# Patient Record
Sex: Female | Born: 2000
Health system: Southern US, Community
[De-identification: ages and names within clinical notes are randomized; demographics above are authoritative.]

## PROBLEM LIST (undated history)

## (undated) ENCOUNTER — Ambulatory Visit (HOSPITAL_COMMUNITY): Admission: EM | Payer: Self-pay

## (undated) DIAGNOSIS — F429 Obsessive-compulsive disorder, unspecified: Secondary | ICD-10-CM

## (undated) DIAGNOSIS — F32A Depression, unspecified: Secondary | ICD-10-CM

## (undated) DIAGNOSIS — F419 Anxiety disorder, unspecified: Secondary | ICD-10-CM

---
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
## 2018-07-21 ENCOUNTER — Ambulatory Visit: Payer: Self-pay | Admitting: Family Medicine

## 2018-08-04 DIAGNOSIS — E781 Pure hyperglyceridemia: Secondary | ICD-10-CM | POA: Insufficient documentation

## 2019-08-18 ENCOUNTER — Other Ambulatory Visit: Payer: Self-pay

## 2019-08-19 ENCOUNTER — Ambulatory Visit: Payer: BC Managed Care – PPO | Admitting: Family Medicine

## 2019-08-19 ENCOUNTER — Ambulatory Visit (INDEPENDENT_AMBULATORY_CARE_PROVIDER_SITE_OTHER): Payer: BC Managed Care – PPO

## 2019-08-19 ENCOUNTER — Encounter: Payer: Self-pay | Admitting: Family Medicine

## 2019-08-19 VITALS — BP 118/70 | HR 78 | Ht 61.0 in | Wt 128.5 lb

## 2019-08-19 DIAGNOSIS — M545 Low back pain: Secondary | ICD-10-CM

## 2019-08-19 DIAGNOSIS — G8929 Other chronic pain: Secondary | ICD-10-CM

## 2019-08-19 NOTE — Patient Instructions (Addendum)
Use heating pad 1-2x/day for 15-20 min on then off Exercises daily after heating pad - see below Take ibuprofen  (3  tab) twice per day x 7-10 days Xray today   Sciatica Rehab Ask your health care provider which exercises are safe for you. Do exercises exactly as told by your health care provider and adjust them as directed. It is normal to feel mild stretching, pulling, tightness, or discomfort as you do these exercises. Stop right away if you feel sudden pain or your pain gets worse. Do not begin these exercises until told by your health care provider. Stretching and range-of-motion exercises These exercises warm up your muscles and joints and improve the movement and flexibility of your hips and back. These exercises also help to relieve pain, numbness, and tingling. Sciatic nerve glide 1. Sit in a chair with your head facing down toward your chest. Place your hands behind your back. Let your shoulders slump forward. 2. Slowly straighten one of your legs while you tilt your head back as if you are looking toward the ceiling. Only straighten your leg as far as you can without making your symptoms worse. 3. Hold this position for __________ seconds. 4. Slowly return to the starting position. 5. Repeat with your other leg. Repeat __________ times. Complete this exercise __________ times a day. Knee to chest with hip adduction and internal rotation  1. Lie on your back on a firm surface with both legs straight. 2. Bend one of your knees and move it up toward your chest until you feel a gentle stretch in your lower back and buttock. Then, move your knee toward the shoulder that is on the opposite side from your leg. This is hip adduction and internal rotation. ? Hold your leg in this position by holding on to the front of your knee. 3. Hold this position for __________ seconds. 4. Slowly return to the starting position. 5. Repeat with your other leg. Repeat __________ times. Complete  this exercise __________ times a day. Prone extension on elbows  1. Lie on your abdomen on a firm surface. A bed may be too soft for this exercise. 2. Prop yourself up on your elbows. 3. Use your arms to help lift your chest up until you feel a gentle stretch in your abdomen and your lower back. ? This will place some of your body weight on your elbows. If this is uncomfortable, try stacking pillows under your chest. ? Your hips should stay down, against the surface that you are lying on. Keep your hip and back muscles relaxed. 4. Hold this position for __________ seconds. 5. Slowly relax your upper body and return to the starting position. Repeat __________ times. Complete this exercise __________ times a day. Strengthening exercises These exercises build strength and endurance in your back. Endurance is the ability to use your muscles for a long time, even after they get tired. Pelvic tilt This exercise strengthens the muscles that lie deep in the abdomen. 1. Lie on your back on a firm surface. Bend your knees and keep your feet flat on the floor. 2. Tense your abdominal muscles. Tip your pelvis up toward the ceiling and flatten your lower back into the floor. ? To help with this exercise, you may place a small towel under your lower back and try to push your back into the towel. 3. Hold this position for __________ seconds. 4. Let your muscles relax completely before you repeat this exercise. Repeat __________ times. Complete this  exercise __________ times a day. Alternating arm and leg raises  1. Get on your hands and knees on a firm surface. If you are on a hard floor, you may want to use padding, such as an exercise mat, to cushion your knees. 2. Line up your arms and legs. Your hands should be directly below your shoulders, and your knees should be directly below your hips. 3. Lift your left leg behind you. At the same time, raise your right arm and straighten it in front of  you. ? Do not lift your leg higher than your hip. ? Do not lift your arm higher than your shoulder. ? Keep your abdominal and back muscles tight. ? Keep your hips facing the ground. ? Do not arch your back. ? Keep your balance carefully, and do not hold your breath. 4. Hold this position for __________ seconds. 5. Slowly return to the starting position. 6. Repeat with your right leg and your left arm. Repeat __________ times. Complete this exercise __________ times a day. Posture and body mechanics Good posture and healthy body mechanics can help to relieve stress in your body's tissues and joints. Body mechanics refers to the movements and positions of your body while you do your daily activities. Posture is part of body mechanics. Good posture means:  Your spine is in its natural S-curve position (neutral).  Your shoulders are pulled back slightly.  Your head is not tipped forward. Follow these guidelines to improve your posture and body mechanics in your everyday activities. Standing   When standing, keep your spine neutral and your feet about hip width apart. Keep a slight bend in your knees. Your ears, shoulders, and hips should line up.  When you do a task in which you stand in one place for a long time, place one foot up on a stable object that is 2-4 inches (5-10 cm) high, such as a footstool. This helps keep your spine neutral. Sitting   When sitting, keep your spine neutral and keep your feet flat on the floor. Use a footrest, if necessary, and keep your thighs parallel to the floor. Avoid rounding your shoulders, and avoid tilting your head forward.  When working at a desk or a computer, keep your desk at a height where your hands are slightly lower than your elbows. Slide your chair under your desk so you are close enough to maintain good posture.  When working at a computer, place your monitor at a height where you are looking straight ahead and you do not have to tilt  your head forward or downward to look at the screen. Resting  When lying down and resting, avoid positions that are most painful for you.  If you have pain with activities such as sitting, bending, stooping, or squatting, lie in a position in which your body does not bend very much. For example, avoid curling up on your side with your arms and knees near your chest (fetal position).  If you have pain with activities such as standing for a long time or reaching with your arms, lie with your spine in a neutral position and bend your knees slightly. Try the following positions: ? Lying on your side with a pillow between your knees. ? Lying on your back with a pillow under your knees. Lifting   When lifting objects, keep your feet at least shoulder width apart and tighten your abdominal muscles.  Bend your knees and hips and keep your spine neutral. It is  important to lift using the strength of your legs, not your back. Do not lock your knees straight out.  Always ask for help to lift heavy or awkward objects. This information is not intended to replace advice given to you by your health care provider. Make sure you discuss any questions you have with your health care provider. Document Released: 10/08/2005 Document Revised: 01/30/2019 Document Reviewed: 10/30/2018 Elsevier Patient Education  2020 [employer].

## 2019-08-19 NOTE — Progress Notes (Signed)
Eileen Rivas is a 18 y.o. female  Chief Complaint  Patient presents with  . Establish Care  . Back Pain    HPI: Eileen Rivas is a 18 y.o. female here as a new patient to establish care with our office. She is accompanied by her mother. Previous PCP at [facility].  Pt complains of 2 year h/o left low back pain. Pain started when patient tried to pick an elderly female woman whom her grandmother was caring for up off of the ground. She describes pain as a dull stabbing pain. Pain is worse with heavy lifting or the more active patient is. Pain does not keep pt from sleeping or interfere with ADL's.  No numbness or tingling in Lt leg. No LLE weakness. No falls. She has taken ibuprofen as needed, mostly when she was working last year for 2 mo at [employer].  She has not seen provider for this.   History reviewed. No pertinent past medical history.  History reviewed. No pertinent surgical history.  Social History   Socioeconomic History  . Marital status: Single    Spouse name: Not on file  . Number of children: Not on file  . Years of education: Not on file  . Highest education level: Not on file  Occupational History  . Not on file  Social Needs  . Financial resource strain: Not on file  . Food insecurity    Worry: Not on file    Inability: Not on file  . Transportation needs    Medical: Not on file    Non-medical: Not on file  Tobacco Use  . Smoking status: Never Smoker  . Smokeless tobacco: Never Used  Substance and Sexual Activity  . Alcohol use: No  . Drug use: No  . Sexual activity: Not on file  Lifestyle  . Physical activity    Days per week: Not on file    Minutes per session: Not on file  . Stress: Not on file  Relationships  . Social Musician on phone: Not on file    Gets together: Not on file    Attends religious service: Not on file    Active member of club or organization: Not on file    Attends meetings of clubs or  organizations: Not on file    Relationship status: Not on file  . Intimate partner violence    Fear of current or ex partner: Not on file    Emotionally abused: Not on file    Physically abused: Not on file    Forced sexual activity: Not on file  Other Topics Concern  . Not on file  Social History Narrative  . Not on file    History reviewed. No pertinent family history.    There is no immunization history on file for this patient.  No outpatient encounter medications on file as of 08/19/2019.   No facility-administered encounter medications on file as of 08/19/2019.      ROS: Pertinent positives and negatives noted in HPI. Remainder of ROS non-contributory   No Known Allergies  BP 118/70   Pulse 78   Ht 5\' 1"  (1.549 m)   Wt 128 lb 8 oz (58.3 kg)   SpO2 97%   BMI 24.28 kg/m   Physical Exam  Constitutional: She is oriented to person, place, and time. She appears well-developed and well-nourished. No distress.  Abdominal: Soft. Bowel sounds are normal. She exhibits no distension. There is no  abdominal tenderness. There is no CVA tenderness.  Musculoskeletal: Normal range of motion.        General: No edema.     Comments: Back: normal and full ROM, neg SLR B/L, + TTP over Lt sciatic notch, no palpable muscle hypertonicity, no gross abnormalities on inspection  Neurological: She is alert and oriented to person, place, and time. She has normal reflexes. She exhibits normal muscle tone. Coordination normal.  Psychiatric: She has a normal mood and affect. Her behavior is normal.     A/P:  1. Chronic left-sided low back pain without sciatica - heating pad BID - exercises daily - see AVS - ibuprofen 600mg  BID with food x 7-10 days - DG Lumbar Spine Complete; Future - DG Sacrum/Coccyx; Future - f/u in 3-4 wks if no/minimal improvement in symptoms Discussed plan and reviewed medications with patient, including risks, benefits, and potential side effects. Pt expressed  understand. All questions answered.

## 2019-08-20 ENCOUNTER — Encounter (HOSPITAL_COMMUNITY): Payer: Self-pay

## 2019-08-20 ENCOUNTER — Emergency Department (HOSPITAL_COMMUNITY)
Admission: EM | Admit: 2019-08-20 | Discharge: 2019-08-20 | Disposition: A | Discharge status: Home / Self Care | Payer: BC Managed Care – PPO | Attending: Emergency Medicine | Admitting: Emergency Medicine

## 2019-08-20 ENCOUNTER — Other Ambulatory Visit: Payer: Self-pay

## 2019-08-20 DIAGNOSIS — R519 Headache, unspecified: Secondary | ICD-10-CM | POA: Insufficient documentation

## 2019-08-20 DIAGNOSIS — Z20828 Contact with and (suspected) exposure to other viral communicable diseases: Secondary | ICD-10-CM | POA: Diagnosis not present

## 2019-08-20 MED ORDER — IBUPROFEN 400 MG PO TABS: 400.0000 mg | ORAL_TABLET | Freq: Four times a day (QID) | ORAL | 0 refills | Status: DC | PRN

## 2019-08-20 MED ORDER — ACETAMINOPHEN 325 MG PO TABS: 650.0000 mg | ORAL_TABLET | Freq: Once | ORAL | Status: DC

## 2019-08-20 NOTE — ED Triage Notes (Signed)
Pt reports sore throat and right sided headache that started today.

## 2019-08-20 NOTE — Discharge Instructions (Addendum)
We saw you in the ER for headaches. We are not sure what is causing your headaches, however, there appears to be no evidence of infection, bleeds or tumors based on our exam and results.  Please take motrin round the clock for the next 48 hours, and take other meds prescribed only for break through pain. See your doctor if the pain persists, as you might need better medications or a specialist.  Please return to the ER if the headache gets severe and in not improving, you have associated new one sided numbness, tingling, weakness or confusion, seizures, poor balance or poor vision.

## 2019-08-20 NOTE — ED Notes (Signed)
Pt dc'd home w/all belongings, a/o x4, ambulatory upon dc 

## 2019-08-21 ENCOUNTER — Ambulatory Visit: Payer: Self-pay

## 2019-08-21 NOTE — ED Provider Notes (Signed)
[facility] EMERGENCY DEPARTMENT Provider Note   CSN: [id number] Arrival date & time: 08/20/19  2053     History   Chief Complaint Chief Complaint  Patient presents with  . Sore Throat  . Headache    HPI Eileen Rivas is a 18 y.o. female.     HPI 18 year old comes in a chief complaint of sore throat and headache.  Patient has no significant medical history.  She reports that she has been having headache off and on for the last 3 days.  She is also having sore throat with it.  Patient denies any cough, chest pain, shortness of breath, body aches, nausea, vomiting, fevers, chills.  There is no associated focal numbness, weakness with a headache.  Family history is negative for any aneurysm, brain bleed or tumor.  Her headache is not positional, exertional and described as 8 out of 10 pain at its peak which last for about 1 to 2 hours.  Patient denies any substance abuse.  She denies any sick contacts.  History reviewed. No pertinent past medical history.  There are no active problems to display for this patient.   History reviewed. No pertinent surgical history.   OB History   No obstetric history on file.      Home Medications    Prior to Admission medications   Medication Sig Start Date End Date Taking? Authorizing Provider  ibuprofen (ADVIL) 400 MG tablet Take 1 tablet (400 mg total) by mouth every 6 (six) hours as needed. 08/20/19   Derwood Kaplan, MD    Family History No family history on file.  Social History Social History   Tobacco Use  . Smoking status: Never Smoker  . Smokeless tobacco: Never Used  Substance Use Topics  . Alcohol use: No  . Drug use: No     Allergies   Patient has no known allergies.   Review of Systems Review of Systems  Constitutional: Negative for activity change.  HENT: Positive for sore throat.   Respiratory: Negative for cough and shortness of breath.   Cardiovascular: Negative for chest pain.   Gastrointestinal: Negative for nausea and vomiting.  Neurological: Positive for headaches.     Physical Exam Updated Vital Signs BP 92/60   Pulse 67   Temp 98.5 F (36.9 C) (Oral)   Resp 16   LMP 08/06/2019   SpO2 100%   Physical Exam Vitals signs and nursing note reviewed.  Constitutional:      Appearance: She is well-developed.  HENT:     Head: Normocephalic and atraumatic.     Mouth/Throat:     Tonsils: No tonsillar exudate or tonsillar abscesses.  Neck:     Musculoskeletal: Normal range of motion and neck supple.  Cardiovascular:     Rate and Rhythm: Normal rate.     Heart sounds: No murmur.  Pulmonary:     Effort: Pulmonary effort is normal.  Abdominal:     General: Bowel sounds are normal.  Skin:    General: Skin is warm and dry.  Neurological:     General: No focal deficit present.     Mental Status: She is alert and oriented to person, place, and time.     Comments: Cerebellar exam is normal (finger to nose) Sensory exam normal for bilateral upper and lower extremities - and patient is able to discriminate between sharp and dull. Motor exam is 4+/5       ED Treatments / Results  Labs (all labs ordered  are listed, but only abnormal results are displayed) Labs Reviewed - No data to display  EKG None  Radiology No results found.  Procedures Procedures (including critical care time)  Medications Ordered in ED Medications - No data to display   Initial Impression / Assessment and Plan / ED Course  I have reviewed the triage vital signs and the nursing notes.  Pertinent labs & imaging results that were available during my care of the patient were reviewed by me and considered in my medical decision making (see chart for details).        18 year old comes in a chief complaint of intermittent headaches.  She has not taken any medications for the headache, and history is not overly concerning as the symptoms are intermittent, with no specific  evoking, aggravating or relieving factors.  She has no red flags in the history suggesting elevated ICP.  Family history and social history are also reassuring.  Neuro exam is nonfocal.  We checked patient's throat, and there is no exudate, erythema.  I do not think patient has strep throat.  We discussed COVID-19 testing but patient reports that she has been staying home and her family members have gone in and out to work but not been exposed to anyone with COVID-19.  She does not want to be tested.  Eileen Rivas was evaluated in Emergency Department on 08/21/2019 for the symptoms described in the history of present illness. She was evaluated in the context of the global COVID-19 pandemic, which necessitated consideration that the patient might be at risk for infection with the SARS-CoV-2 virus that causes COVID-19. Institutional protocols and algorithms that pertain to the evaluation of patients at risk for COVID-19 are in a state of rapid change based on information released by regulatory bodies including the CDC and federal and state organizations. These policies and algorithms were followed during the patient's care in the ED.   Final Clinical Impressions(s) / ED Diagnoses   Final diagnoses:  Intermittent headache    ED Discharge Orders         Ordered    ibuprofen (ADVIL) 400 MG tablet  Every 6 hours PRN     08/20/19 2200           Varney Biles, MD 08/21/19 2214

## 2019-08-21 NOTE — Telephone Encounter (Signed)
Patient called stating that yesterday she woke up and had pain,pin and needles and sharp to her rt face jaw head and throat.  She states that she went to the ER but they sent her home.  She denies swelling or rash to the right side of her face. She does not have swollen lymph nodes. She denies chest pain.She denies tooth pain. Mother is also on call and she states that she has just recently been seen in office for back pain. Patient states she had ibuprofen 400mg  she is taking for pain. Care advice read to patient. Appointment scheduled for Saturday clinic. Patient understands that she will be contacted in the AM with instructions for the visit. Patient verbalized understanding of all information.  Reason for Disposition . Face pain present > 24 hours  Answer Assessment - Initial Assessment Questions 1. ONSET: "When did the pain start?" (e.g., minutes, hours, days)     yesterday 2. ONSET: "Does the pain come and go, or has it been constant since it started?" (e.g., constant, intermittent, fleeting)    Pain comes and goes jaw and throat are constant 3. SEVERITY: "How bad is the pain?"   (Scale 1-10; mild, moderate or severe)   - MILD (1-3): doesn't interfere with normal activities    - MODERATE (4-7): interferes with normal activities or awakens from sleep    - SEVERE (8-10): excruciating pain, unable to do any normal activities      8 to head but quick face pain 7 and last 1 minute jaw and throat when swallow 4. LOCATION: "Where does it hurt?"      Rt face jaw throat head 5. RASH: "Is there any redness, rash, or swelling of the face?"    denies 6. FEVER: "Do you have a fever?" If so, ask: "What is it, how was it measured, and when did it start?"     denies 7. OTHER SYMPTOMS: "Do you have any other symptoms?" (e.g., fever, toothache, nasal discharge, nasal congestion, clicking sensation in jaw joint)    none 8. PREGNANCY: "Is there any chance you are pregnant?" "When was your last menstrual  period?"     2weeks ago  Protocols used: FACE PAIN-A-AH

## 2019-08-22 ENCOUNTER — Encounter: Payer: Self-pay | Admitting: Family Medicine

## 2019-08-22 ENCOUNTER — Ambulatory Visit (INDEPENDENT_AMBULATORY_CARE_PROVIDER_SITE_OTHER): Payer: BC Managed Care – PPO | Admitting: Family Medicine

## 2019-08-22 VITALS — Ht 61.0 in

## 2019-08-22 DIAGNOSIS — G500 Trigeminal neuralgia: Secondary | ICD-10-CM | POA: Diagnosis not present

## 2019-08-22 MED ORDER — PREDNISONE 20 MG PO TABS: 20.0000 mg | ORAL_TABLET | Freq: Every day | ORAL | 0 refills | Status: DC

## 2019-08-22 NOTE — Progress Notes (Signed)
   VIRTUAL VISIT VIA VIDEO  I connected with Eileen Rivas on 08/22/19 at  9:00 AM EDT by a video enabled telemedicine application and verified that I am speaking with the correct person using two identifiers. Location patient: Home Location provider: [place] participating in the virtual visit: Patient, Dr. Raoul Pitch and R.Baker, LPN  I discussed the limitations of evaluation and management by telemedicine and the availability of in person appointments. The patient expressed understanding and agreed to proceed.   SUBJECTIVE Chief Complaint  Patient presents with  . Facial Pain    Right sided facial pain x1 day. Denies injury.     HPI: Eileen Rivas is a 18 y.o. female present at after hours clinic for right sided facial pain. Patient denies injury. She states she has never had a pain like this before. The pain started 3 days ago. She feels it is a little better today, but still present. She reports the pain is like a flash of pain that is intermittent. It is not painful to touch the area or open or her jaw. She denies rash, but reports it hurt to brush her hair over the temporal aspect on the right. She denies teeth pain, gum swelling, fever, chills, ear pain, sore throat, headache, nausea or vomit. She does have braces and reports tightening 2 weeks ago. She denies any cuts or sores in her mouth.   ROS: See pertinent positives and negatives per HPI.  There are no active problems to display for this patient.   Social History   Tobacco Use  . Smoking status: Never Smoker  . Smokeless tobacco: Never Used  Substance Use Topics  . Alcohol use: No    Current Outpatient Medications:  .  ibuprofen (ADVIL) 400 MG tablet, Take 1 tablet (400 mg total) by mouth every 6 (six) hours as needed., Disp: 20 tablet, Rfl: 0  No Known Allergies  OBJECTIVE: LMP 08/06/2019  Gen: No acute distress. Nontoxic in appearance.  HENT: AT. [place].  MMM.  Eyes:Pupils Equal Round Reactive to  light, Extraocular movements intact,  Conjunctiva without redness, discharge or icterus. Chest: no  Cough or shortness of breath Skin: no rashes, purpura or petechiae. No erythema. Possibly mild swelling right cheek vs facial natural asymmetry. Full extension of jaw without popping, deviation or pain. Not TTP over cheek.   Neuro:  Alert. Oriented x3   ASSESSMENT AND PLAN: Eileen Rivas is a 18 y.o. female present for  Trigeminal neuralgia Pt symptoms are consistent with trigeminal neuralgia. She has no infectious signs today and no rash. Questioned possibly mild fullness below zygomatic arch, however pt states she feels her face appears larger on that side> poss natural facial asymmetry. Discussed trigiminal neuralgia and use of prednisone for 5 days.  Tylenol/advil OTC for discomfort.  Discussed monitoring for any rash development> in the event this could be shingles, and report immediately to be seen if so. Pt reported understanding.  F/U with pcp next week.   > 15 minutes spent with patient, > 50% of that time face to face    Howard Pouch, DO 08/22/2019

## 2019-08-22 NOTE — Patient Instructions (Signed)
I have called in the prednisone 5 day course.  Use advil for pain.  Call your doctor on Monday to follow up. Follow up sooner if you notice a rash appear.   Trigeminal Neuralgia  Trigeminal neuralgia is a nerve disorder that causes severe pain on one side of the face. The pain may last from a few seconds to several minutes. The pain is usually only on one side of the face. Symptoms may occur for days, weeks, or months and then go away for months or years. The pain may return and be worse than before. What are the causes? This condition is caused by damage or pressure to a nerve in the head that is called the trigeminal nerve. An attack can be triggered by:  Talking.  Chewing.  Putting on makeup.  Washing your face.  Shaving your face.  Brushing your teeth.  Touching your face. What increases the risk? You are more likely to develop this condition if you:  Are 29 years of age or older.  Are female. What are the signs or symptoms? The main symptom of this condition is severe pain in the:  Jaw.  Lips.  Eyes.  Nose.  Scalp.  Forehead.  Face. The pain may be:  Intense.  Stabbing.  Electric.  Shock-like. How is this diagnosed? This condition is diagnosed with a physical exam. A CT scan or an MRI may be done to rule out other conditions that can cause facial pain. How is this treated? This condition may be treated with:  Avoiding the things that trigger your symptoms.  Taking prescription medicines (anticonvulsants).  Having surgery. This may be done in severe cases if other medical treatment does not provide relief.  Having procedures such as ablation, thermal, or radiation therapy. It may take up to one month for treatment to start relieving the pain. Follow these instructions at home: Managing pain  Learn as much as you can about how to manage your pain. Ask your health care provider if a pain specialist would be helpful.  Consider talking with a  mental health care provider (psychologist) about how to cope with the pain.  Consider joining a pain support group. General instructions  Take over-the-counter and prescription medicines only as told by your health care provider.  Avoid the things that trigger your symptoms. It may help to: ? Chew on the unaffected side of your mouth. ? Avoid touching your face. ? Avoid blasts of hot or cold air.  Follow your treatment plan as told by your health care provider. This may include: ? Cognitive or behavioral therapy. ? Gentle, regular exercise. ? Meditation or yoga. ? Aromatherapy.  Keep all follow-up visits as told by your health care provider. You may need to be monitored closely to make sure treatment is working well for you. Where to find more information  Facial Pain Association: fpa-support.org Contact a health care provider if:  Your medicine is not helping your symptoms.  You have side effects from the medicine used for treatment.  You develop new, unexplained symptoms, such as: ? Double vision. ? Facial weakness. ? Facial numbness. ? Changes in hearing or balance.  You feel depressed. Get help right away if:  Your pain is severe and is not getting better.  You develop suicidal thoughts. If you ever feel like you may hurt yourself or others, or have thoughts about taking your own life, get help right away. You can go to your nearest emergency department or call:  Your  local emergency services (911 in the [place]  A suicide crisis helpline, such as the [place] at 650-734-4243. This is open 24 hours a day. Summary  Trigeminal neuralgia is a nerve disorder that causes severe pain on one side of the face. The pain may last from a few seconds to several minutes.  This condition is caused by damage or pressure to a nerve in the head that is called the trigeminal nerve.  Treatment may include avoiding the things that trigger your symptoms,  taking medicines, or having surgery or procedures. It may take up to one month for treatment to start relieving the pain.  Avoid the things that trigger your symptoms.  Keep all follow-up visits as told by your health care provider. You may need to be monitored closely to make sure treatment is working well for you. This information is not intended to replace advice given to you by your health care provider. Make sure you discuss any questions you have with your health care provider. Document Released: 10/05/2000 Document Revised: 08/25/2018 Document Reviewed: 08/25/2018 Elsevier Patient Education  2020 [employer].

## 2019-09-15 ENCOUNTER — Other Ambulatory Visit: Payer: Self-pay

## 2019-09-15 ENCOUNTER — Encounter: Payer: Self-pay | Admitting: Family Medicine

## 2019-09-15 ENCOUNTER — Ambulatory Visit (INDEPENDENT_AMBULATORY_CARE_PROVIDER_SITE_OTHER): Payer: BC Managed Care – PPO | Admitting: Family Medicine

## 2019-09-15 VITALS — Ht 61.0 in

## 2019-09-15 DIAGNOSIS — N3090 Cystitis, unspecified without hematuria: Secondary | ICD-10-CM

## 2019-09-15 DIAGNOSIS — R300 Dysuria: Secondary | ICD-10-CM

## 2019-09-15 LAB — POCT URINALYSIS DIPSTICK
Bilirubin, UA: NEGATIVE
Blood, UA: NEGATIVE
Glucose, UA: NEGATIVE
Ketones, UA: NEGATIVE
Leukocytes, UA: NEGATIVE
Nitrite, UA: POSITIVE
Protein, UA: NEGATIVE
Spec Grav, UA: 1.025 (ref 1.010–1.025)
Urobilinogen, UA: 0.2 E.U./dL
pH, UA: 6 (ref 5.0–8.0)

## 2019-09-15 MED ORDER — CEPHALEXIN 500 MG PO CAPS: 500.0000 mg | ORAL_CAPSULE | Freq: Two times a day (BID) | ORAL | 0 refills | Status: AC

## 2019-09-15 NOTE — Progress Notes (Signed)
Eileen Rivas - 18 y.o. female MRN [id number]  Date of birth: 07-30-2001   This visit type was conducted due to national recommendations for restrictions regarding the COVID-19 Pandemic (e.g. social distancing).  This format is felt to be most appropriate for this patient at this time.  All issues noted in this document were discussed and addressed.  No physical exam was performed (except for noted visual exam findings with Video Visits).  I discussed the limitations of evaluation and management by telemedicine and the availability of in person appointments. The patient expressed understanding and agreed to proceed.  I connected with@ on 09/15/19 at  3:00 PM EST by a video enabled telemedicine application and verified that I am speaking with the correct person using two identifiers.  Present at visit: Luetta Nutting, DO Julieta Gutting   Patient [place] [address]   Provider location:   [place]  Chief Complaint  Patient presents with  . Urinary Tract Infection    pt is having lower abdominal pain & pain after urinating for about 3 days.    HPI  Eileen Rivas is a 18 y.o. female who presents via audio/video conferencing for a telehealth visit today.  She has complaint of dysuria and lower abdominal pain.  She also reports urinary frequency. Her symptoms started ~3 days ago.  She denies flank pain, fever, nausea, hematuria, vaginal discharge or abnormal bleeding.  She has had a UTI in the past and symptoms are similar.    ROS:  A comprehensive ROS was completed and negative except as noted per HPI  History reviewed. No pertinent past medical history.  History reviewed. No pertinent surgical history.  History reviewed. No pertinent family history.  Social History   Socioeconomic History  . Marital status: Single    Spouse name: Not on file  . Number of children: Not on file  . Years of education: Not on file  . Highest  education level: Not on file  Occupational History  . Not on file  Social Needs  . Financial resource strain: Not on file  . Food insecurity    Worry: Not on file    Inability: Not on file  . Transportation needs    Medical: Not on file    Non-medical: Not on file  Tobacco Use  . Smoking status: Never Smoker  . Smokeless tobacco: Never Used  Substance and Sexual Activity  . Alcohol use: No  . Drug use: No  . Sexual activity: Not on file  Lifestyle  . Physical activity    Days per week: Not on file    Minutes per session: Not on file  . Stress: Not on file  Relationships  . Social Herbalist on phone: Not on file    Gets together: Not on file    Attends religious service: Not on file    Active member of club or organization: Not on file    Attends meetings of clubs or organizations: Not on file    Relationship status: Not on file  . Intimate partner violence    Fear of current or ex partner: Not on file    Emotionally abused: Not on file    Physically abused: Not on file    Forced sexual activity: Not on file  Other Topics Concern  . Not on file  Social History Narrative  . Not on file     Current Outpatient Medications:  .  ibuprofen (  ADVIL) 400 MG tablet, Take 1 tablet (400 mg total) by mouth every 6 (six) hours as needed., Disp: 20 tablet, Rfl: 0 .  predniSONE (DELTASONE) 20 MG tablet, Take 1 tablet (20 mg total) by mouth daily with breakfast., Disp: 10 tablet, Rfl: 0 .  cephALEXin (KEFLEX) 500 MG capsule, Take 1 capsule (500 mg total) by mouth 2 (two) times daily for 7 days., Disp: 14 capsule, Rfl: 0  EXAM:  VITALS per patient if applicable: Ht 5\' 1"  (1.549 m)   BMI 24.28 kg/m   GENERAL: alert, oriented, appears well and in no acute distress  HEENT: atraumatic, conjunttiva clear, no obvious abnormalities on inspection of external nose and ears  NECK: normal movements of the head and neck  LUNGS: on inspection no signs of respiratory distress,  breathing rate appears normal, no obvious gross SOB, gasping or wheezing  CV: no obvious cyanosis  MS: moves all visible extremities without noticeable abnormality  PSYCH/NEURO: pleasant and cooperative, no obvious depression or anxiety, speech and thought processing grossly intact  ASSESSMENT AND PLAN:  Discussed the following assessment and plan:  Cystitis -She will stop by the clinic to provide urine sample to send for culture.  Denies other symptoms suggestive of STI.  -Will cover empirically with cephalexin.  Recommend increased fluid intake.   -She is instructed to call if she develops new or worsening symptoms.      I discussed the assessment and treatment plan with the patient. The patient was provided an opportunity to ask questions and all were answered. The patient agreed with the plan and demonstrated an understanding of the instructions.   The patient was advised to call back or seek an in-person evaluation if the symptoms worsen or if the condition fails to improve as anticipated.    , DO

## 2019-09-15 NOTE — Assessment & Plan Note (Signed)
-  She will stop by the clinic to provide urine sample to send for culture.  Denies other symptoms suggestive of STI.  -Will cover empirically with cephalexin.  Recommend increased fluid intake.   -She is instructed to call if she develops new or worsening symptoms.

## 2019-09-15 NOTE — Addendum Note (Signed)
Addended by: Lynnea Ferrier on: 09/15/2019 04:43 PM   Modules accepted: Orders

## 2019-09-16 ENCOUNTER — Telehealth: Payer: Self-pay | Admitting: Family Medicine

## 2019-09-16 NOTE — Telephone Encounter (Signed)
Copied from [place] [id number]. Topic: General - Other >> Sep 16, 2019 12:42 PM Keene Breath wrote: Reason for CRM: Mother called to get the results of the patient's urine test.  Please call at [phone]

## 2019-09-18 LAB — URINE CULTURE
MICRO NUMBER:: 1134709
SPECIMEN QUALITY:: ADEQUATE

## 2019-09-21 NOTE — Progress Notes (Signed)
Please let patient know that she should complete her current antibiotic.  If symptoms persist let us know.

## 2019-09-24 ENCOUNTER — Ambulatory Visit: Payer: Medicaid Other | Admitting: Internal Medicine

## 2020-05-02 ENCOUNTER — Encounter (HOSPITAL_COMMUNITY): Payer: Self-pay

## 2020-05-02 ENCOUNTER — Ambulatory Visit (HOSPITAL_COMMUNITY)
Admission: EM | Admit: 2020-05-02 | Discharge: 2020-05-03 | Disposition: A | Discharge status: Other Acute Inpatient Hospital | Payer: No Payment, Other | Attending: Psychiatry | Admitting: Psychiatry

## 2020-05-02 ENCOUNTER — Other Ambulatory Visit: Payer: Self-pay

## 2020-05-02 DIAGNOSIS — F22 Delusional disorders: Secondary | ICD-10-CM | POA: Insufficient documentation

## 2020-05-02 DIAGNOSIS — F422 Mixed obsessional thoughts and acts: Secondary | ICD-10-CM | POA: Insufficient documentation

## 2020-05-02 DIAGNOSIS — Z20822 Contact with and (suspected) exposure to covid-19: Secondary | ICD-10-CM | POA: Insufficient documentation

## 2020-05-02 DIAGNOSIS — F419 Anxiety disorder, unspecified: Secondary | ICD-10-CM | POA: Insufficient documentation

## 2020-05-02 DIAGNOSIS — F429 Obsessive-compulsive disorder, unspecified: Secondary | ICD-10-CM

## 2020-05-02 HISTORY — DX: Anxiety disorder, unspecified: F41.9

## 2020-05-02 HISTORY — DX: Depression, unspecified: F32.A

## 2020-05-02 LAB — COMPREHENSIVE METABOLIC PANEL
ALT: 10 U/L (ref 0–44)
AST: 21 U/L (ref 15–41)
Albumin: 3.9 g/dL (ref 3.5–5.0)
Alkaline Phosphatase: 74 U/L (ref 38–126)
Anion gap: 9 (ref 5–15)
BUN: 6 mg/dL (ref 6–20)
CO2: 20 mmol/L — ABNORMAL LOW (ref 22–32)
Calcium: 8.9 mg/dL (ref 8.9–10.3)
Chloride: 108 mmol/L (ref 98–111)
Creatinine, Ser: 0.48 mg/dL (ref 0.44–1.00)
GFR calc Af Amer: >60 mL/min (ref >60)
GFR calc non Af Amer: >60 mL/min (ref >60)
Glucose, Bld: 94 mg/dL (ref 70–99)
Potassium: 3.6 mmol/L (ref 3.5–5.1)
Sodium: 137 mmol/L (ref 135–145)
Total Bilirubin: 0.5 mg/dL (ref 0.3–1.2)
Total Protein: 7.2 g/dL (ref 6.5–8.1)

## 2020-05-02 LAB — CBC WITH DIFFERENTIAL/PLATELET
Abs Immature Granulocytes: 0.02 10*3/uL (ref 0.00–0.07)
Basophils Absolute: 0 10*3/uL (ref 0.0–0.1)
Basophils Relative: 0 %
Eosinophils Absolute: 0.1 10*3/uL (ref 0.0–0.5)
Eosinophils Relative: 1 %
HCT: 38 % (ref 36.0–46.0)
Hemoglobin: 11.9 g/dL — ABNORMAL LOW (ref 12.0–15.0)
Immature Granulocytes: 0 %
Lymphocytes Relative: 28 %
Lymphs Abs: 2.1 10*3/uL (ref 0.7–4.0)
MCH: 27.4 pg (ref 26.0–34.0)
MCHC: 31.3 g/dL (ref 30.0–36.0)
MCV: 87.4 fL (ref 80.0–100.0)
Monocytes Absolute: 0.7 10*3/uL (ref 0.1–1.0)
Monocytes Relative: 10 %
Neutro Abs: 4.5 10*3/uL (ref 1.7–7.7)
Neutrophils Relative %: 61 %
Platelets: 278 10*3/uL (ref 150–400)
RBC: 4.35 MIL/uL (ref 3.87–5.11)
RDW: 14 % (ref 11.5–15.5)
WBC: 7.5 10*3/uL (ref 4.0–10.5)
nRBC: 0 % (ref 0.0–0.2)

## 2020-05-02 LAB — POCT URINE DRUG SCREEN - MANUAL ENTRY (I-SCREEN)
POC Amphetamine UR: NOT DETECTED
POC Buprenorphine (BUP): NOT DETECTED
POC Cocaine UR: NOT DETECTED
POC Marijuana UR: NOT DETECTED
POC Methadone UR: NOT DETECTED
POC Methamphetamine UR: NOT DETECTED
POC Morphine: NOT DETECTED
POC Oxazepam (BZO): NOT DETECTED
POC Oxycodone UR: NOT DETECTED
POC Secobarbital (BAR): NOT DETECTED

## 2020-05-02 LAB — SARS CORONAVIRUS 2 BY RT PCR (HOSPITAL ORDER, PERFORMED IN ~~LOC~~ HOSPITAL LAB): SARS Coronavirus 2: NEGATIVE

## 2020-05-02 LAB — ETHANOL: Alcohol, Ethyl (B): <10 mg/dL (ref <10)

## 2020-05-02 LAB — POC SARS CORONAVIRUS 2 AG: SARS Coronavirus 2 Ag: NEGATIVE

## 2020-05-02 LAB — POCT PREGNANCY, URINE: Preg Test, Ur: NEGATIVE

## 2020-05-02 LAB — ACETAMINOPHEN LEVEL: Acetaminophen (Tylenol), Serum: <10 ug/mL — ABNORMAL LOW (ref 10–30)

## 2020-05-02 LAB — TSH: TSH: 10.615 u[IU]/mL — ABNORMAL HIGH (ref 0.350–4.500)

## 2020-05-02 LAB — POC SARS CORONAVIRUS 2 AG -  ED: SARS Coronavirus 2 Ag: NEGATIVE

## 2020-05-02 LAB — SALICYLATE LEVEL: Salicylate Lvl: <7 mg/dL — ABNORMAL LOW (ref 7.0–30.0)

## 2020-05-02 NOTE — ED Notes (Signed)
None

## 2020-05-02 NOTE — ED Notes (Signed)
Report given to Zada Finders at [facility]

## 2020-05-02 NOTE — ED Provider Notes (Deleted)
Behavioral Health Admission H&P [facility])  Date: 05/02/20 Patient Name: Eileen Rivas MRN: [id number] Chief Complaint:  Chief Complaint  Patient presents with  . Anxiety    OCD symptoms   Chief Complaint/Presenting Problem: Patient reports worsening anxiety,  intrusive thoughts and obsessive preoccupation with preventing harm to others and herself.  Diagnoses:  Final diagnoses:  None    HPI: Patient is a 19 year old female with no past psychiatric history.  Patient presents with severe anxiety and religious preoccupation with obsessive-compulsive thoughts.  Patient presented voluntarily to the [facility] and is voluntarily agreeing to be admitted for treatment.  Patient reports that she has never had anything like this happening until approximately 3 to 4 months ago.  She reports that she will have these intrusive thoughts of bad things that will happen to herself or to someone in her family and she will have to say a prayer to contradict that thoughts and so that does not happen to anyone else.  Patient reports that she has not been sleeping and that she has had a very poor appetite and has lost weight.  She also reports that she has not been bathing due to the intrusive thoughts preventing her from getting in the shower.  During the evaluation patient continually has constant thought of fear with relation to various problems.  Review TTS note for specific example that happened during their evaluation.  Patient is distressed and at times will become tearful.  She stated she was a fear of speaking to her mother about being admitted to the hospital because it would be too overwhelming to her.  Patient also spoke about a "blood covenant" when discussing getting lab work done and stating that she cannot look at the blood when it is drawn.  However patient did agree to have her blood work done and would look the other way so that she did not have to see the blood.  PHQ 2-9:     Total Time spent with  patient: 30 minutes  Musculoskeletal  Strength & Muscle Tone: within normal limits Gait & Station: normal Patient leans: N/A  Psychiatric Specialty Exam  Presentation General Appearance: Casual  Eye Contact:Fair  Speech:Clear and Coherent  Speech Volume:Decreased  Handedness:Right   Mood and Affect  Mood:Anxious  Affect:Constricted   Thought Process  Thought Processes:Disorganized  Descriptions of Associations:Circumstantial  Orientation:Full (Time, Place and Person)  Thought Content:Obsessions;Paranoid Ideation  Hallucinations:Hallucinations: None  Ideas of Reference:Paranoia  Suicidal Thoughts:Suicidal Thoughts: No  Homicidal Thoughts:Homicidal Thoughts: No   Sensorium  Memory:Immediate Fair;Recent Fair;Remote Fair  Judgment:Impaired  Insight:Lacking   Executive Functions  Concentration:Fair  Attention Span:Fair  Recall:Fair  Fund of Knowledge:Fair  Language:Fair   Psychomotor Activity  Psychomotor Activity:Psychomotor Activity: Normal   Assets  Assets:Desire for Improvement;Financial Resources/Insurance;Housing;Social Support;Physical Health;Transportation   Sleep  Sleep:Sleep: Poor   Physical Exam Vitals and nursing note reviewed.  Constitutional:      Appearance: She is well-developed.  Cardiovascular:     Rate and Rhythm: Normal rate.  Pulmonary:     Effort: Pulmonary effort is normal.  Musculoskeletal:        General: Normal range of motion.  Skin:    General: Skin is warm.  Neurological:     Mental Status: She is alert and oriented to person, place, and time.  Psychiatric:        Mood and Affect: Mood is anxious.        Thought Content: Thought content is paranoid.    Review of  Systems  Constitutional: Negative.   HENT: Negative.   Eyes: Negative.   Respiratory: Negative.   Cardiovascular: Negative.   Gastrointestinal: Negative.   Genitourinary: Negative.   Musculoskeletal: Negative.   Skin: Negative.    Neurological: Negative.   Endo/Heme/Allergies: Negative.   Psychiatric/Behavioral:       Obsessive, compulsive, and intrusive thoughts    Blood pressure 103/73, pulse 78, temperature 98.2 F (36.8 C), temperature source Tympanic, resp. rate 16, height 5\' 1"  (1.549 m), weight 122 lb (55.3 kg), SpO2 100 %. Body mass index is 23.05 kg/m.  Past Psychiatric History: None reported   Is the patient at risk to self? Yes  Has the patient been a risk to self in the past 6 months? No .    Has the patient been a risk to self within the distant past? No   Is the patient a risk to others? No   Has the patient been a risk to others in the past 6 months? No   Has the patient been a risk to others within the distant past? No   Past Medical History: No past medical history on file. No past surgical history on file.  Family History: No family history on file.  Social History:  Social History   Socioeconomic History  . Marital status: Single    Spouse name: Not on file  . Number of children: Not on file  . Years of education: Not on file  . Highest education level: Not on file  Occupational History  . Not on file  Tobacco Use  . Smoking status: Never Smoker  . Smokeless tobacco: Never Used  Substance and Sexual Activity  . Alcohol use: No  . Drug use: No  . Sexual activity: Not on file  Other Topics Concern  . Not on file  Social History Narrative  . Not on file   Social Determinants of Health   Financial Resource Strain:   . Difficulty of Paying Living Expenses:   Food Insecurity:   . Worried About in the Last Year:   . Programme researcher, broadcasting/film/video in the Last Year:   Transportation Needs:   . Barista (Medical):   Freight forwarder Lack of Transportation (Non-Medical):   Physical Activity:   . Days of Exercise per Week:   . Minutes of Exercise per Session:   Stress:   . Feeling of Stress :   Social Connections:   . Frequency of Communication with Friends and Family:    . Frequency of Social Gatherings with Friends and Family:   . Attends Religious Services:   . Active Member of Clubs or Organizations:   . Attends Marland Kitchen Meetings:   Banker Marital Status:   Intimate Partner Violence:   . Fear of Current or Ex-Partner:   . Emotionally Abused:   Marland Kitchen Physically Abused:   . Sexually Abused:     SDOH:  SDOH Screenings   Alcohol Screen:   . Last Alcohol Screening Score (AUDIT):   Depression (PHQ2-9):   . PHQ-2 Score:   Financial Resource Strain:   . Difficulty of Paying Living Expenses:   Food Insecurity:   . Worried About Marland Kitchen in the Last Year:   . Programme researcher, broadcasting/film/video of Food in the Last Year:   Housing:   . Last Housing Risk Score:   Physical Activity:   . Days of Exercise per Week:   . Minutes of Exercise per Session:  Social Connections:   . Frequency of Communication with Friends and Family:   . Frequency of Social Gatherings with Friends and Family:   . Attends Religious Services:   . Active Member of Clubs or Organizations:   . Attends Banker Meetings:   Marland Kitchen Marital Status:   Stress:   . Feeling of Stress :   Tobacco Use: Low Risk   . Smoking Tobacco Use: Never Smoker  . Smokeless Tobacco Use: Never Used  Transportation Needs:   . Freight forwarder (Medical):   Marland Kitchen Lack of Transportation (Non-Medical):     Last Labs:  Admission on 05/02/2020  Component Date Value Ref Range Status  . SARS Coronavirus 2 Ag 05/02/2020 NEGATIVE  NEGATIVE Final   Comment: (NOTE) SARS-CoV-2 antigen NOT DETECTED.   Negative results are presumptive.  Negative results do not preclude SARS-CoV-2 infection and should not be used as the sole basis for treatment or other patient management decisions, including infection  control decisions, particularly in the presence of clinical signs and  symptoms consistent with COVID-19, or in those who have been in contact with the virus.  Negative results must be combined  with clinical observations, patient history, and epidemiological information. The expected result is Negative.  Fact Sheet for Patients: https://sanders-williams.net/  Fact Sheet for Healthcare Providers: https://martinez.com/   This test is not yet approved or cleared by the [place] FDA and  has been authorized for detection and/or diagnosis of SARS-CoV-2 by FDA under an Emergency Use Authorization (EUA).  This EUA will remain in effect (meaning this test can be used) for the duration of  the C                          OVID-19 declaration under Section 564(b)(1) of the Act, 21 U.S.C. section 360bbb-3(b)(1), unless the authorization is terminated or revoked sooner.      Allergies: Patient has no known allergies.  PTA Medications: (Not in a hospital admission)   Medical Decision Making  Labs and covid test have been ordered Vital signs are within normal limits    Recommendations  Based on my evaluation the patient does not appear to have an emergency medical condition.  Maryfrances Bunnell, FNP 05/02/20  4:17 PM

## 2020-05-02 NOTE — ED Provider Notes (Signed)
Behavioral Health Medical Screening Exam  Eileen Rivas is a 19 y.o. female.Patient is a 19 year old female with no past psychiatric history.  Patient presents with severe anxiety and religious preoccupation with obsessive-compulsive thoughts.  Patient presented voluntarily to the [facility] and is voluntarily agreeing to be admitted for treatment.  Patient reports that she has never had anything like this happening until approximately 3 to 4 months ago.  She reports that she will have these intrusive thoughts of bad things that will happen to herself or to someone in her family and she will have to say a prayer to contradict that thoughts and so that does not happen to anyone else.  Patient reports that she has not been sleeping and that she has had a very poor appetite and has lost weight.  She also reports that she has not been bathing due to the intrusive thoughts preventing her from getting in the shower.  During the evaluation patient continually has constant thought of fear with relation to various problems.  Review TTS note for specific example that happened during their evaluation.  Patient is distressed and at times will become tearful.  She stated she was a fear of speaking to her mother about being admitted to the hospital because it would be too overwhelming to her.  Patient also spoke about a "blood covenant" when discussing getting lab work done and stating that she cannot look at the blood when it is drawn.  However patient did agree to have her blood work done and would look the other way so that she did not have to see the blood.  Total Time spent with patient: 30 minutes  Psychiatric Specialty Exam  Presentation  General Appearance:Casual  Eye Contact:Fair  Speech:Clear and Coherent  Speech Volume:Decreased  Handedness:Right   Mood and Affect  Mood:Anxious  Affect:Constricted   Thought Process  Thought Processes:Disorganized  Descriptions of  Associations:Circumstantial  Orientation:Full (Time, Place and Person)  Thought Content:Obsessions;Paranoid Ideation  Hallucinations:None  Ideas of Reference:Paranoia  Suicidal Thoughts:No  Homicidal Thoughts:No   Sensorium  Memory:Immediate Fair;Recent Fair;Remote Fair  Judgment:Impaired  Insight:Lacking   Executive Functions  Concentration:Fair  Attention Span:Fair  Recall:Fair  Fund of Knowledge:Fair  Language:Fair   Psychomotor Activity  Psychomotor Activity:Normal   Assets  Assets:Desire for Improvement;Financial Resources/Insurance;Housing;Social Support;Physical Health;Transportation   Sleep  Sleep:Poor  Number of hours: No data recorded  Physical Exam: Physical Exam Vitals and nursing note reviewed.  Constitutional:      Appearance: She is well-developed.  Cardiovascular:     Rate and Rhythm: Normal rate.  Pulmonary:     Effort: Pulmonary effort is normal.  Musculoskeletal:        General: Normal range of motion.  Skin:    General: Skin is warm.  Neurological:     Mental Status: She is alert and oriented to person, place, and time.    Review of Systems  Constitutional: Negative.   HENT: Negative.   Eyes: Negative.   Respiratory: Negative.   Cardiovascular: Negative.   Gastrointestinal: Negative.   Genitourinary: Negative.   Musculoskeletal: Negative.   Skin: Negative.   Neurological: Negative.   Endo/Heme/Allergies: Negative.   Psychiatric/Behavioral:       Intrusive thoughts, obsessive and compulsive thoughts   Blood pressure 103/73, pulse 78, temperature 98.2 F (36.8 C), temperature source Tympanic, resp. rate 16, height 5\' 1"  (1.549 m), weight 122 lb (55.3 kg), SpO2 100 %. Body mass index is 23.05 kg/m.  Musculoskeletal: Strength & Muscle Tone: within normal limits  Gait & Station: normal Patient leans: N/A   Recommendations:  Based on my evaluation the patient does not appear to have an emergency medical  condition.  Gerlene Burdock Hutch Rhett, FNP 05/02/2020, 4:40 PM

## 2020-05-02 NOTE — ED Notes (Signed)
Patient laying in bed chair in no acute distress or discomfort. Respiration even and unlabored.

## 2020-05-02 NOTE — ED Notes (Signed)
Patient resting in bed this evening.  Presents guarded with sullen affect, soft and logical speech, ambulatory with a steady gait. Denies SI, HI, AVH and pain at this time. But while talking to her looks over her shoulder and starts to talk to self. When asked was she hearing any voices states no I was just praying.   Denies any issues or concerns this evening. Pt remains on continuous observation  without self harm gestures. Writer encouraged pt to voice concerns. Pt remains safe on unit. covid PCR obtained. Awaiting results for transfer to [facility].

## 2020-05-02 NOTE — ED Notes (Addendum)
Received Eileen Rivas into [facility] after her covid test, bloodwork and obtaining urine. She remains anxious thoughout the above procedures. She denied feeling suicidal at the present time.
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
## 2020-05-03 ENCOUNTER — Inpatient Hospital Stay (HOSPITAL_COMMUNITY)
Admission: AD | Admit: 2020-05-03 | Discharge: 2020-05-05 | DRG: 882 | Disposition: A | Discharge status: Home / Self Care | Payer: Federal, State, Local not specified - Other | Source: Intra-hospital | Attending: Psychiatry | Admitting: Psychiatry

## 2020-05-03 ENCOUNTER — Encounter (HOSPITAL_COMMUNITY): Payer: Self-pay | Admitting: Psychiatry

## 2020-05-03 DIAGNOSIS — Z79899 Other long term (current) drug therapy: Secondary | ICD-10-CM

## 2020-05-03 DIAGNOSIS — R45851 Suicidal ideations: Secondary | ICD-10-CM | POA: Diagnosis present

## 2020-05-03 DIAGNOSIS — Z20822 Contact with and (suspected) exposure to covid-19: Secondary | ICD-10-CM | POA: Diagnosis present

## 2020-05-03 DIAGNOSIS — G4700 Insomnia, unspecified: Secondary | ICD-10-CM | POA: Diagnosis present

## 2020-05-03 DIAGNOSIS — F429 Obsessive-compulsive disorder, unspecified: Principal | ICD-10-CM | POA: Diagnosis present

## 2020-05-03 DIAGNOSIS — F329 Major depressive disorder, single episode, unspecified: Secondary | ICD-10-CM | POA: Diagnosis present

## 2020-05-03 LAB — POCT URINALYSIS DIP (DEVICE)
Glucose, UA: NEGATIVE mg/dL
Hgb urine dipstick: NEGATIVE
Leukocytes,Ua: NEGATIVE
Nitrite: NEGATIVE
Protein, ur: NEGATIVE mg/dL
Specific Gravity, Urine: >1.03 — ABNORMAL HIGH (ref 1.005–1.030)
Urobilinogen, UA: 1 mg/dL (ref 0.0–1.0)
pH: 8.5 — ABNORMAL HIGH (ref 5.0–8.0)

## 2020-05-03 LAB — T4, FREE: Free T4: 0.84 ng/dL (ref 0.61–1.12)

## 2020-05-03 MED ORDER — ALUM & MAG HYDROXIDE-SIMETH 200-200-20 MG/5ML PO SUSP: 30.0000 mL | ORAL | Status: DC | PRN

## 2020-05-03 MED ORDER — ACETAMINOPHEN 325 MG PO TABS: 650.0000 mg | ORAL_TABLET | Freq: Four times a day (QID) | ORAL | Status: DC | PRN

## 2020-05-03 MED ORDER — SERTRALINE HCL 25 MG PO TABS
25.0000 mg | ORAL_TABLET | Freq: Every day | ORAL | Status: DC
  Administered 2020-05-03 – 2020-05-04 (×2): 25 mg via ORAL
  Filled 2020-05-03 (×3): qty 1

## 2020-05-03 MED ORDER — OLANZAPINE 5 MG PO TABS
5.0000 mg | ORAL_TABLET | Freq: Every day | ORAL | Status: DC
  Administered 2020-05-04: 5 mg via ORAL
  Filled 2020-05-03 (×3): qty 1
  Filled 2020-05-03: qty 2

## 2020-05-03 MED ORDER — MAGNESIUM HYDROXIDE 400 MG/5ML PO SUSP: 30.0000 mL | Freq: Every day | ORAL | Status: DC | PRN

## 2020-05-03 MED ORDER — HYDROXYZINE HCL 25 MG PO TABS: 25.0000 mg | ORAL_TABLET | Freq: Three times a day (TID) | ORAL | Status: DC | PRN

## 2020-05-03 MED ORDER — TRAZODONE HCL 50 MG PO TABS: 50.0000 mg | ORAL_TABLET | Freq: Every evening | ORAL | Status: DC | PRN

## 2020-05-03 NOTE — Progress Notes (Signed)
Eileen Rivas is a 19 y.o. female Voluntary admitted for intrusive thoughts . Pt stated she has been praying a lot because she has been having evil thoughts that some thing bad is going to happen to her family and has " contradict " her thoughts. Pt has been observed mumbling like saying something, but when asked if she is hearing voices, pt denied. Pt also denied SI/HI and contracted. For safety. Pt has been calm and cooperative with admission process. Consents signed, skin/belongings search completed and pt oriented to unit. Pt stable at this time. Pt given the opportunity to express concerns and ask questions. Pt given toiletries. Will continue to monitor.

## 2020-05-03 NOTE — Progress Notes (Signed)
Pt has been isolative, stayed in her room the entire evening. Pt's minimizing, observed responding to internal stimuli but denies when asked. Pt denies physical pain, safety ensured with 15 minute and environmental checks. Pt currently denies SI/HI and A/V hallucinations. Pt verbally agrees to seek staff if SI/HI or A/VH occurs and to consult with staff before acting on these thoughts. Will continue POC.

## 2020-05-03 NOTE — Plan of Care (Signed)
Progress note  D: pt found in the dayroom interacting. Pt had no scheduled medications at time of assessment. Pt denies any physical complaints. Pt does seem anxious, depressed, and pensive on approach. Pt denies any physical complaints. Pt is flat in affect. Pt expresses wants for discharge. Pt denies si/hi/ah/vh and verbally agrees to approach staff if these become apparent or before harming themself/others while at [facility]. Pt does seem preoccupied. A: Pt provided support and encouragement. Pt given medication per protocol and standing orders. Q75m safety checks implemented and continued.  R: Pt safe on the unit. Will continue to monitor.  Pt progressing in the following metrics  Problem: Education: Goal: Knowledge of  General Education information/materials will improve Outcome: Progressing Goal: Emotional status will improve Outcome: Progressing Goal: Mental status will improve Outcome: Progressing Goal: Verbalization of understanding the information provided will improve Outcome: Progressing

## 2020-05-03 NOTE — H&P (Signed)
Psychiatric Admission Assessment Adult  Patient Identification: Eileen Rivas MRN:  [id number] Date of Evaluation:  05/03/2020 Chief Complaint:  OCD (obsessive compulsive disorder) [F42.9] " Intrusive Thoughts" Principal Diagnosis: <principal problem not specified> Diagnosis:  Active Problems:   OCD (obsessive compulsive disorder)  History of Present Illness: Patient is a 19 year old female with no past psychiatric history and recent onset of significant anxiety and religious preoccupation/obsessive compulsive thoughts. She presented to [facility] for assessment and treatment recommendations. Patient does not recognize any stressors/ precipiting event but states intrusive thoughts started 2-3 months ago. She states it began as more of a focus on prayer that has evolved to demonic influence and concern if she does not "contradict" the thoughts, something "bad will happen to myself and my family."She states that she can't share the contents of these thought because they are extremely bad. The time occupied by the obsessive thoughts is nearly constant all day along. They incapacitate her from doing her college work and causes substantial interference with her social life. The distress by thoughts is disabling, constant and she is having constant panic attacks. If there is interference with her compulsive behavior the anxiety from it is prominent and very disturbing.She has to put substantial effort to resist these thoughts all the time as she is not able to ignore them. She states this strong drive to perform behavior experienced as completely involuntary and over powering and rarely can delay it. She is tearful at points asking for help and is observed responding to the intrusive thoughts during assessment. She shares that anything related to electricity, cutting anything, sometimes colors association can trigger these thoughts to maximum intensity. She states she feels better when she is  around people and talking to them but she is not able to sleep at night because of her thoughts and usually go to sleep in the morning when people starts waking up. She is not able to eat anything because of same preoccupations. Patient agrees to suicidal ideation but no homicidal ideation or any suicide attempt in the past. No history of self harm.  She did state that she had seen an unspecified psychologist approximately a month prior to admission, and she believes they prescribed fluoxetine and some sleeping medicine.  She did not take the medicine for fear of something that the medications would do to her. She mentions about washing her hands constantly around a year ago but they went away and all these intrusive thoughts are new, never happened in the past and are increasing day by day. She is asked for her consent to talk to her mother about her. She starting saying some prayers, took some time then consented and shared her phone number but constantly saying prayers and mentioning that something bad might happen to her mother.  Collateral from mother: Talked to the mother for 40 minutes ( phone conversation): According to mother the patient is always a shy, introverted, "A" student. She was an 8 pound baby through C-section. During Elementary school she was bullied and called names like Ugly etc. At age 53-7 the patient was seeing some demons in her room and she was taken for counseling and she did good after those sessions. At age 63, she was anorexic, refuse to eat any food except lettuce and shared the thoughts of either being extremely skinny or dead. When forced fed by mother, would go to bathroom and throw up. Again taken for counseling and that helped her a lot. Mother denies any  other abuse then bullying like verbal/sexual.  When asked about the recent event about her daughter the mother explained- In October, 2020 the daughter came to home because of pandemic and was doing totally fine. But 3  months ago she started having these strong bad thoughts which prevented her daughter to do anything like bathing, eating food, doing her online classes. The mother noticed her daughter's behavior about repeating a thing like closing opening car's door multiple times, praying a lot, mumbling to herself. When confronted by the mother, the patients was tearful and asking for help.  The mothers talked about her boyfriend by online dating who broke with her 3 months ago. Also, they got together again but broke off 3 days ago.Mother asked to stop talking to this guy- the patients replies she does not have anybody else to talk to- no friends, no family members. Mother wanted to send her to [place] to her sister to spend some time away from here and with lots of family members as mother is divorced and hold 2 jobs. She mentions about similar symptoms in her female cousin but explains the cousin had verbal/mental/psyhical trauma history and she is not sure what is his diagnosis. Father of the patient lives in [place] and does not believe in psychiatry. He insists on taking daughter to the church as he thinks she is possessed with the demons. Mother cried multiple times during the phone conversation and is really concerned about her daughter's well being. She is a proud mother of her smart,good girl. Associated Signs/Symptoms: Depression Symptoms:  depressed mood, insomnia, feelings of worthlessness/guilt, difficulty concentrating, hopelessness, suicidal thoughts without plan, anxiety, panic attacks, loss of energy/fatigue, (Hypo) Manic Symptoms:  NA Anxiety Symptoms:  Excessive Worry, Panic Symptoms, Obsessive Compulsive Symptoms:   Checking, Counting, Handwashing, Praying, Psychotic Symptoms:  NA PTSD Symptoms: NA Total Time spent with patient: 30 minutes  Past Psychiatric History: She does not report any past psychiatric history except seeing a psychologist for the same reason. Is the patient at risk to  self? Yes.    Has the patient been a risk to self in the past 6 months? No.  Has the patient been a risk to self within the distant past? No.  Is the patient a risk to others? No.  Has the patient been a risk to others in the past 6 months? No.  Has the patient been a risk to others within the distant past? No.   Prior Inpatient Therapy:   Prior Outpatient Therapy:    Alcohol Screening: 1. How often do you have a drink containing alcohol?: Never 2. How many drinks containing alcohol do you have on a typical day when you are drinking?: 1 or 2 3. How often do you have six or more drinks on one occasion?: Never AUDIT-C Score: 0 4. How often during the last year have you found that you were not able to stop drinking once you had started?: Never 5. How often during the last year have you failed to do what was normally expected from you because of drinking?: Never 6. How often during the last year have you needed a first drink in the morning to get yourself going after a heavy drinking session?: Never 7. How often during the last year have you had a feeling of guilt of remorse after drinking?: Never 8. How often during the last year have you been unable to remember what happened the night before because you had been drinking?: Never 9. Have you  or someone else been injured as a result of your drinking?: No 10. Has a relative or friend or a doctor or another health worker been concerned about your drinking or suggested you cut down?: No Alcohol Use Disorder Identification Test Final Score (AUDIT): 0 Alcohol Brief Interventions/Follow-up: AUDIT Score <7 follow-up not indicated Substance Abuse History in the last 12 months:  No. Consequences of Substance Abuse: NA Previous Psychotropic Medications: No  Psychological Evaluations: Yes  Past Medical History:  Past Medical History:  Diagnosis Date   Anxiety    Depression    History reviewed. No pertinent surgical history. Family History:  History reviewed. No pertinent family history. Family Psychiatric  History: Unspecified- Mother's cousin -some similar symptoms Tobacco Screening: Have you used any form of tobacco in the last 30 days? (Cigarettes, Smokeless Tobacco, Cigars, and/or Pipes): No Social History:  Social History   Substance and Sexual Activity  Alcohol Use No     Social History   Substance and Sexual Activity  Drug Use No    Additional Social History:      Pain Medications: See MAR Prescriptions: See MAR Over the Counter: See MAR History of alcohol / drug use?: No history of alcohol / drug abuse                    Allergies:  No Known Allergies Lab Results:  Results for orders placed or performed during the hospital encounter of 05/02/20 (from the past 48 hour(s))  POC SARS Coronavirus 2 Ag     Status: None   Collection Time: 05/02/20  4:00 PM  Result Value Ref Range   SARS Coronavirus 2 Ag NEGATIVE NEGATIVE    Comment: (NOTE) SARS-CoV-2 antigen NOT DETECTED.   Negative results are presumptive.  Negative results do not preclude SARS-CoV-2 infection and should not be used as the sole basis for treatment or other patient management decisions, including infection  control decisions, particularly in the presence of clinical signs and  symptoms consistent with COVID-19, or in those who have been in contact with the virus.  Negative results must be combined with clinical observations, patient history, and epidemiological information. The expected result is Negative.  Fact Sheet for Patients: PodPark.tn  Fact Sheet for Healthcare Providers: GiftContent.is   This test is not yet approved or cleared by the [place] FDA and  has been authorized for detection and/or diagnosis of SARS-CoV-2 by FDA under an Emergency Use Authorization (EUA).  This EUA will remain in effect (meaning this test can be used) for the duration of  the  C OVID-19 declaration under Section 564(b)(1) of the Act, 21 U.S.C. section 360bbb-3(b)(1), unless the authorization is terminated or revoked sooner.    POC SARS Coronavirus 2 Ag-ED - Nasal Swab (BD Veritor Kit)     Status: None   Collection Time: 05/02/20  4:04 PM  Result Value Ref Range   SARS Coronavirus 2 Ag Negative Negative  POCT Urine Drug Screen - (ICup)     Status: Normal   Collection Time: 05/02/20  4:11 PM  Result Value Ref Range   POC Amphetamine UR None Detected None Detected   POC Secobarbital (BAR) None Detected None Detected   POC Buprenorphine (BUP) None Detected None Detected   POC Oxazepam (BZO) None Detected None Detected   POC Cocaine UR None Detected None Detected   POC Methamphetamine UR None Detected None Detected   POC Morphine None Detected None Detected   POC Oxycodone UR None Detected None  Detected   POC Methadone UR None Detected None Detected   POC Marijuana UR None Detected None Detected  POCT urinalysis dip (device)     Status: Abnormal   Collection Time: 05/02/20  4:17 PM  Result Value Ref Range   Glucose, UA NEGATIVE NEGATIVE mg/dL   Bilirubin Urine SMALL (A) NEGATIVE   Ketones, ur TRACE (A) NEGATIVE mg/dL   Specific Gravity, Urine >1.030 (H) 1.005 - 1.030   Hgb urine dipstick NEGATIVE NEGATIVE   pH 8.5 (H) 5.0 - 8.0   Protein, ur NEGATIVE NEGATIVE mg/dL   Urobilinogen, UA 1.0 0.0 - 1.0 mg/dL   Nitrite NEGATIVE NEGATIVE   Leukocytes,Ua NEGATIVE NEGATIVE    Comment: Biochemical Testing Only. Please order routine urinalysis from main lab if confirmatory testing is needed. Performed at [facility], [place] [address] [place], [place] [address]   Pregnancy, urine POC     Status: None   Collection Time: 05/02/20  4:30 PM  Result Value Ref Range   Preg Test, Ur NEGATIVE NEGATIVE    Comment:        THE SENSITIVITY OF THIS METHODOLOGY IS >24 mIU/mL   Ethanol     Status: None   Collection Time: 05/02/20  5:22 PM  Result Value Ref Range    Alcohol, Ethyl (B) <10 <10 mg/dL    Comment: (NOTE) Lowest detectable limit for serum alcohol is 10 mg/dL.  For medical purposes only. Performed at [facility], [place] [address], [place], [place] [address]   Salicylate level     Status: Abnormal   Collection Time: 05/02/20  5:22 PM  Result Value Ref Range   Salicylate Lvl <9.3 (L) 7.0 - 30.0 mg/dL    Comment: Performed at [facility] [address], [place], [place] [address]  Acetaminophen level     Status: Abnormal   Collection Time: 05/02/20  5:22 PM  Result Value Ref Range   Acetaminophen (Tylenol), Serum <10 (L) 10 - 30 ug/mL    Comment: (NOTE) Therapeutic concentrations vary significantly. A range of 10-30 ug/mL  may be an effective concentration for many patients. However, some  are best treated at concentrations outside of this range. Acetaminophen concentrations >150 ug/mL at 4 hours after ingestion  and >50 ug/mL at 12 hours after ingestion are often associated with  toxic reactions.  Performed at [facility], [place] [address], [place], [place] [address]   CBC with Differential     Status: Abnormal   Collection Time: 05/02/20  5:25 PM  Result Value Ref Range   WBC 7.5 4.0 - 10.5 K/uL   RBC 4.35 3.87 - 5.11 MIL/uL   Hemoglobin 11.9 (L) 12.0 - 15.0 g/dL   HCT 38.0 36 - 46 %   MCV 87.4 80.0 - 100.0 fL   MCH 27.4 26.0 - 34.0 pg   MCHC 31.3 30.0 - 36.0 g/dL   RDW 14.0 11.5 - 15.5 %   Platelets 278 150 - 400 K/uL   nRBC 0.0 0.0 - 0.2 %   Neutrophils Relative % 61 %   Neutro Abs 4.5 1.7 - 7.7 K/uL   Lymphocytes Relative 28 %   Lymphs Abs 2.1 0.7 - 4.0 K/uL   Monocytes Relative 10 %   Monocytes Absolute 0.7 0 - 1 K/uL   Eosinophils Relative 1 %   Eosinophils Absolute 0.1 0 - 0 K/uL   Basophils Relative 0 %   Basophils Absolute 0.0 0 - 0 K/uL  Immature Granulocytes 0 %   Abs Immature Granulocytes 0.02 0.00 - 0.07 K/uL    Comment: Performed at [facility], [place] [address]  [place], [place] [address]  Comprehensive metabolic panel     Status: Abnormal   Collection Time: 05/02/20  5:25 PM  Result Value Ref Range   Sodium 137 135 - 145 mmol/L   Potassium 3.6 3.5 - 5.1 mmol/L   Chloride 108 98 - 111 mmol/L   CO2 20 (L) 22 - 32 mmol/L   Glucose, Bld 94 70 - 99 mg/dL    Comment: Glucose reference range applies only to samples taken after fasting for at least 8 hours.   BUN 6 6 - 20 mg/dL   Creatinine, Ser 0.48 0.44 - 1.00 mg/dL   Calcium 8.9 8.9 - 10.3 mg/dL   Total Protein 7.2 6.5 - 8.1 g/dL   Albumin 3.9 3.5 - 5.0 g/dL   AST 21 15 - 41 U/L   ALT 10 0 - 44 U/L   Alkaline Phosphatase 74 38 - 126 U/L   Total Bilirubin 0.5 0.3 - 1.2 mg/dL   GFR calc non Af Amer >60 >60 mL/min   GFR calc Af Amer >60 >60 mL/min   Anion gap 9 5 - 15    Comment: Performed at [facility], [place] [address] [place], [place] [address]  TSH     Status: Abnormal   Collection Time: 05/02/20  5:25 PM  Result Value Ref Range   TSH 10.615 (H) 0.350 - 4.500 uIU/mL    Comment: Performed by a 3rd Generation assay with a functional sensitivity of <=0.01 uIU/mL. Performed at [facility], [place] [address] [place], [place] [address]   SARS Coronavirus 2 by RT PCR (hospital order, performed in [facility] hospital lab) Nasopharyngeal Nasopharyngeal Swab     Status: None   Collection Time: 05/02/20  7:50 PM   Specimen: Nasopharyngeal Swab  Result Value Ref Range   SARS Coronavirus 2 NEGATIVE NEGATIVE    Comment: (NOTE) SARS-CoV-2 target nucleic acids are NOT DETECTED.  The SARS-CoV-2 RNA is generally detectable in upper and lower respiratory specimens during the acute phase of infection. The lowest concentration of SARS-CoV-2 viral copies this assay can detect is 250 copies / mL. A negative result does not preclude SARS-CoV-2 infection and should not be used as the sole basis for treatment or other patient management decisions.  A negative result may occur with improper specimen  collection / handling, submission of specimen other than nasopharyngeal swab, presence of viral mutation(s) within the areas targeted by this assay, and inadequate number of viral copies (<250 copies / mL). A negative result must be combined with clinical observations, patient history, and epidemiological information.  Fact Sheet for Patients:   StrictlyIdeas.no  Fact Sheet for Healthcare Providers: BankingDealers.co.za  This test is not yet approved or  cleared by the [place] FDA and has been authorized for detection and/or diagnosis of SARS-CoV-2 by FDA under an Emergency Use Authorization (EUA).  This EUA will remain in effect (meaning this test can be used) for the duration of the COVID-19 declaration under Section 564(b)(1) of the Act, 21 U.S.C. section 360bbb-3(b)(1), unless the authorization is terminated or revoked sooner.  Performed at [facility], [place] [address] [place], [place] [address]     Blood Alcohol level:  Lab Results  Component Value Date   [facility] <10 75/07/2584    Metabolic Disorder Labs:  No results  found for: HGBA1C, MPG No results found for: PROLACTIN No results found for: CHOL, TRIG, HDL, CHOLHDL, VLDL, LDLCALC  Current Medications: Current Facility-Administered Medications  Medication Dose Route Frequency Provider Last Rate Last Admin   acetaminophen (TYLENOL) tablet 650 mg  650 mg Oral Q6H PRN Money, Lowry Ram, FNP       alum & mag hydroxide-simeth (MAALOX/MYLANTA) 200-200-20 MG/5ML suspension 30 mL  30 mL Oral Q4H PRN Money, Lowry Ram, FNP       hydrOXYzine (ATARAX/VISTARIL) tablet 25 mg  25 mg Oral TID PRN Money, Lowry Ram, FNP       magnesium hydroxide (MILK OF MAGNESIA) suspension 30 mL  30 mL Oral Daily PRN Money, Lowry Ram, FNP       OLANZapine (ZYPREXA) tablet 5 mg  5 mg Oral QHS Money, Darnelle Maffucci B, FNP       sertraline (ZOLOFT) tablet 25 mg  25 mg Oral Daily Sharma Covert, MD    25 mg at 05/03/20 1226   traZODone (DESYREL) tablet 50 mg  50 mg Oral QHS PRN Money, Lowry Ram, FNP       PTA Medications: Medications Prior to Admission  Medication Sig Dispense Refill Last Dose   ibuprofen (ADVIL) 400 MG tablet Take 1 tablet (400 mg total) by mouth every 6 (six) hours as needed. (Patient not taking: Reported on 05/03/2020) 20 tablet 0 Not Taking at Unknown time   predniSONE (DELTASONE) 20 MG tablet Take 1 tablet (20 mg total) by mouth daily with breakfast. (Patient not taking: Reported on 05/02/2020) 10 tablet 0 Not Taking at Unknown time    Musculoskeletal: Strength & Muscle Tone: within normal limits Gait & Station: normal Patient leans: N/A  Psychiatric Specialty Exam: Physical Exam Constitutional:      Appearance: Normal appearance. She is normal weight.  HENT:     Head: Normocephalic and atraumatic.  Musculoskeletal:        General: Normal range of motion.     Cervical back: Normal range of motion.  Neurological:     General: No focal deficit present.     Mental Status: She is alert and oriented to person, place, and time.     Review of Systems  Blood pressure 93/66, pulse 90, temperature 98.5 F (36.9 C), temperature source Oral, resp. rate 16, height _0  (1.549 m), weight 55.3 kg, last menstrual period 04/22/2020, SpO2 100 %.Body mass index is 23.05 kg/m.  General Appearance: Casual  Eye Contact:  Good  Speech:  Slow  Volume:  Decreased  Mood:  Anxious and Hopeless  Affect:  Depressed  Thought Process:  Goal Directed  Orientation:  Full (Time, Place, and Person)  Thought Content:  WDL  Suicidal Thoughts:  Yes.  without intent/plan  Homicidal Thoughts:  No  Memory:  Immediate;   Good Recent;   Good  Judgement:  Good  Insight:  Good  Psychomotor Activity:  Normal  Concentration:  Concentration: Good and Attention Span: Good  Recall:  Good  Fund of Knowledge:  Good  Language:  Good  Akathisia:  No  Handed:  Right  AIMS (if indicated):      Assets:  Communication Skills Desire for Improvement Physical Health Resilience Social Support Talents/Skills Vocational/Educational  ADL's:  Intact  Cognition:  WNL  Sleep:  Number of Hours: 3    Treatment Plan Summary: Daily contact with patient to assess and evaluate symptoms and progress in treatment  Observation Level/Precautions:  Continuous Observation  Laboratory:  Free T3, Free T4  Psychotherapy:    Medications:    Consultations:    Discharge Concerns:    Estimated LOS:  Other:     Physician Treatment Plan for Primary Diagnosis: <principal problem not specified> Long Term Goal(s): Improvement in symptoms so as ready for discharge  Short Term Goals: Ability to disclose and discuss suicidal ideas and Compliance with prescribed medications will improve  Physician Treatment Plan for Secondary Diagnosis: Active Problems:   OCD (obsessive compulsive disorder)  Long Term Goal(s): Improvement in symptoms so as ready for discharge  Short Term Goals: Ability to identify changes in lifestyle to reduce recurrence of condition will improve, Ability to verbalize feelings will improve, Ability to disclose and discuss suicidal ideas, Ability to identify and develop effective coping behaviors will improve, Compliance with prescribed medications will improve and Ability to identify triggers associated with substance abuse/mental health issues will improve  I certify that inpatient services furnished can reasonably be expected to improve the patient's condition.    Honor Junes, MD 7/13/20213:22 PM

## 2020-05-03 NOTE — [facility] Suicide Risk Assessment (Signed)
[facility] Admission Suicide Risk Assessment   Nursing information obtained from:  Patient Demographic factors:  Unemployed, Adolescent or young adult Current Mental Status:  NA Loss Factors:  NA Historical Factors:  NA Risk Reduction Factors:  Living with another person, especially a relative, Religious beliefs about death, Positive social support  Total Time spent with patient: 30 minutes Principal Problem: <principal problem not specified> Diagnosis:  Active Problems:   OCD (obsessive compulsive disorder)  Subjective Data: Patient is seen and examined.  Patient is a 19 year old female with reportedly negative past psychiatric history who presented to the [facility] on 05/02/2020 with severe anxiety, religious preoccupation and intrusive thoughts.  The patient stated that she had been having these intrusive thoughts for approximately the last 2 months.  She stated that prior to that she had had an occasional thought, but things have gotten worse over the last 2 months.  She stated that one of the major stressors that led to this was she saw some unspecified dead person on social media.  She stated that that had affected her a great deal.  She began to have intrusive thoughts about bad things happening to her.  She was unable to control these thoughts.  She stated that she would have repetitive behaviors including priors to try and contradict the thoughts.  She denied any family history of any psychiatric illness.  She did state that she had seen an unspecified psychiatrist approximately a month prior to admission, and she believes they prescribed fluoxetine and some sleeping medicine.  She did not take the medicine for fear of something that the medications would do to her.  She admitted to weight loss, poor sleep, anxiety and suicidal thoughts.  Review of her admission laboratories also revealed hypothyroidism with a TSH of approximately 10.  She was unaware of any problems  with thyroid in the past.  She was admitted to the hospital for evaluation and stabilization.  Continued Clinical Symptoms:  Alcohol Use Disorder Identification Test Final Score (AUDIT): 0 The "Alcohol Use Disorders Identification Test", Guidelines for Use in Primary Care, Second Edition.  World Science writer [facility]). Score between 0-7:  no or low risk or alcohol related problems. Score between 8-15:  moderate risk of alcohol related problems. Score between 16-19:  high risk of alcohol related problems. Score 20 or above:  warrants further diagnostic evaluation for alcohol dependence and treatment.   CLINICAL FACTORS:   Obsessive-Compulsive Disorder   Musculoskeletal: Strength & Muscle Tone: within normal limits Gait & Station: normal Patient leans: N/A  Psychiatric Specialty Exam: Physical Exam Vitals and nursing note reviewed.  Constitutional:      Appearance: Normal appearance.  HENT:     Head: Normocephalic and atraumatic.  Pulmonary:     Effort: Pulmonary effort is normal.  Neurological:     General: No focal deficit present.     Mental Status: She is alert and oriented to person, place, and time.     Review of Systems  Blood pressure 93/66, pulse 90, temperature 98.5 F (36.9 C), temperature source Oral, resp. rate 16, height 5\' 1"  (1.549 m), weight 55.3 kg, last menstrual period 04/22/2020, SpO2 100 %.Body mass index is 23.05 kg/m.  General Appearance: Casual  Eye Contact:  Minimal  Speech:  Normal Rate  Volume:  Decreased  Mood:  Anxious and Depressed  Affect:  Congruent  Thought Process:  Coherent and Descriptions of Associations: Intact  Orientation:  Full (Time, Place, and Person)  Thought Content:  Obsessions and Rumination  Suicidal Thoughts:  Yes.  without intent/plan  Homicidal Thoughts:  No  Memory:  Immediate;   Fair Recent;   Fair Remote;   Fair  Judgement:  Intact  Insight:  Fair  Psychomotor Activity:  Decreased  Concentration:   Concentration: Fair and Attention Span: Fair  Recall:  [employer] of Knowledge:  Fair  Language:  Fair  Akathisia:  Negative  Handed:  Right  AIMS (if indicated):     Assets:  Desire for Improvement Resilience Social Support  ADL's:  Intact  Cognition:  WNL  Sleep:  Number of Hours: 3      COGNITIVE FEATURES THAT CONTRIBUTE TO RISK:  None    SUICIDE RISK:   Moderate:  Frequent suicidal ideation with limited intensity, and duration, some specificity in terms of plans, no associated intent, good self-control, limited dysphoria/symptomatology, some risk factors present, and identifiable protective factors, including available and accessible social support.  PLAN OF CARE: Patient is seen and examined.  Patient is a 19 year old female with the above-stated past psychiatric history who is admitted secondary to intrusive thoughts, suicidal ideation, anxiety and severe depression.  She will be admitted to the hospital.  She will be integrated in the milieu.  She will be encouraged to attend groups.  She was started already on Zyprexa 5 mg p.o. nightly.  I am also going to start sertraline 25 mg p.o. daily.  This to be titrated during the course of hospitalization.  I will also write for lorazepam 0.5 mg p.o. 3 times daily as needed severe anxiety.  She already has in place trazodone as well as the hydroxyzine for anxiety.  Review of her admission laboratories revealed essentially normal electrolytes, normal CBC, normal differential.  Pregnancy test was negative.  Her TSH was 10.615.  We have no previous test for that.  Blood alcohol was less than 10, salicylate was less than 7, acetaminophen was less than 10.  Drug screen was completely negative.  We will order a T3 and T4 to confirm her diagnosis, and this may be related to her current illness situation.  I certify that inpatient services furnished can reasonably be expected to improve the patient's condition.   Antonieta Pert, MD 05/03/2020,  10:31 AM

## 2020-05-03 NOTE — Tx Team (Signed)
Initial Treatment Plan 05/03/2020 3:22 AM [name] [id number]    PATIENT STRESSORS: Financial difficulties Marital or family conflict Medication change or noncompliance   PATIENT STRENGTHS: Communication skills Religious Affiliation Supportive family/friends   PATIENT IDENTIFIED PROBLEMS: Anxiety  Depression  "learn coping skills"  "Not to be easily stressed"               DISCHARGE CRITERIA:  Adequate post-discharge living arrangements Improved stabilization in mood, thinking, and/or behavior Motivation to continue treatment in a less acute level of care  PRELIMINARY DISCHARGE PLAN: Attend aftercare/continuing care group Attend PHP/IOP Outpatient therapy Return to previous living arrangement  PATIENT/FAMILY INVOLVEMENT: This treatment plan has been presented to and reviewed with the patient, Reba Hulett, and/or family member Anxiety .  The patient and family have been given the opportunity to ask questions and make suggestions.  Bethann Punches, RN 05/03/2020, 3:22 AM

## 2020-05-03 NOTE — Progress Notes (Signed)
Psychoeducational Group Note  Date:  05/03/2020 Time:  2051  Group Topic/Focus:  Wrap-Up Group:   The focus of this group is to help patients review their daily goal of treatment and discuss progress on daily workbooks.  Participation Level: Did Not Attend  Participation Quality:  Not Applicable  Affect:  Not Applicable  Cognitive:  Not Applicable  Insight:  Not Applicable  Engagement in Group: Not Applicable  Additional Comments:  The patient did not attend group this evening.   Hazle Coca S 05/03/2020, 8:51 PM
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
## 2020-05-04 ENCOUNTER — Other Ambulatory Visit: Payer: Self-pay

## 2020-05-04 LAB — SARS CORONAVIRUS 2 BY RT PCR (HOSPITAL ORDER, PERFORMED IN ~~LOC~~ HOSPITAL LAB): SARS Coronavirus 2: NEGATIVE

## 2020-05-04 LAB — T3 UPTAKE: T3 Uptake Ratio: 25 % (ref 24–39)

## 2020-05-04 MED ORDER — SERTRALINE HCL 50 MG PO TABS
50.0000 mg | ORAL_TABLET | Freq: Every day | ORAL | Status: DC
  Administered 2020-05-05: 50 mg via ORAL
  Filled 2020-05-04 (×2): qty 1

## 2020-05-04 MED ORDER — SERTRALINE HCL 25 MG PO TABS
25.0000 mg | ORAL_TABLET | Freq: Once | ORAL | Status: AC
  Administered 2020-05-04: 25 mg via ORAL
  Filled 2020-05-04: qty 1

## 2020-05-04 MED ORDER — BACITRACIN-NEOMYCIN-POLYMYXIN 400-5-5000 EX OINT
TOPICAL_OINTMENT | Freq: Three times a day (TID) | CUTANEOUS | Status: DC
  Administered 2020-05-04: 1 via TOPICAL

## 2020-05-04 NOTE — B[facility] Counselor (Signed)
CSW met with pt mom in lobby as requested by mother. Mother requested that pt be discharged before 7/16 as she has a trip to [place]; flying out of [place] at [employer] on 7/16. Mother says that [place] has easy access to mental health services and that pt's aunt who lives in [place] who is connected with an outpatient agency there. Mother is also requesting a covid test for the flight. CSW will pass information forward to MD.

## 2020-05-04 NOTE — Plan of Care (Signed)
Progress note  D: pt found in bed; compliant with medication administration. Pt denies any physical complaints or pain. Pt is still focused on discharge. Pt states they did not take their night time medications. Pt endorses sleeping for a "long time" but doesn't seem as though it was quality sleep. Pt has been seen interacting with others in the hallway. Pt continues to be minimal in assessment but is pleasant. Pt's affect continues to be flat/blank but pensive. Pt denies si/hi/ah/vh and verbally agrees to approach staff if these become apparent or before harming themself/others while at bhh.  A: Pt provided support and encouragement. Pt given medication per protocol and standing orders. Q21m safety checks implemented and continued.  R: Pt safe on the unit. Will continue to monitor.  Pt progressing in the following metrics  Problem: Coping: Goal: Ability to identify and develop effective coping behavior will improve Outcome: Progressing   Problem: Activity: Goal: Will verbalize the importance of balancing activity with adequate rest periods Outcome: Progressing   Problem: Education: Goal: Knowledge of the prescribed therapeutic regimen will improve Outcome: Progressing

## 2020-05-04 NOTE — Tx Team (Signed)
Interdisciplinary Treatment and Diagnostic Plan Update  05/04/2020 Time of Session: 9:15am [place] Warriner MRN: [id number]  Principal Diagnosis: <principal problem not specified>  Secondary Diagnoses: Active Problems:   OCD (obsessive compulsive disorder)   Current Medications:  Current Facility-Administered Medications  Medication Dose Route Frequency Provider Last Rate Last Admin  . acetaminophen (TYLENOL) tablet 650 mg  650 mg Oral Q6H PRN Money, Lowry Ram, FNP      . alum & mag hydroxide-simeth (MAALOX/MYLANTA) 200-200-20 MG/5ML suspension 30 mL  30 mL Oral Q4H PRN Money, Lowry Ram, FNP      . hydrOXYzine (ATARAX/VISTARIL) tablet 25 mg  25 mg Oral TID PRN Money, Lowry Ram, FNP      . magnesium hydroxide (MILK OF MAGNESIA) suspension 30 mL  30 mL Oral Daily PRN Money, Lowry Ram, FNP      . OLANZapine (ZYPREXA) tablet 5 mg  5 mg Oral QHS Money, Lowry Ram, FNP      . [START ON 05/05/2020] sertraline (ZOLOFT) tablet 50 mg  50 mg Oral Daily Dagar, Anjali, MD      . traZODone (DESYREL) tablet 50 mg  50 mg Oral QHS PRN Money, Lowry Ram, FNP       PTA Medications: Medications Prior to Admission  Medication Sig Dispense Refill Last Dose  . ibuprofen (ADVIL) 400 MG tablet Take 1 tablet (400 mg total) by mouth every 6 (six) hours as needed. (Patient not taking: Reported on 05/03/2020) 20 tablet 0 Not Taking at Unknown time  . predniSONE (DELTASONE) 20 MG tablet Take 1 tablet (20 mg total) by mouth daily with breakfast. (Patient not taking: Reported on 05/02/2020) 10 tablet 0 Not Taking at Unknown time    Patient Stressors: Financial difficulties Marital or family conflict Medication change or noncompliance  Patient Strengths: Armed forces logistics/support/administrative officer Religious Affiliation Supportive family/friends  Treatment Modalities: Medication Management, Group therapy, Case management,  1 to 1 session with clinician, Psychoeducation, Recreational therapy.   Physician Treatment Plan for Primary Diagnosis:  <principal problem not specified> Long Term Goal(s): Improvement in symptoms so as ready for discharge Improvement in symptoms so as ready for discharge   Short Term Goals: Ability to disclose and discuss suicidal ideas Compliance with prescribed medications will improve Ability to identify changes in lifestyle to reduce recurrence of condition will improve Ability to verbalize feelings will improve Ability to disclose and discuss suicidal ideas Ability to identify and develop effective coping behaviors will improve Compliance with prescribed medications will improve Ability to identify triggers associated with substance abuse/mental health issues will improve  Medication Management: Evaluate patient's response, side effects, and tolerance of medication regimen.  Therapeutic Interventions: 1 to 1 sessions, Unit Group sessions and Medication administration.  Evaluation of Outcomes: Not Met  Physician Treatment Plan for Secondary Diagnosis: Active Problems:   OCD (obsessive compulsive disorder)  Long Term Goal(s): Improvement in symptoms so as ready for discharge Improvement in symptoms so as ready for discharge   Short Term Goals: Ability to disclose and discuss suicidal ideas Compliance with prescribed medications will improve Ability to identify changes in lifestyle to reduce recurrence of condition will improve Ability to verbalize feelings will improve Ability to disclose and discuss suicidal ideas Ability to identify and develop effective coping behaviors will improve Compliance with prescribed medications will improve Ability to identify triggers associated with substance abuse/mental health issues will improve     Medication Management: Evaluate patient's response, side effects, and tolerance of medication regimen.  Therapeutic Interventions: 1 to 1 sessions, Unit Group  sessions and Medication administration.  Evaluation of Outcomes: Not Met   RN Treatment Plan for  Primary Diagnosis: <principal problem not specified> Long Term Goal(s): Knowledge of disease and therapeutic regimen to maintain health will improve  Short Term Goals: Ability to participate in decision making will improve, Ability to verbalize feelings will improve, Ability to identify and develop effective coping behaviors will improve and Compliance with prescribed medications will improve  Medication Management: RN will administer medications as ordered by provider, will assess and evaluate patient's response and provide education to patient for prescribed medication. RN will report any adverse and/or side effects to prescribing provider.  Therapeutic Interventions: 1 on 1 counseling sessions, Psychoeducation, Medication administration, Evaluate responses to treatment, Monitor vital signs and CBGs as ordered, Perform/monitor CIWA, COWS, AIMS and Fall Risk screenings as ordered, Perform wound care treatments as ordered.  Evaluation of Outcomes: Not Met   LCSW Treatment Plan for Primary Diagnosis: <principal problem not specified> Long Term Goal(s): Safe transition to appropriate next level of care at discharge, Engage patient in therapeutic group addressing interpersonal concerns.  Short Term Goals: Engage patient in aftercare planning with referrals and resources, Increase ability to appropriately verbalize feelings, Increase emotional regulation, Identify triggers associated with mental health/substance abuse issues and Increase skills for wellness and recovery  Therapeutic Interventions: Assess for all discharge needs, 1 to 1 time with Social worker, Explore available resources and support systems, Assess for adequacy in community support network, Educate family and significant other(s) on suicide prevention, Complete Psychosocial Assessment, Interpersonal group therapy.  Evaluation of Outcomes: Not Met   Progress in Treatment: Attending groups: No. Participating in groups: No. Taking  medication as prescribed: Yes. Toleration medication: Yes. Family/Significant other contact made: No, will contact:  CSW will contact mother. Patient understands diagnosis: Yes. Discussing patient identified problems/goals with staff: Yes. Medical problems stabilized or resolved: Yes. Denies suicidal/homicidal ideation: Yes. Issues/concerns per patient self-inventory: No.   New problem(s) identified: No, Describe:  none.  New Short Term/Long Term Goal(s): medication stabilization, elimination of SI thoughts, development of comprehensive mental wellness plan.   Patient Goals:  "To be able ignore catastrophic thoughts"  Discharge Plan or Barriers: Patient recently admitted. CSW will continue to follow and assess for appropriate referrals and possible discharge planning.   Reason for Continuation of Hospitalization: Anxiety Medication stabilization Other; describe intrusive thoughts  Estimated Length of Stay: 3-5 days   Attendees: Patient: Eileen Rivas 05/04/2020   Physician: Myles Lipps, MD 05/04/2020   Nursing:  05/04/2020   RN Care Manager: 05/04/2020   Social Worker: Darletta Moll, [place] 05/04/2020   Recreational Therapist:  05/04/2020   Other:  05/04/2020   Other:  05/04/2020   Other: 05/04/2020      Scribe for Treatment Team: Vassie Moselle, [place] 05/04/2020 2:59 PM

## 2020-05-04 NOTE — Progress Notes (Signed)
[facility] MD Progress Note  05/04/2020 7:38 AM Eileen Rivas  MRN:  [id number] Subjective:  Patient states her mood is better because of change of environment and lots of people to talk to about her situation. She adds she felt better yesterday when she came to the hospital as she got new room but today she is running out of new spaces where she can pray while having intrusive thoughts. She states she slept better last night and in-fact feels like she slept for 11 hours. She had appetite today and ate her breakfast. She states she is still having extreme anxiety  While trying to go for shower. She explains about her intrusive thoughts and they are still same, only that change of environment and people around is helping a little bit. She talked about her boyfriend from online dating site from another country. She considers him as one of the stressors and added 2 months ago she had to do psychotherapy for her obsessions towards her pimples on face and was very fixated about contents of the ointments. She denies any suicidal ideation, homicidal ideation or any AVH. Objective:  Patient is seen and examined. She is a 19 year old female with no past psychiatric history and recent onset of significant anxiety and religious preoccupation/obsessive compulsive thoughts She looks anxious, restricted but cooperative and alert. She was noticed praying and repeating multiple times. She sounds optimistic but was needing constant reassurance. She wants to go to [place] on 05/06/20 with her aunt and get treatment there but she is still waiting on her passport. No self injurious behavior noticed on the unit. Vials are remarkably stable. Her TSH was high (10.615) but her free T4 came normal (0.84) today, still waiting on T3.  Principal Problem: <principal problem not specified> Diagnosis: Active Problems:   OCD (obsessive compulsive disorder)  Total Time spent with patient: 30 minutes  Past Psychiatric History: See H & P  Past  Medical History:  Past Medical History:  Diagnosis Date  . Anxiety   . Depression    History reviewed. No pertinent surgical history. Family History: History reviewed. No pertinent family history. Family Psychiatric  History: See H & P Social History:  Social History   Substance and Sexual Activity  Alcohol Use No     Social History   Substance and Sexual Activity  Drug Use No    Social History   Socioeconomic History  . Marital status: Single    Spouse name: Not on file  . Number of children: Not on file  . Years of education: Not on file  . Highest education level: Not on file  Occupational History  . Not on file  Tobacco Use  . Smoking status: Never Smoker  . Smokeless tobacco: Never Used  Substance and Sexual Activity  . Alcohol use: No  . Drug use: No  . Sexual activity: Not on file  Other Topics Concern  . Not on file  Social History Narrative  . Not on file   Social Determinants of Health   Financial Resource Strain:   . Difficulty of Paying Living Expenses:   Food Insecurity:   . Worried About Programme researcher, broadcasting/film/video in the Last Year:   . Barista in the Last Year:   Transportation Needs:   . Freight forwarder (Medical):   Marland Kitchen Lack of Transportation (Non-Medical):   Physical Activity:   . Days of Exercise per Week:   . Minutes of Exercise per Session:  Stress:   . Feeling of Stress :   Social Connections:   . Frequency of Communication with Friends and Family:   . Frequency of Social Gatherings with Friends and Family:   . Attends Religious Services:   . Active Member of Clubs or Organizations:   . Attends Banker Meetings:   Marland Kitchen Marital Status:    Additional Social History:    Pain Medications: See MAR Prescriptions: See MAR Over the Counter: See MAR History of alcohol / drug use?: No history of alcohol / drug abuse                    Sleep: Good  Appetite:  Good  Current Medications: Current  Facility-Administered Medications  Medication Dose Route Frequency Provider Last Rate Last Admin  . acetaminophen (TYLENOL) tablet 650 mg  650 mg Oral Q6H PRN Money, Gerlene Burdock, FNP      . alum & mag hydroxide-simeth (MAALOX/MYLANTA) 200-200-20 MG/5ML suspension 30 mL  30 mL Oral Q4H PRN Money, Gerlene Burdock, FNP      . hydrOXYzine (ATARAX/VISTARIL) tablet 25 mg  25 mg Oral TID PRN Money, Gerlene Burdock, FNP      . magnesium hydroxide (MILK OF MAGNESIA) suspension 30 mL  30 mL Oral Daily PRN Money, Gerlene Burdock, FNP      . OLANZapine (ZYPREXA) tablet 5 mg  5 mg Oral QHS Money, Gerlene Burdock, FNP      . sertraline (ZOLOFT) tablet 25 mg  25 mg Oral Daily Antonieta Pert, MD   25 mg at 05/04/20 0727  . traZODone (DESYREL) tablet 50 mg  50 mg Oral QHS PRN Money, Gerlene Burdock, FNP        Lab Results:  Results for orders placed or performed during the hospital encounter of 05/03/20 (from the past 48 hour(s))  T4, free     Status: None   Collection Time: 05/03/20  6:06 PM  Result Value Ref Range   Free T4 0.84 0.61 - 1.12 ng/dL    Comment: (NOTE) Biotin ingestion may interfere with free T4 tests. If the results are inconsistent with the TSH level, previous test results, or the clinical presentation, then consider biotin interference. If needed, order repeat testing after stopping biotin. Performed at [facility], [address], [place], [place] [address]     Blood Alcohol level:  Lab Results  Component Value Date   ETH <10 05/02/2020    Metabolic Disorder Labs: No results found for: HGBA1C, MPG No results found for: PROLACTIN No results found for: CHOL, TRIG, HDL, CHOLHDL, VLDL, LDLCALC  Physical Findings: AIMS: Facial and Oral Movements Muscles of Facial Expression: None, normal Lips and Perioral Area: None, normal Jaw: None, normal Tongue: None, normal,Extremity Movements Upper (arms, wrists, hands, fingers): None, normal Lower (legs, knees, ankles, toes): None, normal, Trunk  Movements Neck, shoulders, hips: None, normal, Overall Severity Severity of abnormal movements (highest score from questions above): None, normal Incapacitation due to abnormal movements: None, normal Patient's awareness of abnormal movements (rate only patient's report): No Awareness, Dental Status Current problems with teeth and/or dentures?: No Does patient usually wear dentures?: No  CIWA:  CIWA-Ar Total: 4 COWS:  COWS Total Score: 1  Musculoskeletal: Strength & Muscle Tone: within normal limits Gait & Station: normal Patient leans: N/A  Psychiatric Specialty Exam: Physical Exam HENT:     Head: Normocephalic and atraumatic.  Musculoskeletal:     Cervical back: Normal range of motion.  Neurological:  General: No focal deficit present.     Mental Status: She is alert and oriented to person, place, and time.     Review of Systems  Blood pressure 92/68, pulse (!) 108, temperature 98.3 F (36.8 C), temperature source Oral, resp. rate 16, height 5\' 1"  (1.549 m), weight 55.3 kg, last menstrual period 04/22/2020, SpO2 100 %.Body mass index is 23.05 kg/m.  General Appearance: Casual  Eye Contact:  Good  Speech:  Pressured  Volume:  Normal  Mood:  Anxious  Affect:  Restricted  Thought Process:  Goal Directed and Descriptions of Associations: Circumstantial  Orientation:  Full (Time, Place, and Person)  Thought Content:  WDL  Suicidal Thoughts:  No  Homicidal Thoughts:  No  Memory:  Immediate;   Good Recent;   Good  Judgement:  Intact  Insight:  Present  Psychomotor Activity:  Normal  Concentration:  Concentration: Good and Attention Span: Good  Recall:  Good  Fund of Knowledge:  Good  Language:  Good  Akathisia:  No  Handed:  Right  AIMS (if indicated):     Assets:  Communication Skills Desire for Improvement Resilience Social Support Vocational/Educational  ADL's:  Intact  Cognition:  WNL  Sleep:  Number of Hours: 6.5   Assessement:  Patient is a  19 year old female with the above-stated past psychiatric history who is admitted secondary to intrusive thoughts, suicidal ideation, anxiety and severe depression. She might have Obsessive compulsive disorder because she has these typical compulsive rituals, fear of harming herself and others if loose control on intrusive thoughts, excessive religious concerns- repetitive praying. Reported history also suggests Generalized anxiety disorder because of intense worries, repeatedly expressing them but we would not see intrusive thoughts in GAD. In tics disorders we might see repetitive , compulsive behavior and similar to compulsions in OCD but it is highly unlikely in this patient.    Treatment Plan Summary: Daily contact with patient to assess and evaluate symptoms and progress in treatment.  Plan:  1. Increase Sertraline to 50 mg for controlling anxiety and helping with intrusive thoughts. 2. Continue Olanzapine ( Zyprexa) 5 mg PO QHS for augemetation of SSRI. 3. Continue Hydroxyzine 25 mg PRN TID for anxiety. 4. Continue Trazodone 50 mg PO QHS PRN daily . 5. Encouragement to attend group therapy.  6. Disposition in progress. 36, MD 05/04/2020, 7:38 AM

## 2020-05-04 NOTE — Progress Notes (Signed)
   05/04/20 2040  Psych Admission Type (Psych Patients Only)  Admission Status Voluntary  Psychosocial Assessment  Patient Complaints None  Eye Contact Fair  Facial Expression Blank;Flat;Pensive  Affect Anxious  Speech Logical/coherent;Soft  Interaction Cautious;Forwards little;Guarded;Minimal  Motor Activity Slow  Appearance/Hygiene Unremarkable  Behavior Characteristics Cooperative;Anxious  Mood Pleasant;Sad  Thought Process  Coherency WDL  Content WDL  Delusions None reported or observed  Perception WDL  Hallucination None reported or observed  Judgment Poor  Confusion None  Danger to Self  Current suicidal ideation? Denies  Danger to Others  Danger to Others None reported or observed   Pt rates anxiety 4/10. Pt soft spoken and pleasant. No complaints.

## 2020-05-05 MED ORDER — HYDROXYZINE HCL 25 MG PO TABS: 25.0000 mg | ORAL_TABLET | Freq: Three times a day (TID) | ORAL | 0 refills | Status: DC | PRN

## 2020-05-05 MED ORDER — TRAZODONE HCL 50 MG PO TABS: 50.0000 mg | ORAL_TABLET | Freq: Every evening | ORAL | 0 refills | Status: DC | PRN

## 2020-05-05 MED ORDER — OLANZAPINE 5 MG PO TABS: 5.0000 mg | ORAL_TABLET | Freq: Every day | ORAL | 0 refills | Status: DC

## 2020-05-05 MED ORDER — SERTRALINE HCL 50 MG PO TABS: 50.0000 mg | ORAL_TABLET | Freq: Every day | ORAL | 0 refills | Status: DC

## 2020-05-05 NOTE — Progress Notes (Signed)
Psychoeducational Group Note  Date:  05/04/2020 Time: 2100  Group Topic/Focus:  wrap up group  Participation Level: Did Not Attend  Participation Quality:  Not Applicable  Affect:  Not Applicable  Cognitive:  Not Applicable  Insight:  Not Applicable  Engagement in Group: Not Applicable  Additional Comments:  Pt was notified that group was beginning but fell back to sleep.   Marcille Buffy 05/05/2020, 4:44 AM

## 2020-05-05 NOTE — Discharge Summary (Signed)
Physician Discharge Summary Note  Patient:  Eileen Rivas is an 19 y.o., female MRN:  [id number] DOB:  [birth year] Patient phone:  [phone] (home)  Patient address:   [address] [place] [address],   Total Time spent with patient: Greater than 30 minutes  Date of Admission:  05/03/2020  Date of Discharge: 05-05-20  Reason for Admission:  Patient is a 19 year old female with no past psychiatric history and recent onset of significant anxiety and religious preoccupation/obsessive compulsive thoughts-" Intrusive thoughts".  Principal Problem: OCD (obsessive compulsive disorder)  Discharge Diagnoses: Principal Problem:   OCD (obsessive compulsive disorder)  Past Psychiatric History: OCD  Past Medical History:  Past Medical History:  Diagnosis Date  . Anxiety   . Depression    History reviewed. No pertinent surgical history.  Family History: History reviewed. No pertinent family history.  Family Psychiatric  History: See H&P  Social History:  Social History   Substance and Sexual Activity  Alcohol Use No     Social History   Substance and Sexual Activity  Drug Use No    Social History   Socioeconomic History  . Marital status: Single    Spouse name: Not on file  . Number of children: Not on file  . Years of education: Not on file  . Highest education level: Not on file  Occupational History  . Not on file  Tobacco Use  . Smoking status: Never Smoker  . Smokeless tobacco: Never Used  Substance and Sexual Activity  . Alcohol use: No  . Drug use: No  . Sexual activity: Not on file  Other Topics Concern  . Not on file  Social History Narrative  . Not on file   Social Determinants of Health   Financial Resource Strain:   . Difficulty of Paying Living Expenses:   Food Insecurity:   . Worried About Programme researcher, broadcasting/film/video in the Last Year:   . Barista in the Last Year:   Transportation Needs:   . Freight forwarder (Medical):   Marland Kitchen  Lack of Transportation (Non-Medical):   Physical Activity:   . Days of Exercise per Week:   . Minutes of Exercise per Session:   Stress:   . Feeling of Stress :   Social Connections:   . Frequency of Communication with Friends and Family:   . Frequency of Social Gatherings with Friends and Family:   . Attends Religious Services:   . Active Member of Clubs or Organizations:   . Attends Banker Meetings:   Marland Kitchen Marital Status:    Hospital Course: Patient is seen and examined.  Patient is a 19 year old female with reportedly negative past psychiatric history who presented to the [facility] on 05/02/2020 with severe anxiety, religious preoccupation and intrusive thoughts.  The patient stated that she had been having these intrusive thoughts for approximately the last 2 months.  She stated that prior to that she had had an occasional thought, but things have gotten worse over the last 2 months.  She stated that one of the major stressors that led to this was she saw some unspecified dead person on social media.  She stated that that had affected her a great deal.  She began to have intrusive thoughts about bad things happening to her.  She was unable to control these thoughts.  She stated that she would have repetitive behaviors including priors to try and contradict the thoughts.  She denied any family history of any psychiatric illness.  She did state that she had seen an unspecified psychiatrist approximately a month prior to admission, and she believes they prescribed fluoxetine and some sleeping medicine.  She did not take the medicine for fear of something that the medications would do to her.  She admitted to weight loss, poor sleep, anxiety and suicidal thoughts.  Review of her admission laboratories also revealed hypothyroidism with a TSH of approximately 10.  She was unaware of any problems with thyroid in the past.  She was admitted to the hospital for  evaluation and stabilization. She was started on Sertraline 25 mg PO daily and Zyprexa 5 mg PO QHS. Next day she started running out of places to pray in her room so, her sertraline was increased to 50 mg PO daily.  On discharge day patient denies any suicidal/ homicidal ideation or any auditory/ visual hallucinations.   Physical Findings: AIMS: Facial and Oral Movements Muscles of Facial Expression: None, normal Lips and Perioral Area: None, normal Jaw: None, normal Tongue: None, normal,Extremity Movements Upper (arms, wrists, hands, fingers): None, normal Lower (legs, knees, ankles, toes): None, normal, Trunk Movements Neck, shoulders, hips: None, normal, Overall Severity Severity of abnormal movements (highest score from questions above): None, normal Incapacitation due to abnormal movements: None, normal Patient's awareness of abnormal movements (rate only patient's report): No Awareness, Dental Status Current problems with teeth and/or dentures?: No Does patient usually wear dentures?: No  CIWA:  CIWA-Ar Total: 4 COWS:  COWS Total Score: 1  Musculoskeletal: Strength & Muscle Tone: within normal limits Gait & Station: normal Patient leans: N/A  Psychiatric Specialty Exam: Physical Exam Vitals and nursing note reviewed.  HENT:     Head: Normocephalic.     Nose: Nose normal.     Mouth/Throat:     Pharynx: Oropharynx is clear.  Eyes:     Pupils: Pupils are equal, round, and reactive to light.  Cardiovascular:     Pulses: Normal pulses.     Comments: Elevated pulse rate: 124. Patient is in no apparent distress. Pulmonary:     Effort: Pulmonary effort is normal.  Genitourinary:    Comments: Deferred Musculoskeletal:        General: Normal range of motion.     Cervical back: Normal range of motion.  Skin:    General: Skin is warm and dry.  Neurological:     Mental Status: She is alert and oriented to person, place, and time.     Review of Systems  Constitutional:  Negative for chills, diaphoresis and fever.  HENT: Negative for congestion, rhinorrhea, sneezing and sore throat.   Eyes: Negative for discharge.  Respiratory: Negative for apnea, cough, shortness of breath and wheezing.   Cardiovascular: Negative for chest pain and palpitations.  Gastrointestinal: Negative for diarrhea, nausea and vomiting.  Endocrine: Negative for cold intolerance.  Genitourinary: Negative for difficulty urinating.  Musculoskeletal: Negative for arthralgias and myalgias.  Allergic/Immunologic:       Allergies: NKDA  Neurological: Negative for dizziness, tremors, seizures, syncope, numbness and headaches.  Psychiatric/Behavioral: Positive for dysphoric mood (Stabilized with medication prior to discharge) and sleep disturbance (Stabilized with medication prior to discharge). Negative for agitation, behavioral problems, confusion, decreased concentration, hallucinations, self-injury and suicidal ideas. The patient is not nervous/anxious (Stable upon discharge) and is not hyperactive.     Blood pressure (!) 80/58, pulse (!) 124, temperature 99.1 F (37.3 C), temperature source Oral, resp. rate 16, height 5\' 1"  (1.549  m), weight 55.3 kg, last menstrual period 04/22/2020, SpO2 (!) 82 %.Body mass index is 23.05 kg/m.  See Md's discharge SRA  Sleep:  Number of Hours: 6.5   Have you used any form of tobacco in the last 30 days? (Cigarettes, Smokeless Tobacco, Cigars, and/or Pipes): No  Has this patient used any form of tobacco in the last 30 days? (Cigarettes, Smokeless Tobacco, Cigars, and/or Pipes) N/A  Blood Alcohol level:  Lab Results  Component Value Date   ETH <10 05/02/2020   Metabolic Disorder Labs:  No results found for: HGBA1C, MPG No results found for: PROLACTIN No results found for: CHOL, TRIG, HDL, CHOLHDL, VLDL, LDLCALC  See Psychiatric Specialty Exam and Suicide Risk Assessment completed by Attending Physician prior to discharge.  Discharge destination:   Home  Is patient on multiple antipsychotic therapies at discharge:  No   Has Patient had three or more failed trials of antipsychotic monotherapy by history:  No  Recommended Plan for Multiple Antipsychotic Therapies: NA  Allergies as of 05/05/2020   No Known Allergies     Medication List    STOP taking these medications   ibuprofen 400 MG tablet Commonly known as: ADVIL   predniSONE 20 MG tablet Commonly known as: DELTASONE     TAKE these medications     Indication  hydrOXYzine 25 MG tablet Commonly known as: ATARAX/VISTARIL Take 1 tablet (25 mg total) by mouth 3 (three) times daily as needed for anxiety.  Indication: Feeling Anxious   OLANZapine 5 MG tablet Commonly known as: ZYPREXA Take 1 tablet (5 mg total) by mouth at bedtime. For mood control  Indication: Mood control   sertraline 50 MG tablet Commonly known as: ZOLOFT Take 1 tablet (50 mg total) by mouth daily. For depression Start taking on: May 06, 2020  Indication: Major Depressive Disorder   traZODone 50 MG tablet Commonly known as: DESYREL Take 1 tablet (50 mg total) by mouth at bedtime as needed for sleep.  Indication: Trouble Sleeping       Follow-up Information    [facility] Call on 05/23/2020.   Why: You have an appointment for therapy 05/23/20 at 11:00 am, and on 05/31/20 at 10:30 am for medication management.  These appointments will be held Virtually.  **(PLEASE CHECK WITH YOUR INSURANCE PROVIDER TO CONFIRM THAT THEY COVER VIRTUAL APPOINTMENTS)** Contact information: [address] [place] [address] [phone]              Follow-up recommendations: Activity:  As tolerated Diet: As recommended by your primary care doctor. Keep all scheduled follow-up appointments as recommended.   Comments: Prescriptions given at discharge.  Patient agreeable to plan.  Given opportunity to ask questions.  Appears to feel comfortable with discharge denies any current suicidal or  homicidal thought. Patient is also instructed prior to discharge to: Take all medications as prescribed by his/her mental healthcare provider. Report any adverse effects and or reactions from the medicines to his/her outpatient provider promptly. Patient has been instructed & cautioned: To not engage in alcohol and or illegal drug use while on prescription medicines. In the event of worsening symptoms, patient is instructed to call the crisis hotline, 911 and or go to the nearest ED for appropriate evaluation and treatment of symptoms. To follow-up with his/her primary care provider for your other medical issues, concerns and or health care needs.  Signed: Arnoldo Lenis, MD, PMHNP, FNP-BC 05/05/2020, 4:16 PM

## 2020-05-05 NOTE — Plan of Care (Signed)
Discharge note  Patient verbalizes readiness for discharge. Follow up plan explained, AVS, Transition record and SRA given. Prescriptions and teaching provided. Belongings returned and signed for. Suicide safety plan completed and signed. Patient verbalizes understanding. Patient denies SI/HI and assures this Clinical research associate they will seek assistance should that change. Patient discharged to lobby where mother was waiting.  Problem: Coping: Goal: Ability to identify and develop effective coping behavior will improve Outcome: Adequate for Discharge   Problem: Coping: Goal: Coping ability will improve Outcome: Adequate for Discharge   Problem: Education: Goal: Knowledge of [place] [facility] Education information/materials will improve Outcome: Adequate for Discharge Goal: Emotional status will improve Outcome: Adequate for Discharge Goal: Mental status will improve Outcome: Adequate for Discharge Goal: Verbalization of understanding the information provided will improve Outcome: Adequate for Discharge   Problem: Activity: Goal: Will verbalize the importance of balancing activity with adequate rest periods Outcome: Adequate for Discharge   Problem: Education: Goal: Will be free of psychotic symptoms Outcome: Adequate for Discharge Goal: Knowledge of the prescribed therapeutic regimen will improve Outcome: Adequate for Discharge

## 2020-05-05 NOTE — BHH Suicide Risk Assessment (Signed)
[facility] Discharge Suicide Risk Assessment   Principal Problem: <principal problem not specified> Discharge Diagnoses: Active Problems:   OCD (obsessive compulsive disorder)   Total Time spent with patient: 15 minutes  Musculoskeletal: Strength & Muscle Tone: within normal limits Gait & Station: normal Patient leans: N/A  Psychiatric Specialty Exam: Review of Systems  Psychiatric/Behavioral: The patient is nervous/anxious.   All other systems reviewed and are negative.   Blood pressure 92/68, pulse (!) 108, temperature 98.3 F (36.8 C), temperature source Oral, resp. rate 16, height 5\' 1"  (1.549 m), weight 55.3 kg, last menstrual period 04/22/2020, SpO2 100 %.Body mass index is 23.05 kg/m.  General Appearance: Casual  Eye Contact::  Good  Speech:  Normal Rate409  Volume:  Normal  Mood:  Anxious  Affect:  Congruent  Thought Process:  Coherent and Descriptions of Associations: Intact  Orientation:  Full (Time, Place, and Person)  Thought Content:  Logical  Suicidal Thoughts:  No  Homicidal Thoughts:  No  Memory:  Immediate;   Good Recent;   Good Remote;   Good  Judgement:  Good  Insight:  Fair  Psychomotor Activity:  Normal  Concentration:  Good  Recall:  Good  Fund of Knowledge:Good  Language: Good  Akathisia:  Negative  Handed:  Right  AIMS (if indicated):     Assets:  Desire for Improvement Housing Resilience Social Support Talents/Skills  Sleep:  Number of Hours: 6.5  Cognition: WNL  ADL's:  Intact   Mental Status Per Nursing Assessment::   On Admission:  NA  Demographic Factors:  Adolescent or young adult and Low socioeconomic status  Loss Factors: NA  Historical Factors: Impulsivity  Risk Reduction Factors:   Living with another person, especially a relative and Positive coping skills or problem solving skills  Continued Clinical Symptoms:  Obsessive-Compulsive Disorder  Cognitive Features That Contribute To Risk:  None    Suicide Risk:   Minimal: No identifiable suicidal ideation.  Patients presenting with no risk factors but with morbid ruminations; may be classified as minimal risk based on the severity of the depressive symptoms    Plan Of Care/Follow-up recommendations:  Activity:  ad lib  [phone], MD 05/05/2020, 7:39 AM

## 2020-05-05 NOTE — Progress Notes (Signed)
  [facility] Adult Case Management Discharge Plan :  Will you be returning to the same living situation after discharge:  Yes with mom but will be going to live with sister and Aunt in [place] for next 4 weeks and then return home to mom.  At discharge, do you have transportation home?: Yes,  mom will pick up Do you have the ability to pay for your medications: Yes,  BCBS  Release of information consent forms completed and in the chart;  Patient's signature needed at discharge.  Patient to Follow up at:  Follow-up Information    [facility] Call on 05/23/2020.   Why: You have an appointment for therapy 05/23/20 at 11:00 am, and on 05/31/20 at 10:30 am for medication management.  These appointments will be held Virtually.  **(PLEASE CHECK WITH YOUR INSURANCE PROVIDER TO CONFIRM THAT THEY COVER VIRTUAL APPOINTMENTS)** Contact information: [address] [place] [address] [phone]               Next level of care provider has access to [facility] Link:no  Safety Planning and Suicide Prevention discussed: Yes,  yes  Have you used any form of tobacco in the last 30 days? (Cigarettes, Smokeless Tobacco, Cigars, and/or Pipes): No  Has patient been referred to the Quitline?: N/A patient is not a smoker  Patient has been referred for addiction treatment: N/A  Erin Sons, LCSW 05/05/2020, 9:50 AM

## 2020-08-01 ENCOUNTER — Ambulatory Visit (HOSPITAL_COMMUNITY): Payer: Federal, State, Local not specified - Other | Admitting: Licensed Clinical Social Worker

## 2020-08-08 ENCOUNTER — Ambulatory Visit (HOSPITAL_COMMUNITY): Payer: Federal, State, Local not specified - Other | Admitting: Psychiatry

## 2020-08-26 ENCOUNTER — Ambulatory Visit (HOSPITAL_COMMUNITY)
Admission: EM | Admit: 2020-08-26 | Discharge: 2020-08-26 | Disposition: A | Discharge status: Home / Self Care | Payer: No Payment, Other | Attending: Family | Admitting: Family

## 2020-08-26 ENCOUNTER — Other Ambulatory Visit: Payer: Self-pay

## 2020-08-26 DIAGNOSIS — F429 Obsessive-compulsive disorder, unspecified: Secondary | ICD-10-CM

## 2020-08-26 DIAGNOSIS — R4681 Obsessive-compulsive behavior: Secondary | ICD-10-CM | POA: Insufficient documentation

## 2020-08-26 MED ORDER — SERTRALINE HCL 50 MG PO TABS: 50.0000 mg | ORAL_TABLET | Freq: Every day | ORAL | 0 refills | Status: DC

## 2020-08-26 MED ORDER — TRAZODONE HCL 50 MG PO TABS: 50.0000 mg | ORAL_TABLET | Freq: Every evening | ORAL | 0 refills | Status: DC | PRN

## 2020-08-26 MED ORDER — HYDROXYZINE HCL 25 MG PO TABS: 25.0000 mg | ORAL_TABLET | Freq: Three times a day (TID) | ORAL | 0 refills | Status: DC | PRN

## 2020-08-26 MED ORDER — OLANZAPINE 5 MG PO TABS: 5.0000 mg | ORAL_TABLET | Freq: Every day | ORAL | 0 refills | Status: DC

## 2020-08-26 NOTE — Discharge Instructions (Addendum)

## 2020-08-26 NOTE — ED Provider Notes (Signed)
Behavioral Health Urgent Care Medical Screening Exam  Patient Name: Eileen Rivas MRN: [id number] Date of Evaluation: 08/26/20 Chief Complaint: Chief Complaint/Presenting Problem: Ongoing symptoms associated with Diagnosis:  Final diagnoses:  Obsessive-compulsive disorder, unspecified type    History of Present illness: Eileen Rivas is a 19 y.o. female.  Patient presents voluntarily to [facility] behavioral [facility] center for walk-in assessment.  Patient reports she would like to be restarted on her medications.  Patient reports chronic intrusive thoughts that require her to pray prior to both eating and drinking.  Patient reports feeling that if she continues to pray diligently "nothing bad will happen."  Patient reports experiencing increased desire to pray x1 year.  Patient reports she recently began attending church and feels states "helps a little."  Patient resides in [place] with her mother, mother's husband, sister, aunt and uncle and 2 cousins.  Patient denies access to weapons.  Patient reports she is currently not employed.  Patient denies alcohol and substance use.  Patient endorses average appetite and sleeps approximately 8 to 9 hours/day.  Patient reports she has been diagnosed with obsessive-compulsive disorder.  Patient discharged from [facility] behavioral health in July, 2021.  Patient reports she did not continue to follow-up with outpatient psychiatry, patient reports inability to recall why she did not follow-up with outpatient psychiatry.  Patient recently [facility] [place], has seen counselor 1 time prior to today.  Discussed restarting medications including Vistaril, Zyprexa, Zoloft, and trazodone.  Discussed risk versus benefits as well as side effects of medications.  Verbalizes agreement with plan to restart his medications.  Patient reports plan to follow-up with outpatient psychiatry moving forward.  Patient  offered support and encouragement.  TTS counselor spoke with patient's mother who denies concerns for patient safety.  Patient's mother requests that patient be restarted on medications. Psychiatric Specialty Exam  Presentation  General Appearance:Appropriate for Environment;Casual  Eye Contact:Good  Speech:Clear and Coherent;Normal Rate  Speech Volume:Normal  Handedness:Right   Mood and Affect  Mood:Anxious  Affect:Appropriate;Congruent   Thought Process  Thought Processes:Coherent;Goal Directed  Descriptions of Associations:Intact  Orientation:Full (Time, Place and Person)  Thought Content:Logical;WDL  Hallucinations:None  Ideas of Reference:None  Suicidal Thoughts:No  Homicidal Thoughts:No   Sensorium  Memory:Immediate Good;Recent Good;Remote Good  Judgment:Fair  Insight:Fair   Executive Functions  Concentration:Good  Attention Span:Good  Recall:Good  Fund of Knowledge:Good  Language:Good   Psychomotor Activity  Psychomotor Activity:Normal   Assets  Assets:Communication Skills;Desire for Improvement;Financial Resources/Insurance;Housing;Intimacy;Leisure Time;Physical Health;Resilience;Social Support   Sleep  Sleep:Good  Number of hours: 8.5  Physical Exam: Physical Exam Vitals and nursing note reviewed.  Constitutional:      Appearance: She is well-developed.  HENT:     Head: Normocephalic.  Cardiovascular:     Rate and Rhythm: Normal rate.  Pulmonary:     Effort: Pulmonary effort is normal.  Neurological:     Mental Status: She is alert and oriented to person, place, and time.  Psychiatric:        Attention and Perception: Attention and perception normal.        Mood and Affect: Mood and affect normal.        Speech: Speech normal.        Behavior: Behavior normal. Behavior is cooperative.        Thought Content: Thought content normal.        Cognition and Memory: Cognition and memory normal.        Judgment: Judgment  normal.    Review of Systems  Constitutional: Negative.   HENT: Negative.   Eyes: Negative.   Respiratory: Negative.   Cardiovascular: Negative.   Gastrointestinal: Negative.   Genitourinary: Negative.   Musculoskeletal: Negative.   Skin: Negative.   Neurological: Negative.   Endo/Heme/Allergies: Negative.   Psychiatric/Behavioral: Negative.    Blood pressure 98/62, pulse 86, temperature 98.4 F (36.9 C), temperature source Oral, resp. rate 16, height 5\' 4"  (1.626 m), weight 135 lb (61.2 kg), SpO2 100 %. Body mass index is 23.17 kg/m.  Musculoskeletal: Strength & Muscle Tone: within normal limits Gait & Station: normal Patient leans: N/A   [facility] MSE Discharge Disposition for Follow up and Recommendations: Based on my evaluation the patient does not appear to have an emergency medical condition and can be discharged with resources and follow up care in outpatient services for Medication Management and Individual Therapy  Patient reviewed with Dr. .  Patient discharged with medication prescriptions: -Vistaril 25 mg 3 times daily as needed/anxiety -Zyprexa 5 mg nightly/mood stability -Sertraline 50 mg daily -Trazodone 50 mg nightly as needed/insomnia   [name], FNP 08/26/2020, 1:52 PM

## 2020-08-26 NOTE — ED Notes (Signed)
Pt discharged with f/u care instructions reviewed by writer. Verbalized understanding and agreement to plan of care. Safety maintained.

## 2020-08-26 NOTE — BH Assessment (Signed)
Comprehensive Clinical Assessment (CCA) Screening, Triage and Referral Note  08/26/2020 [name] Patient presents this date voluntary brought in by her mother to [facility] for ongoing OCD and religious preoccupation. Patient was recently discharged from [facility] on 05/05/20 and failed to follow up with OP services at [facility] where she was referred for ongoing medication management. Patient is currently denying any S/I, H/I or AVH. Patient renders limited history to this writer and is observed to be presenting with a flat affect. Patient states after her discharge from [facility] she went to [place] to stay with her Aunt for a vacation that was planned prior to her hospital admission. Patient stated while there she "felt a little better" but soon after returning two weeks ago her symptoms returned with patient reporting she has to "pray before she does anything" being afraid if she doesn't "something bad will happen to her." Patient will not elaborate on "what will happen to her" and just states "something bad." Patient denies any SA use. Patient had no prior psychiatric history before her noted admission on 05/02/20. Patient at that time presented to [facility] with recent onset of significant anxiety and religious preoccupation/obsessive compulsive thoughts. Patient stated at that time the symptoms had started about 3 months prior. Patient gives consent this date for writer to speak with her mother Gayla Medicus [phone] who reports since patient has returned from [place] has not been taking her medications. Patient was also supposed to be seeing a therapist at [facility] [place] although only "went one time." Mother reports patient lacks motivation and "just stays around the house all day." Mother states she does not feel patient is a harm to herself or others. Patient is agreeing to start back on her medications this date and has agreed to be complaint with OP recommendations. Patient is pleasant and  cooperative, however visibly distressed and preoccupied. Patient is anxious although does not appear to be responding to internal stimuli at this time. Arlana Pouch NP will evlaute for possible medication interventions and recommended patient follow up with current OP provider.  [phone]  Chief Complaint:  Chief Complaint  Patient presents with  . Paranoid   Visit Diagnosis: OCD, GAD  Patient Reported Information How did you hear about [place]? Self   Referral name: No data recorded  Referral phone number: No data recorded Whom do you see for routine medical problems? I don't have a doctor   Practice/Facility Name: No data recorded  Practice/Facility Phone Number: No data recorded  Name of Contact: No data recorded  Contact Number: No data recorded  Contact Fax Number: No data recorded  Prescriber Name: No data recorded  Prescriber Address (if known): No data recorded What Is the Reason for Your Visit/Call Today? Ongoing symptoms associated with OCD  How Long Has This Been Causing You Problems? 1-6 months  Have You Recently Been in Any Inpatient Treatment (Hospital/Detox/Crisis Center/28-Day Program)? No   Name/Location of Program/Hospital:No data recorded  How Long Were You There? No data recorded  When Were You Discharged? No data recorded Have You Ever Received Services From [facility] Before? Yes   Who Do You See at [facility]? D/Ced from [facility] on 05/05/20  Have You Recently Had Any Thoughts About Hurting Yourself? No   Are You Planning to Commit Suicide/Harm Yourself At This time?  No  Have you Recently Had Thoughts About Hurting Someone Karolee Ohs? No   Explanation: No data recorded Have You Used Any Alcohol or Drugs in the Past 24  Hours? No   How Long Ago Did You Use Drugs or Alcohol?  No data recorded  What Did You Use and How Much? No data recorded What Do You Feel Would Help You the Most Today? Therapy;Medication  Do You Currently Have a Therapist/Psychiatrist? No   Name  of Therapist/Psychiatrist: No data recorded  Have You Been Recently Discharged From Any Office Practice or Programs? Yes   Explanation of Discharge From Practice/Program:  D/Ced from [facility] on 05/05/20     CCA Screening Triage Referral Assessment Type of Contact: Face-to-Face   Is this Initial or Reassessment? No data recorded  Date Telepsych consult ordered in [facility]:  No data recorded  Time Telepsych consult ordered in [facility]:  No data recorded Patient Reported Information Reviewed? Yes   Patient Left Without Being Seen? No data recorded  Reason for Not Completing Assessment: No data recorded Collateral Involvement: Mother Gayla Medicus [phone]  Does Patient Have a Court Appointed Legal Guardian? No data recorded  Name and Contact of Legal Guardian:  No data recorded If Minor and Not Living with Parent(s), Who has Custody? No data recorded Is CPS involved or ever been involved? Never  Is APS involved or ever been involved? Never  Patient Determined To Be At Risk for Harm To Self or Others Based on Review of Patient Reported Information or Presenting Complaint? No   Method: No data recorded  Availability of Means: No data recorded  Intent: No data recorded  Notification Required: No data recorded  Additional Information for Danger to Others Potential:  No data recorded  Additional Comments for Danger to Others Potential:  No data recorded  Are There Guns or Other Weapons in Your Home?  No data recorded   Types of Guns/Weapons: No data recorded   Are These Weapons Safely Secured?                              No data recorded   Who Could Verify You Are Able To Have These Secured:    No data recorded Do You Have any Outstanding Charges, Pending Court Dates, Parole/Probation? No data recorded Contacted To Inform of Risk of Harm To Self or Others: No data recorded Location of Assessment: [facility]  Does Patient Present under Involuntary Commitment? No   IVC  Papers Initial File Date: No data recorded  [place] of Residence: [place]  Patient Currently Receiving the Following Services: Not Receiving Services   Determination of Need: Emergent (2 hours)   Options For Referral: Outpatient Therapy   Alfredia Ferguson, LCAS

## 2020-08-26 NOTE — ED Triage Notes (Signed)
19 yo female, accompanied by mother, walk-in presents with paranoid thinking that something bad is going to happen to her. Pt states, "I have to pray before I do anything because God is gonna punish me if I don't. I have OCD and I pray over and over again out of panic and fear". Pt's mother states, "she don't sleep, eat, do anything. All she do is pray all the time. After she pray, she try to eat and then stop to pray all over again out of fear that something is gonna happen to her". During interview, pt repeatedly looks up to sky before answering questions asked and mumbles a prayer. Denies SI/HI. Denies AVH. Denies using drugs.

## 2020-11-08 ENCOUNTER — Ambulatory Visit (HOSPITAL_COMMUNITY)
Admission: EM | Admit: 2020-11-08 | Discharge: 2020-11-08 | Disposition: A | Discharge status: Home / Self Care | Payer: No Payment, Other | Attending: Registered Nurse | Admitting: Registered Nurse

## 2020-11-08 ENCOUNTER — Encounter (HOSPITAL_COMMUNITY): Payer: Self-pay | Admitting: Emergency Medicine

## 2020-11-08 ENCOUNTER — Other Ambulatory Visit: Payer: Self-pay

## 2020-11-08 DIAGNOSIS — F419 Anxiety disorder, unspecified: Secondary | ICD-10-CM | POA: Insufficient documentation

## 2020-11-08 DIAGNOSIS — R450 Nervousness: Secondary | ICD-10-CM | POA: Insufficient documentation

## 2020-11-08 DIAGNOSIS — F429 Obsessive-compulsive disorder, unspecified: Secondary | ICD-10-CM | POA: Diagnosis not present

## 2020-11-08 DIAGNOSIS — R45851 Suicidal ideations: Secondary | ICD-10-CM | POA: Insufficient documentation

## 2020-11-08 DIAGNOSIS — Z760 Encounter for issue of repeat prescription: Secondary | ICD-10-CM | POA: Insufficient documentation

## 2020-11-08 HISTORY — DX: Obsessive-compulsive disorder, unspecified: F42.9

## 2020-11-08 MED ORDER — OLANZAPINE 5 MG PO TABS: 5.0000 mg | ORAL_TABLET | Freq: Every day | ORAL | 0 refills | Status: DC

## 2020-11-08 MED ORDER — HYDROXYZINE HCL 10 MG PO TABS: 10.0000 mg | ORAL_TABLET | Freq: Three times a day (TID) | ORAL | 0 refills | Status: DC | PRN

## 2020-11-08 MED ORDER — SERTRALINE HCL 50 MG PO TABS: 50.0000 mg | ORAL_TABLET | Freq: Every day | ORAL | 0 refills | Status: DC

## 2020-11-08 NOTE — BH Assessment (Signed)
Comprehensive Clinical Assessment (CCA) Note  11/08/2020 Eileen Rivas [id number]   Patient is a 20 year old female presenting voluntarily to [facility] for assessment with a chief complaint of increased OCD symptoms and panic. Patient reports she was diagnosed with OCD roughly 1 year ago and took medications for a period of time. She states ran out roughly 1 month ago and since then her intrusive thoughts (obsessions) have become more persistent. Her compulsion is to pray so God does not hurt her. She denies SI/HI/AVH. Patient does seem to be hyper-religious and somewhat delusional. Patient denies any substance use issues.   Per Assunta Found, NP patient does not meet in patient care criteria and is psych cleared. Patient provided with outpatient resources.  Chief Complaint:  Chief Complaint  Patient presents with  . Obsessive Compulsive disorder    Pt presented to [facility] as a walk-in with her mother with complaints of being OCD. Pt stated that she needs more medicine so she does not panic. Pt stated that she run out of her medication and she is worse now. Pt denies SI, HI and AVH at this time.    Visit Diagnosis: F42.0 Obsessive Compulsive Disorder   CCA Screening, Triage and Referral (STR)  Patient Reported Information How did you hear about [place]? Family/Friend (Phreesia 11/08/2020)  Referral name: Barbette Reichmann [facility] 11/08/2020)  Referral phone number: No data recorded  Whom do you see for routine medical problems? I don't have a doctor (Phreesia 11/08/2020)  Practice/Facility Name: No data recorded Practice/Facility Phone Number: No data recorded Name of Contact: No data recorded Contact Number: No data recorded Contact Fax Number: No data recorded Prescriber Name: No data recorded Prescriber Address (if known): No data recorded  What Is the Reason for Your Visit/Call Today? Because I Have Mental Problems (Phreesia 11/08/2020)  How Long Has This Been Causing You Problems? >  than 6 months (Phreesia 11/08/2020)  What Do You Feel Would Help You the Most Today? Medication (Phreesia 11/08/2020)   Have You Recently Been in Any Inpatient Treatment (Hospital/Detox/Crisis Center/28-Day Program)? No (Phreesia 11/08/2020)  Name/Location of Program/Hospital:No data recorded How Long Were You There? No data recorded When Were You Discharged? No data recorded  Have You Ever Received Services From [facility] Before? Yes (Phreesia 11/08/2020)  Who Do You See at [facility]? I Arelia Longest THe Name Narda Bonds 11/08/2020)   Have You Recently Had Any Thoughts About Hurting Yourself? No (Phreesia 11/08/2020)  Are You Planning to Commit Suicide/Harm Yourself At This time? No (Phreesia 11/08/2020)   Have you Recently Had Thoughts About Hurting Someone Karolee Ohs? No (Phreesia 11/08/2020)  Explanation: No data recorded  Have You Used Any Alcohol or Drugs in the Past 24 Hours? No (Phreesia 11/08/2020)  How Long Ago Did You Use Drugs or Alcohol? No data recorded What Did You Use and How Much? No data recorded  Do You Currently Have a Therapist/Psychiatrist? Yes (Phreesia 11/08/2020)  Name of Therapist/Psychiatrist: Tania (Phreesia 11/08/2020)   Have You Been Recently Discharged From Any Office Practice or Programs? No (Phreesia 11/08/2020)  Explanation of Discharge From Practice/Program: D/Ced from [facility] on 05/05/20     CCA Screening Triage Referral Assessment Type of Contact: Face-to-Face  Is this Initial or Reassessment? No data recorded Date Telepsych consult ordered in [facility]:  No data recorded Time Telepsych consult ordered in [facility]:  No data recorded  Patient Reported Information Reviewed? Yes  Patient Left Without Being Seen? No  Reason for Not Completing Assessment: No data recorded  Collateral Involvement: Mother Gayla MedicusFrancina Alvarez [phone]   Does Patient Have a Court Appointed Legal Guardian? No data recorded Name and Contact of Legal Guardian: No data  recorded If Minor and Not Living with Parent(s), Who has Custody? No data recorded Is CPS involved or ever been involved? Never  Is APS involved or ever been involved? Never   Patient Determined To Be At Risk for Harm To Self or Others Based on Review of Patient Reported Information or Presenting Complaint? No  Method: No data recorded Availability of Means: No data recorded Intent: No data recorded Notification Required: No data recorded Additional Information for Danger to Others Potential: No data recorded Additional Comments for Danger to Others Potential: No data recorded Are There Guns or Other Weapons in Your Home? No data recorded Types of Guns/Weapons: No data recorded Are These Weapons Safely Secured?                            No data recorded Who Could Verify You Are Able To Have These Secured: No data recorded Do You Have any Outstanding Charges, Pending Court Dates, Parole/Probation? No data recorded Contacted To Inform of Risk of Harm To Self or Others: -- (NA)   [facility]   Does Patient Present under Involuntary Commitment? No  IVC Papers Initial File Date: No data recorded  [place]   Patient Currently Receiving the Following Services: Not Receiving Services   Determination of Need: Routine (7 days)   Options For Referral: Medication Management; Outpatient Therapy     CCA Biopsychosocial Intake/Chief Complaint:  NA  Current Symptoms/Problems: NA   Patient Reported Schizophrenia/Schizoaffective Diagnosis in Past: No   Strengths: NA  Preferences: NA  Abilities: NA   Type of Services Patient Feels are Needed: NA   Initial Clinical Notes/Concerns: NA   Mental Health Symptoms Depression:  Change in energy/activity   Duration of Depressive symptoms: No data recorded  Mania:  Racing thoughts   Anxiety:   Difficulty concentrating; Worrying   Psychosis:  Delusions; Affective  flattening/alogia/avolition   Duration of Psychotic symptoms: Greater than six months   Trauma:  None   Obsessions:  Cause anxiety; Intrusive/time consuming; Disrupts routine/functioning; Good insight; Recurrent & persistent thoughts/impulses/images   Compulsions:  Disrupts with routine/functioning; "Driven" to perform behaviors/acts; Good insight; Intended to reduce stress or prevent another outcome; Intrusive/time consuming   Inattention:  None   Hyperactivity/Impulsivity:  N/A   Oppositional/Defiant Behaviors:  None   Emotional Irregularity:  Chronic feelings of emptiness   Other Mood/Personality Symptoms:  No data recorded   Mental Status Exam Appearance and self-care  Stature:  Average   Weight:  Average weight   Clothing:  Casual   Grooming:  Normal   Cosmetic use:  Age appropriate   Posture/gait:  Normal   Motor activity:  Not Remarkable   Sensorium  Attention:  Normal   Concentration:  Preoccupied   Orientation:  X5   Recall/memory:  Normal   Affect and Mood  Affect:  Anxious   Mood:  Anxious   Relating  Eye contact:  Normal   Facial expression:  Depressed   Attitude toward examiner:  Cooperative; Guarded   Thought and Language  Speech flow: Normal   Thought content:  Appropriate to Mood and Circumstances   Preoccupation:  None   Hallucinations:  None   Organization:  No data recorded  Affiliated Computer ServicesExecutive Functions  Fund of  Knowledge:  Fair   Intelligence:  Average   Abstraction:  Normal   Judgement:  Normal   Reality Testing:  Realistic   Insight:  Fair   Decision Making:  Normal   Social Functioning  Social Maturity:  Responsible   Social Judgement:  Normal   Stress  Stressors:  School   Coping Ability:  Overwhelmed   Skill Deficits:  Decision making   Supports:  Family; Friends/Service system     Religion: Religion/Spirituality Are You A Religious Person?: Yes How Might This Affect Treatment?: religious  preoccupation - God vs demonic forces  Leisure/Recreation: Leisure / Recreation Do You Have Hobbies?: No  Exercise/Diet: Exercise/Diet Do You Exercise?: No Have You Gained or Lost A Significant Amount of Weight in the Past Six Months?: No Do You Follow a Special Diet?: No Do You Have Any Trouble Sleeping?: No   CCA Employment/Education Employment/Work Situation: Employment / Work Situation Employment situation: Unemployed Patient's job has been impacted by current illness: No What is the longest time patient has a held a job?: Seasonal Where was the patient employed at that time?: [employer] Has patient ever been in the [employer]?: No  Education: Education Is Patient Currently Attending School?: Yes School Currently Attending: [employer] Last Grade Completed: 12 (sophomore year) Name of High School: [employer] Did Garment/textile technologist From [employer]?: Yes Did Theme park manager?: Yes What Type of College Degree Do you Have?: studying marketing Did You Attend Graduate School?: No Did You Have Any Special Interests In School?: None reported Did You Have An Individualized Education Program (IIEP): No Did You Have Any Difficulty At School?: No Patient's Education Has Been Impacted by Current Illness: No   CCA Family/Childhood History Family and Relationship History: Family history Are you sexually active?: No What is your sexual orientation?: Straight Has your sexual activity been affected by drugs, alcohol, medication, or emotional stress?: Denies Does patient have children?: No  Childhood History:  Childhood History By whom was/is the patient raised?: Mother/father and step-parent Additional childhood history information: Patient states her parents were not married and separted when she was 1 or 2 and a step-father figure lived with her for a period.  She lives with her mother and other relatives, including a younger sister. Description of patient's relationship  with caregiver when they were a child: States that she felt misunderstood by mother How were you disciplined when you got in trouble as a child/adolescent?: "Yelled at, hit" Does patient have siblings?: Yes Number of Siblings: 1 Description of patient's current relationship with siblings: younger sister Did patient suffer any verbal/emotional/physical/sexual abuse as a child?: No Did patient suffer from severe childhood neglect?: No Has patient ever been sexually abused/assaulted/raped as an adolescent or adult?: No Was the patient ever a victim of a crime or a disaster?: No Witnessed domestic violence?: No Has patient been affected by domestic violence as an adult?: No  Child/Adolescent Assessment:     CCA Substance Use Alcohol/Drug Use: Alcohol / Drug Use Pain Medications: See MAR Prescriptions: See MAR Over the Counter: See MAR History of alcohol / drug use?: No history of alcohol / drug abuse                         ASAM's:  Six Dimensions of Multidimensional Assessment  Dimension 1:  Acute Intoxication and/or Withdrawal Potential:      Dimension 2:  Biomedical Conditions and Complications:      Dimension 3:  Emotional,  Behavioral, or Cognitive Conditions and Complications:     Dimension 4:  Readiness to Change:     Dimension 5:  Relapse, Continued use, or Continued Problem Potential:     Dimension 6:  Recovery/Living Environment:     ASAM Severity Score:    ASAM Recommended Level of Treatment:     Substance use Disorder (SUD)    Recommendations for Services/Supports/Treatments:    DSM5 Diagnoses: Patient Active Problem List   Diagnosis Date Noted  . Medication refill 11/08/2020  . OCD (obsessive compulsive disorder) 05/03/2020  . Cystitis 09/15/2019    Patient Centered Plan: Patient is on the following Treatment Plan(s):   Referrals to Alternative Service(s): Referred to Alternative Service(s):   Place:   Date:   Time:    Referred to  Alternative Service(s):   Place:   Date:   Time:    Referred to Alternative Service(s):   Place:   Date:   Time:    Referred to Alternative Service(s):   Place:   Date:   Time:     Celedonio Miyamoto, LCSW

## 2020-11-08 NOTE — ED Provider Notes (Signed)
Behavioral Health Urgent Care Medical Screening Exam  Patient Name: Keirstan Iannello MRN: [id number] Date of Evaluation: 11/08/20 Chief Complaint: Chief Complaint/Presenting Problem: NA Diagnosis:  Final diagnoses:  Medication refill  Obsessive-compulsive disorder, unspecified type    History of Present illness: Ival Basquez is a 20 y.o. female patient presented to [facility] as a walk in complaints of Obsessive intrusive thoughts.  [name], 20 y.o., female patient seen face to face by this provider, consulted with Dr. Bronwen Betters; and chart reviewed on 11/08/20.  On evaluation Stephnie Parlier reports she has been off of her medications that were started during her last psychiatric admission and since she has been off of medications intrusive thoughts have worsened and have now started to affect her daily functions.  Patient has outpatient [facility] services at [facility] for therapy only never started medication management.  Patient has been out of her medications for one month.   During evaluation Debbi Strandberg is sitting up right in chair in no acute distress.  She is alert, oriented x 4, calm and cooperative.  Her mood is anxious with congruent affect.  She does not appear to be responding to internal/external stimuli or delusional thoughts.  Patient denies suicidal/self-harm/homicidal ideation, psychosis, and paranoia.  Patient answered question appropriately.  Patient informed would give one month of medications Zyprexa and Zoloft but she would need to set up for medication management at [facility] if unable to be seen with in month can follow up at [facility]       Psychiatric Specialty Exam  Presentation  General Appearance:Appropriate for Environment; Casual  Eye Contact:Good  Speech:Clear and Coherent; Normal Rate  Speech Volume:Normal  Handedness:Right   Mood and Affect  Mood:Anxious  Affect:Appropriate; Congruent   Thought Process  Thought  Processes:Coherent; Goal Directed  Descriptions of Associations:Intact  Orientation:Full (Time, Place and Person)  Thought Content:WDL  Hallucinations:None  Ideas of Reference:None  Suicidal Thoughts:Yes, Passive (Reports SI thought related to frustration of her situation but really doesn't want to kill her self, no plan or intent) Without Intent; Without Plan  Homicidal Thoughts:No   Sensorium  Memory:Immediate Good; Recent Good  Judgment:Intact  Insight:Present   Executive Functions  Concentration:Good  Attention Span:Good  Recall:Good  Fund of Knowledge:Good  Language:Good   Psychomotor Activity  Psychomotor Activity:Normal   Assets  Assets:Communication Skills; Desire for Improvement; Housing; Resilience; Social Support; Transportation   Sleep  Sleep:Good  Number of hours: 8.5   Physical Exam: Physical Exam Vitals and nursing note reviewed. Exam conducted with a chaperone present.  Constitutional:      General: She is not in acute distress.    Appearance: Normal appearance. She is not ill-appearing.  HENT:     Head: Normocephalic and atraumatic.  Eyes:     Pupils: Pupils are equal, round, and reactive to light.  Cardiovascular:     Rate and Rhythm: Normal rate and regular rhythm.  Pulmonary:     Effort: Pulmonary effort is normal.     Breath sounds: Normal breath sounds.  Musculoskeletal:        General: Normal range of motion.     Cervical back: Normal range of motion.  Skin:    General: Skin is warm and dry.  Neurological:     Mental Status: She is alert and oriented to person, place, and time.  Psychiatric:        Attention and Perception: Attention and perception normal. She does not perceive auditory or visual hallucinations.  Mood and Affect: Affect normal. Mood is anxious.        Speech: Speech normal.        Behavior: Behavior normal. Behavior is cooperative.        Thought Content: Thought content normal. Thought  content is not paranoid or delusional. Thought content does not include homicidal or suicidal ideation.        Cognition and Memory: Cognition and memory normal.        Judgment: Judgment normal.    Review of Systems  Constitutional: Negative.   HENT: Negative.   Eyes: Negative.   Respiratory: Negative.   Cardiovascular: Negative.   Gastrointestinal: Negative.   Genitourinary: Negative.   Musculoskeletal: Negative.   Skin: Negative.   Neurological: Negative.   Endo/Heme/Allergies: Negative.   Psychiatric/Behavioral: Negative for hallucinations, substance abuse and suicidal ideas. Depression: Stable. The patient is nervous/anxious. The patient does not have insomnia.    Pulse 83, temperature 97.7 F (36.5 C), temperature source Tympanic, resp. rate 16, SpO2 100 %. There is no height or weight on file to calculate BMI.  Musculoskeletal: Strength & Muscle Tone: within normal limits Gait & Station: normal Patient leans: N/A   [facility] MSE Discharge Disposition for Follow up and Recommendations: Based on my evaluation the patient does not appear to have an emergency medical condition and can be discharged with resources and follow up care in outpatient services for Medication Management and Individual Therapy   Follow-up Information    [facility] Follow up.   Specialty: Behavioral Health Contact information: [address] [place] [address] [phone]       [facility] Follow up.   Specialty: Professional Counselor Why: If unable to schedule with [facility]; you can.  Follow up with organization to schedule medication managment  assistance.  You will need to call to schedule appointment for medication management.  Keep scheduled appointment for therapy Contact information: [address] [place] [address] [phone]               Assunta Found, NP 11/08/2020, 4:00 PM

## 2020-11-08 NOTE — ED Triage Notes (Signed)
Pt presented to [facility] as a walk-in with her mother with complaints of being OCD. Pt stated that she needs more medicine so she does not panic. Pt stated that she run out of her medication and she is worse now. Pt denies SI, HI and AVH at this time.

## 2020-11-08 NOTE — ED Notes (Signed)
Discharge instructions provided and Pt stated understanding. Prescriptions provided. Personal belongings returned and Pt escorted to the front lobby. Pt alert, orient and ambulatory. Safety maintained.

## 2020-11-09 ENCOUNTER — Telehealth (HOSPITAL_COMMUNITY): Payer: Self-pay | Admitting: Clinical

## 2020-11-09 ENCOUNTER — Telehealth (HOSPITAL_COMMUNITY): Payer: Self-pay | Admitting: Family Medicine

## 2020-11-09 ENCOUNTER — Ambulatory Visit (HOSPITAL_COMMUNITY): Payer: Federal, State, Local not specified - Other | Admitting: Clinical

## 2020-11-09 NOTE — Telephone Encounter (Signed)
Therapist sent the client a text message link for the scheduled outpatient therapy visit via MyChart. Client did not check in using the link. Therapist attempted to call the clients phone. Client did not answer. Therapist unable to leave a voice mail due to mail box being full.

## 2020-11-09 NOTE — Telephone Encounter (Signed)
Care Management - Follow Up [facility] Discharges   Writer attempted to make contact with patient today and was unsuccessful.  Patient voice mail is not set up.  

## 2021-07-22 IMAGING — DX DG SACRUM/COCCYX 2+V
3 series · 3 of 3 positions shown · non-contrast
Comparison: None.

CLINICAL DATA: Low back pain for several months following heavy
lifting, initial encounter

EXAM:
SACRUM AND COCCYX - 2+ VIEW

[sacrum 20° caudo-cranial ap]
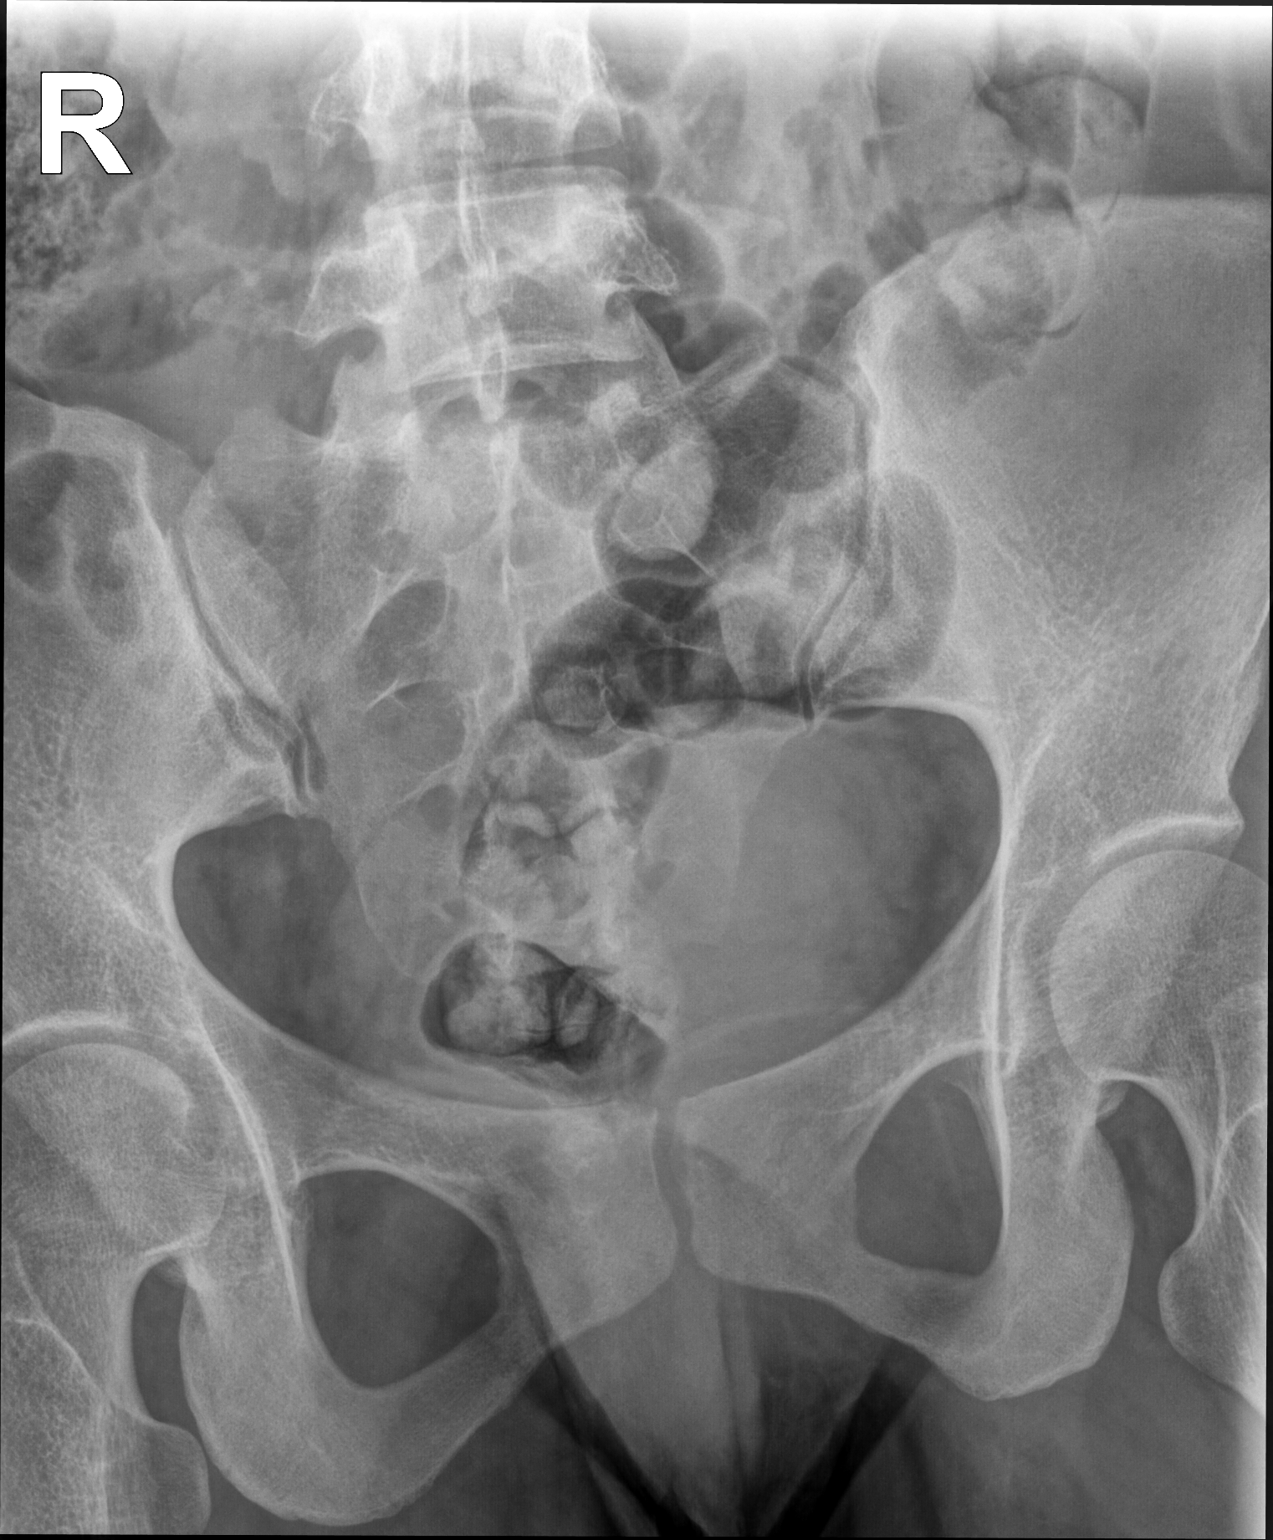

[sacrum lat]
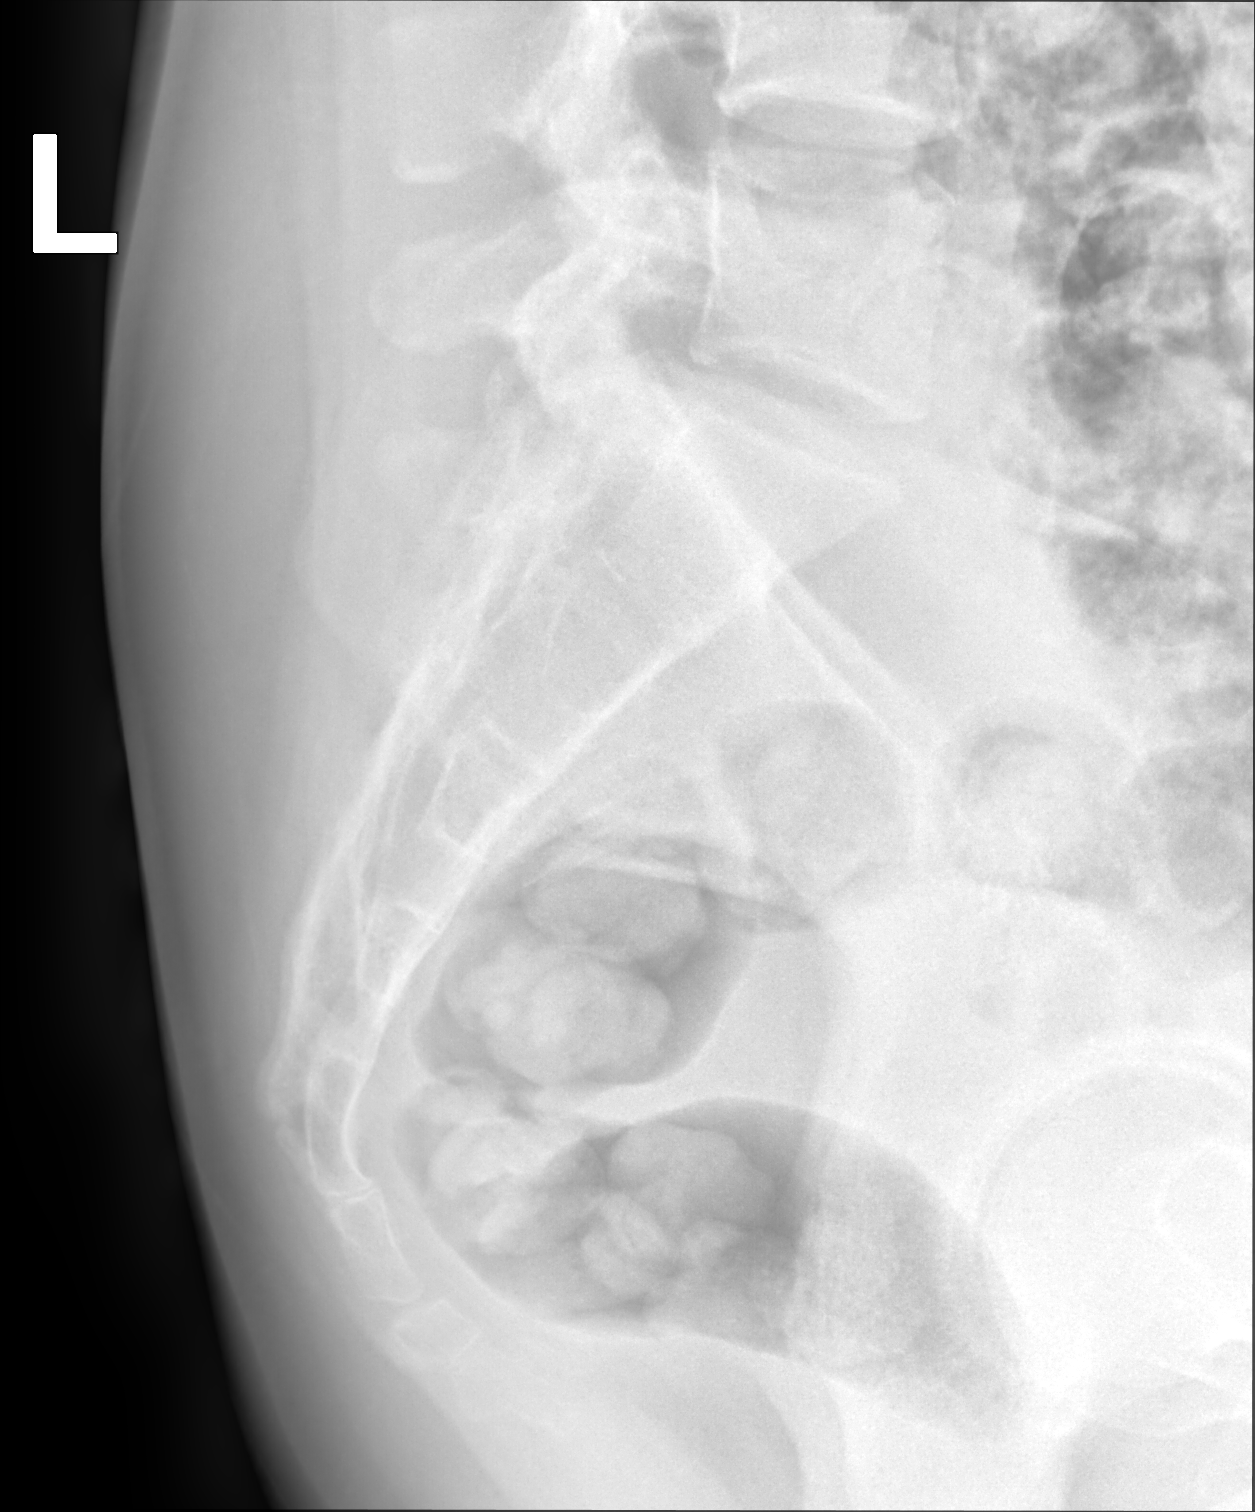

[coccyx 20° cranio-caudal ap]
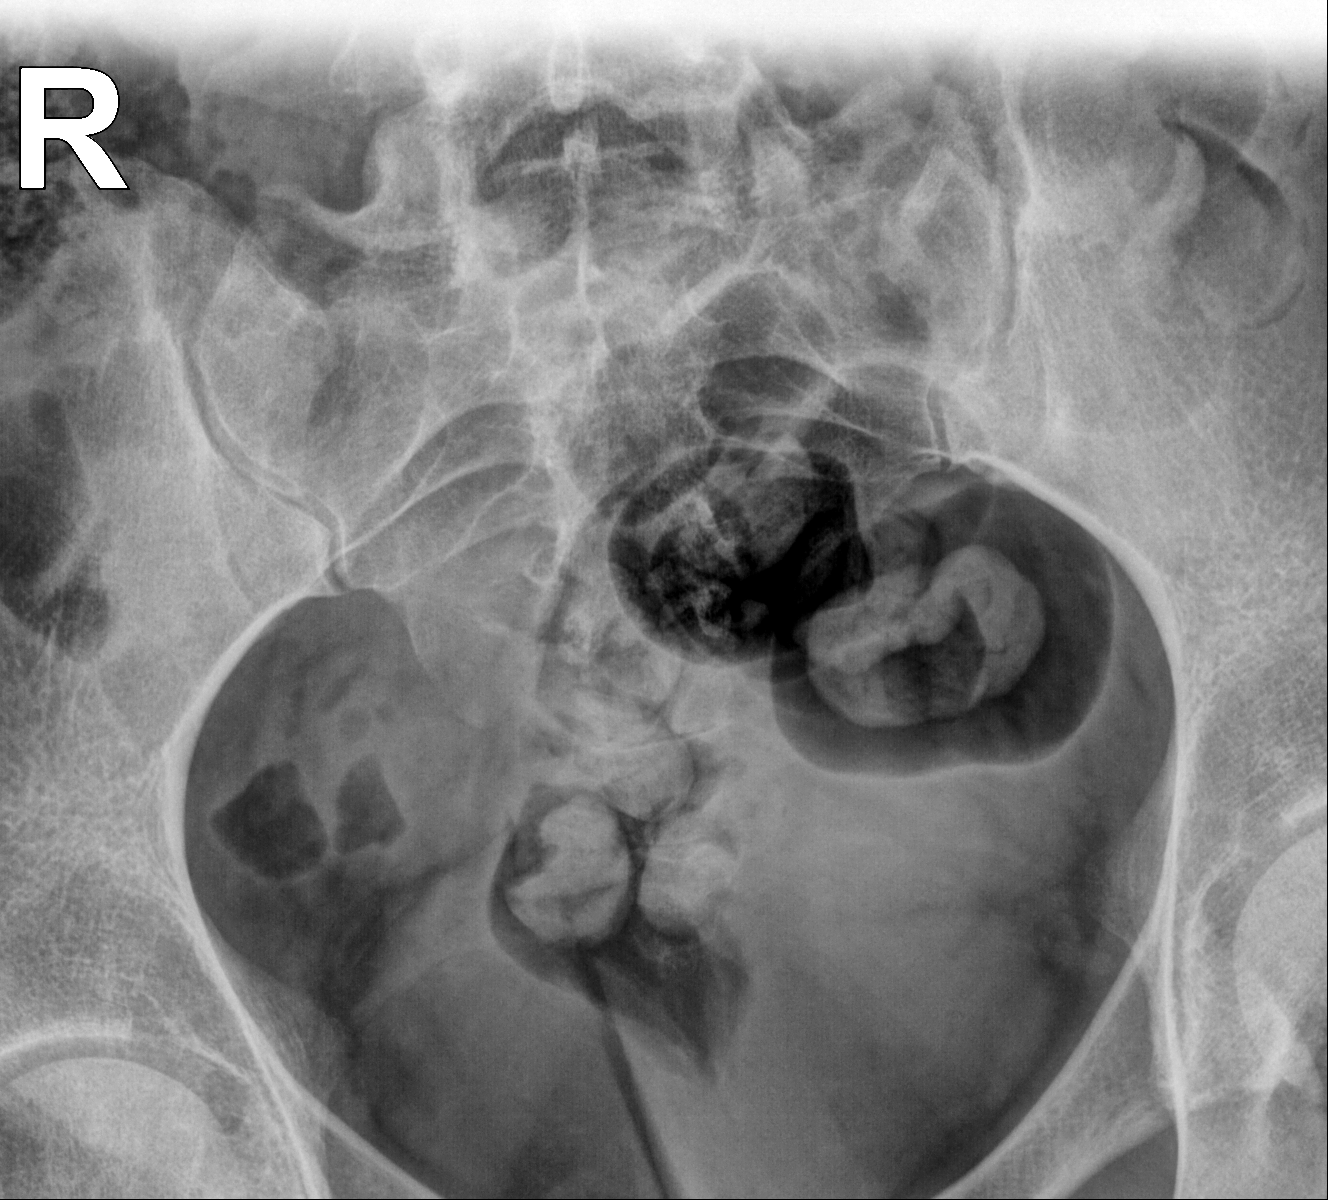

[3 of 3 positions shown; findings below may reference images not displayed]

FINDINGS: Sacroiliac joints are widely patent. The sacrum appears within
normal limits. No fracture is seen. The sacral ala unremarkable.
IMPRESSION: No acute abnormality noted.

## 2021-09-06 ENCOUNTER — Ambulatory Visit: Payer: Medicaid Other | Admitting: Family Medicine

## 2021-11-16 ENCOUNTER — Ambulatory Visit: Payer: Medicaid Other | Admitting: Family Medicine

## 2022-07-24 ENCOUNTER — Ambulatory Visit (INDEPENDENT_AMBULATORY_CARE_PROVIDER_SITE_OTHER): Payer: No Payment, Other | Admitting: Student in an Organized Health Care Education/Training Program

## 2022-07-24 ENCOUNTER — Encounter (HOSPITAL_COMMUNITY): Payer: Self-pay | Admitting: Student in an Organized Health Care Education/Training Program

## 2022-07-24 ENCOUNTER — Ambulatory Visit (INDEPENDENT_AMBULATORY_CARE_PROVIDER_SITE_OTHER): Payer: No Payment, Other | Admitting: Licensed Clinical Social Worker

## 2022-07-24 VITALS — BP 89/60 | HR 67 | Resp 12 | Wt 165.0 lb

## 2022-07-24 DIAGNOSIS — F251 Schizoaffective disorder, depressive type: Secondary | ICD-10-CM | POA: Diagnosis not present

## 2022-07-24 DIAGNOSIS — F422 Mixed obsessional thoughts and acts: Secondary | ICD-10-CM | POA: Diagnosis not present

## 2022-07-24 MED ORDER — FLUOXETINE HCL 20 MG PO CAPS: 20.0000 mg | ORAL_CAPSULE | Freq: Every day | ORAL | 2 refills | Status: DC

## 2022-07-24 NOTE — Progress Notes (Unsigned)
Voncile reports that she would like to work on her depression. Finding joy and doing things I like to do like dancing and sewing.

## 2022-07-24 NOTE — Progress Notes (Signed)
Psychiatric Initial Adult Assessment   Patient Identification: Eileen Rivas MRN:  [id number] Date of Evaluation:  07/24/2022 Referral Source: [facility] Chief Complaint:   Chief Complaint  Patient presents with   Establish Care   Visit Diagnosis:    ICD-10-CM   1. Mixed obsessional thoughts and acts  F42.2 FLUoxetine (PROZAC) 20 MG capsule    2. Schizoaffective disorder, depressive type (HCC)  F25.1 FLUoxetine (PROZAC) 20 MG capsule      History of Present Illness:  Eileen Rivas is a 21 year old, [place]ian-American, patient with a PPH of schizophrenia and OCD who presents today to establish care after her previous outpatient psychiatrist, Dr. Rowe Robert, left her practice.  Patient continues to [facility] at [facility] for her primary [facility].  Patient presents today with her mother who she also lives with.  Neither patient nor mother could recall patient's medications however, we were able to reach "[facility]" during assessment.  Per staff there patient received the following:  Risperdal Consta 71m (last injection 07/23/2022) q 14 days-per staff patient had missed multiple injections prior Prozac 20 mg daily Cogentin 0.5 mg daily, started approximately 2 weeks ago  On assessment today patient reports that she is "sad a lot" when asked about her mood.  Mom endorsed that patient is also more irritable at times.  Patient reports that without her medications she has problems staying asleep.  Patient reports that on the medicine she is now having problems falling asleep.  Patient's mother reports that she is falling asleep around 3-4 AM and will wake up around 1 PM.  Patient endorses significant anhedonia, feelings of hopelessness, low energy, poor concentration and increased appetite.  Patient and mother report that patient has gained a significant amount of weight since starting medications and continues to hyperphagia.  Mom reports  that the hyperphasia has had some improvement since patient was on Zyprexa in the past.  Patient reports that she is having no SI or passive SI today however she endorses having thoughts of wanting to open the car door while the car was moving 2-3 weeks ago.  Patient reports that "it was not hard" to control the urge when she realized it was bad.  Patient then goes on to say that her friend told her that she likely would "not have died the loss limbs."  Approximately 3 minutes later patient repeats this exact same phrase, and is surprised when provider tells her that she has already made this statement.  Patient's mother reports that she is concerned about patient's memory.  Mom reports that patient may forget things that happened yesterday.  Patient herself endorses that she cannot remember 2 years ago very well.  Mom reports that patient will repeat herself multiple times throughout the day unaware that she has already made the statement.  Mom reports that she started noticing the short-term memory issues approximately 2 months ago.  Mom reports that she is also concerned about patient's compulsive behaviors.  Mom reports that patient spends excessive amount of times washing dishes and is obsessed with closing the car door "hard" and turning things off.  Patient endorses that she obsesses about leftover food contaminating dishes and new food to be placed in the dishes.  Mom reports that patient will often want to use plastic ware.  Patient reports that she feels she must close the car door hard because "someone will rub the car" if she does not.  Patient endorses that if she  does not close the car door hard enough to her content she will have to opening again to do it.  Patient endorses the same thing with turning off the sink however does not give a reason for why she feels she has to turn the sink off in a special manner.  Patient's mother endorses the patient also has a history of spending excessive amounts  of time in the shower prior to starting medication.  Patient endorses paranoia and delusions.  Patient's mother reports that last week, patient suddenly began thinking she was going to be kicked out of the home and the mom had to reassure her.  Mom reports is not sure if patient suddenly got this thought.  Patient reports that sometimes she can forget who she is talking to the middle of talking to the and this leads to her becoming very anxious and paranoid.  Patient reports that she feels that her medication her visual hallucinations have decreased but does endorse occasional delusions.  Mom reports the patient had hallucinations as recently as last week and endorses that they are significantly worsened when patient misses her medications.  Mom also endorses that patient will become much more tearful when she misses her medication.  Patient does not endorse any history concerning for previous manic or hypomanic episodes.  Patient reports she is not spending as much time as in the past thinking about religion her prayers.  Patient denies any symptoms concerning for PTSD.  Patient does endorse that she has had sudden uncontrolled movements of her tongue 1 in the last 2 months.  Patient's mother endorses the patient was started on Cogentin for these movements.  Patient endorses that she is not really aware when she has these uncontrolled movements and that her family makes her aware.  Patient reports she has been on Risperdal Consta for approximately a year.  Associated Signs/Symptoms: Depression Symptoms:  depressed mood, anhedonia, psychomotor retardation, fatigue, feelings of worthlessness/guilt, difficulty concentrating, hopelessness, impaired memory, anxiety, weight gain, increased appetite, (Hypo) Manic Symptoms:   Denies Anxiety Symptoms:  Obsessive Compulsive Symptoms:   Checking,, Psychotic Symptoms:  Delusions, Hallucinations: Visual Paranoia, PTSD Symptoms: NA  Past Psychiatric  History:  Inpatient: [facility] H-04/2020, diagnosed OCD discharged on Zyprexa 5 mg nightly, Zoloft 50 mg daily, trazodone 50 mg nightly Outpatient: Dr. Tammy Sours saw 3 weeks ago, Risperdal Consta 50 mg q. 14 days, Prozac 20 mg, Cogentin 0.5 mg-diagnosed with schizophrenia and OCD No other prior medications known Patient has history of poor compliance hence Risperdal Consta  Previous Psychotropic Medications: Yes   Substance Abuse History in the last 12 months:  No.  Consequences of Substance Abuse: NA  Past Medical History:  Past Medical History:  Diagnosis Date   Anxiety    Depression    Obsessive-compulsive disorder    No past surgical history on file.  Family Psychiatric History:  Per mother, patient's father has history of undiagnosed psychiatric disorder but had frequent paranoid episode started in his early 61s and continues to now.  Family History: No family history on file.  Social History:   Social History   Socioeconomic History   Marital status: Single    Spouse name: Not on file   Number of children: 0   Years of education: Not on file   Highest education level: Not on file  Occupational History   Occupation: student    Comment: [employer]  Tobacco Use   Smoking status: Never   Smokeless tobacco: Never  Vaping Use  Vaping Use: Never used  Substance and Sexual Activity   Alcohol use: Yes    Comment: occassional   Drug use: No   Sexual activity: Never  Other Topics Concern   Not on file  Social History Narrative   Not on file   Social Determinants of Health   Financial Resource Strain: Low Risk  (07/24/2022)   Overall Financial Resource Strain (CARDIA)    Difficulty of Paying Living Expenses: Not hard at all  Food Insecurity: No Food Insecurity (07/24/2022)   Hunger Vital Sign    Worried About Running Out of Food in the Last Year: Never true    Ran Out of Food in the Last Year: Never true  Transportation Needs: No Transportation Needs  (07/24/2022)   PRAPARE - Hydrologist (Medical): No    Lack of Transportation (Non-Medical): No  Physical Activity: Inactive (07/24/2022)   Exercise Vital Sign    Days of Exercise per Week: 0 days    Minutes of Exercise per Session: 0 min  Stress: Stress Concern Present (07/24/2022)   [place]    Feeling of Stress : To some extent  Social Connections: Socially Isolated (07/24/2022)   Social Connection and Isolation Panel [NHANES]    Frequency of Communication with Friends and Family: More than three times a week    Frequency of Social Gatherings with Friends and Family: Not on file    Attends Religious Services: Never    Marine scientist or Organizations: No    Attends Music therapist: Never    Marital Status: Never married    Additional Social History:  -Lives with mother - Mother tries to make sure patient has some social interaction with her female family such as going out to parties, or being around them - Patient is unable to work currently - Patient had to drop out of [employer] due to for psychotic break during her junior year, 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 - Patient is currently in a romantic relationship online with a boy in [place], that she is never met in person - 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, 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 30 capsule 2   risperiDONE microspheres (RISPERDAL CONSTA) 25 MG injection Inject 50 mg into the muscle every 14 (fourteen) days.     hydrOXYzine (ATARAX/VISTARIL) 10 MG tablet Take 1 tablet (10 mg total) by mouth 3 (three) times daily as needed. 30 tablet 0   No current facility-administered medications for this visit.    Musculoskeletal: Strength & Muscle Tone: within normal limits Gait & Station: normal Patient leans: N/A  Psychiatric Specialty Exam: Review of Systems  Psychiatric/Behavioral:  Positive for confusion, decreased concentration and dysphoric mood. Negative for agitation, sleep disturbance and suicidal ideas.     Blood pressure (!) 89/60,  pulse 67, resp. rate 12, weight 165 lb (74.8 kg), SpO2 100 %.Body mass index is 28.32 kg/m.  General Appearance: Casual, masked facies  Eye Contact:  Good  Speech:  Clear and Coherent  Volume:  Decreased monotone  Mood:  Dysphoric  Affect:  Blunt  Thought Process:  Linear  Orientation:  Full (Time, Place, and Person)  Thought Content:  Rumination  Suicidal Thoughts:  No  Homicidal Thoughts:  No  Memory:  Immediate;   Poor  Judgement:  Impaired  Insight:  Shallow  Psychomotor Activity:  Psychomotor Retardation  Concentration:  Concentration: Fair, able to calculate 4 quarters in $ 1 and 11 quarters in $2.75, but did not get MOYB correct, repeated September and skipped March  Recall:  Poor  Fund of Knowledge:Good  Language: Good  Akathisia:  No  Handed:    AIMS (if indicated):  done  Assets:  Communication Skills Desire for Improvement Housing Leisure Time Resilience Social Support Vocational/Educational  ADL's:  Intact  Cognition: Impaired,  Mild  Sleep:  Good   Screenings: [place] Office Visit from 07/24/2022 in [facility] Admission (Discharged) from  05/03/2020 in [facility]  AIMS Total Score 2 0      AUDIT    Flowsheet Row Admission (Discharged) from 05/03/2020 in [facility]  Alcohol Use Disorder Identification Test Final Score (AUDIT) 0      PHQ2-9    [place] Office Visit from 07/24/2022 in [facility] ED from 11/08/2020 in [facility] ED from 05/02/2020 in [facility]  PHQ-2 Total Score 4 2 3   PHQ-9 Total Score 13 12 15       [facility] ED from 11/08/2020 in [facility] Admission (Discharged) from 05/03/2020 in [facility]  C-SSRS RISK CATEGORY High Risk No Risk       Assessment and Plan: Breniya Goertzen is a 21 year old, [place]-American, patient with a PPH of schizophrenia and OCD.  Patient appears to have TD likely secondary to Risperdal Consta.  Unfortunately, patient just received medication yesterday and would not be able to start new antipsychotic medication at this time.  Abilify may be most likely choice at this time due to [place] and slightly lowered chance of increased weight gain.  Patient does appear to benefit from respite all as it has helped decrease some of her ruminative thoughts however she continues to have obsessive compulsiveness in terms of her actions.  Patient also has noticed improvement in regards to her auditory and visual hallucinations however she used endorsing a vision illusions.  Patient also has a history of poor compliance even with LAI, which again is concerning considering severity of psychosis.  Patient appears to have schizophrenia with a likely poor prognosis as she is endorsing significant memory impairment.  Thus far there does not appear to be any head CT and this may be worthwhile considering.  Patient short-term memory loss is a bit odd considering patient's first psychotic  break was approximately 2 years ago and patient received medication at the onset.  Per mom, patient's father is able to function in society despite his paranoid episodes.  Patient also appears to have significant mood component suggesting she may suffer from schizoaffective disorder, depressive type.  Schizophrenia, unspecified (R/O schizoaffective disorder, depressive type) OCD - Discontinue patient's Risperdal Consta, consider Abilify - Continue Prozac 20 mg -  Provided patient's mother with information for "[place]" to increase socialization patient  Tardive dyskinesia - Discontinue Cogentin 0.5 mg - Continue to monitor  Follow-up with patient approximately 2 weeks  Collaboration of Care:   Patient/Guardian was advised Release of Information must be obtained prior to any record release in order to collaborate their care with an outside provider. Patient/Guardian was advised if they have not already done so to contact the registration department to sign all necessary forms in order for [place] to release information regarding their care.   Consent: Patient/Guardian gives verbal consent for treatment and assignment of benefits for services provided during this visit. Patient/Guardian expressed understanding and agreed to proceed.   PGY-3 Freida Busman, MD 10/3/20234:21 PM

## 2022-07-25 NOTE — Progress Notes (Signed)
Comprehensive Clinical Assessment (CCA) Note  07/24/2022 Eileen Rivas [id number]  Chief Complaint: "I have OCD." I want to work on my anxiety.  Visit Diagnosis: Mixed obsessional thoughts and acts    CCA Screening, Triage and Referral (STR)  Patient Reported Information How did you hear about [place]? Other (Comment) (Psychiatrist)  Referral name: Dr. Candie Chroman  Referral phone number: No data recorded  Whom do you see for routine medical problems? No data recorded Practice/Facility Name: No data recorded Practice/Facility Phone Number: No data recorded Name of Contact: No data recorded Contact Number: No data recorded Contact Fax Number: No data recorded Prescriber Name: No data recorded Prescriber Address (if known): No data recorded  What Is the Reason for Your Visit/Call Today? Needs mental health assessment  How Long Has This Been Causing You Problems? > than 6 months  What Do You Feel Would Help You the Most Today? Medication(s)   Have You Recently Been in Any Inpatient Treatment (Hospital/Detox/Crisis Center/28-Day Program)? No  Name/Location of Program/Hospital:No data recorded How Long Were You There? No data recorded When Were You Discharged? No data recorded  Have You Ever Received Services From [facility] Before? Yes  Who Do You See at [facility]? 2022   Have You Recently Had Any Thoughts About Hurting Yourself? No  Are You Planning to Commit Suicide/Harm Yourself At This time? No   Have you Recently Had Thoughts About [place]? No data recorded Explanation: No data recorded  Have You Used Any Alcohol or Drugs in the Past 24 Hours? No  How Long Ago Did You Use Drugs or Alcohol? No data recorded What Did You Use and How Much? No data recorded  Do You Currently Have a Therapist/Psychiatrist? No  Name of Therapist/Psychiatrist: No data recorded  Have You Been Recently Discharged From Any Office Practice or Programs? No  Explanation of  Discharge From Practice/Program: No data recorded    CCA Screening Triage Referral Assessment Type of Contact: Face-to-Face  Is this Initial or Reassessment? No data recorded Date Telepsych consult ordered in [facility]:  No data recorded Time Telepsych consult ordered in [facility]:  No data recorded  Patient Reported Information Reviewed? No data recorded Patient Left Without Being Seen? No data recorded Reason for Not Completing Assessment: No data recorded  Collateral Involvement: Mother Toy Cookey [phone]   Does Patient Have a Court Appointed Legal Guardian? No data recorded Name and Contact of Legal Guardian: No data recorded If Minor and Not Living with Parent(s), Who has Custody? No data recorded Is CPS involved or ever been involved? Never  Is APS involved or ever been involved? Never   Patient Determined To Be At Risk for Harm To Self or Others Based on Review of Patient Reported Information or Presenting Complaint? No  Method: No data recorded Availability of Means: No data recorded Intent: No data recorded Notification Required: No data recorded Additional Information for Danger to Others Potential: No data recorded Additional Comments for Danger to Others Potential: No data recorded Are There Guns or Other Weapons in Your Home? No data recorded Types of Guns/Weapons: No data recorded Are These Weapons Safely Secured?                            No data recorded Who Could Verify You Are Able To Have These Secured: No data recorded Do You Have any Outstanding Charges, Pending Court Dates, Parole/Probation? No data recorded Contacted To Inform of Risk  of Harm To Self or Others: No data recorded  Location of Assessment: [facility]   Does Patient Present under Involuntary Commitment? No  IVC Papers Initial File Date: No data recorded  [place] of Residence: [place]   Patient Currently Receiving the Following Services: Medication  Management   Determination of Need: Routine (7 days)   Options For Referral: Outpatient Therapy; Medication Management     CCA Biopsychosocial Intake/Chief Complaint:  Reports past OCD diagnosis three years ago. She reports that she has complusions to close drawers slowly and move the sink lever slowly; She reports that she has thoughts that make her feel anxious but she does not know what they are.  Current Symptoms/Problems: Eileen Rivas reports that she feels anxious and has recurrent obsessions and impulses to close drawers slowly and move the sink faucet slowly to turn the water on and off.  Patient Reported Schizophrenia/Schizoaffective Diagnosis in Past: No   Strengths: None identified. She is able to handle own ADLs. Preferences: Medication management, therapy Abilities: Sewing, dancing  Type of Services Patient Feels are Needed: No data recorded  Initial Clinical Notes/Concerns: No data recorded  Mental Health Symptoms Depression:   None   Duration of Depressive symptoms: No data recorded  Mania:   None   Anxiety:    Difficulty concentrating; Worrying   Psychosis:   None   Duration of Psychotic symptoms: No data recorded  Trauma:   None   Obsessions:   Cause anxiety; Disrupts routine/functioning; Intrusive/time consuming; Recurrent & persistent thoughts/impulses/images   Compulsions:   Poor Insight; "Driven" to perform behaviors/acts   Inattention:   None   Hyperactivity/Impulsivity:   None   Oppositional/Defiant Behaviors:   None   Emotional Irregularity:   None   Other Mood/Personality Symptoms:  No data recorded   Mental Status Exam Appearance and self-care  Stature:   Average   Weight:   Average weight   Clothing:   Casual   Grooming:   Normal   Cosmetic use:   Age appropriate   Posture/gait:   Normal   Motor activity:   Not Remarkable   Sensorium  Attention:   Normal   Concentration:   Preoccupied; Scattered    Orientation:   X5   Recall/memory:   Normal   Affect and Mood  Affect:   Constricted   Mood:   Euthymic   Relating  Eye contact:   Normal   Facial expression:   Responsive   Attitude toward examiner:   Cooperative   Thought and Language  Speech flow:  Normal   Thought content:   Appropriate to Mood and Circumstances   Preoccupation:   None   Hallucinations:   None   Organization:  No data recorded  [employer] of Knowledge:   Good   Intelligence:   Average   Abstraction:   Normal   Judgement:   Normal   Reality Testing:   Realistic   Insight:   Fair   Decision Making:   Normal   Social Functioning  Social Maturity:   Responsible   Social Judgement:   Normal   Stress  Stressors:  No data recorded  Coping Ability:   Normal   Skill Deficits:   None   Supports:   Family; Friends/Service system     Religion: Religion/Spirituality Are You A Religious Person?: Yes  Leisure/Recreation: Leisure / Recreation Do You Have Hobbies?: Yes Leisure and Hobbies: Sewing, singing, drawing and dancing  Exercise/Diet: Exercise/Diet Do You  Exercise?: No Have You Gained or Lost A Significant Amount of Weight in the Past Six Months?: No Do You Follow a Special Diet?: No Do You Have Any Trouble Sleeping?: Yes Explanation of Sleeping Difficulties: Eileen Rivas reports that has difficulty fallig asleep for the past three or more years   CCA Employment/Education Employment/Work Situation: Employment / Work Situation Employment Situation: Unemployed Patient's Job has Been Impacted by Current Illness: No What is the Longest Time Patient has Held a Job?: Few months Where was the Patient Employed at that Time?: [place] Has Patient ever Been in the [employer]?: No  Education: Education Is Patient Currently Attending School?: No Did Teacher, adult education From [employer]?: Yes Did Physicist, medical?: Yes What Type of College Degree Do  you Have?: Some college; 2 years 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, 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 Did You Have Any Special Interests In School?: None Did You Have An Individualized Education Program (IIEP): No Did You Have Any Difficulty At School?: No Patient's Education Has Been Impacted by Current Illness: No   CCA Family/Childhood History Family and Relationship History: Family history Marital status: Single Has your sexual activity been affected by drugs, alcohol, medication, or emotional stress?: Denies Does patient have children?: No  Childhood History:  Childhood History By whom was/is the patient raised?: Mother/father and step-parent Additional childhood history information: Debbe reports that she was primarily raised by her mother and their relationship close, she is here with her during the visit today. Eileen Rivas reports school was alright and she was quite alot. She reports that she liked math and science while school, did not participate in any extracurricular activites. Description of patient's relationship with caregiver when they were a child: Reports that she thinks her relationship with her mother is "alright" Does patient have siblings?: Yes Number of Siblings: 1 Description of patient's current relationship with siblings: younger sister in the same household; has two half-brothers that she has never met. Did patient suffer any verbal/emotional/physical/sexual abuse as a child?: No Did patient suffer from severe childhood neglect?: No Has patient ever been sexually abused/assaulted/raped as an adolescent or adult?: No Witnessed domestic violence?: No Has patient been affected by domestic violence as an adult?: No      CCA Substance Use Alcohol/Drug Use: Alcohol / Drug Use Pain Medications: None Prescriptions: None Over the Counter: None History of alcohol / drug use?: No history of alcohol / drug abuse                         ASAM's:  Six Dimensions of  Multidimensional Assessment  Dimension 1:  Acute Intoxication and/or Withdrawal Potential:    None  Dimension 2:  Biomedical Conditions and Complications:    None  Dimension 3:  Emotional, Behavioral, or Cognitive Conditions and Complications:   None  Dimension 4:  Readiness to Change:   Action  Dimension 5:  Relapse, Continued use, or Continued Problem Potential:   None  Dimension 6:  Recovery/Living Environment:   None  ASAM Severity Score:    ASAM Recommended Level of Treatment:     Substance use Disorder (SUD)    Recommendations for Services/Supports/Treatments:    DSM5 Diagnoses: Patient Active Problem List   Diagnosis Date Noted   Medication refill 11/08/2020   OCD (obsessive compulsive disorder) 05/03/2020   Cystitis 09/15/2019    Patient Centered Plan & Summary: Patient is on the following Treatment Plan(s):  Anxiety Eileen Rivas is a 21 y/o, single, Hispanic female  presenting for clinical mental health assessment after being referred by her psychiatric provider she sees in office due to her last assessment being done in 2022. Reports past OCD diagnosis three years ago. She reports that she has complusions to close drawers slowly and move the sink lever slowly; She reports that she has thoughts that make her feel anxious but she does not know what they are.  During assessment Eileen Rivas's affect is flat and she looks to her mother often when asked for information by this Clinician. She reports that she has been dealing with OCD symptoms for years. Her mother is able to provide collateral information and stated that Zaila believes her obsessions are related to God and her beliefs. Her mother also reports that she does not talk to friends a lot but she has a friend who is [place] that she does talk to mostly. Anusha denies SI/HI, AVH or psychosis.  [place] is recommended for individual outpatient therapy and medication management. Keylee and her mother are agreeable to this and follow  up appointment is scheduled for 08/14/2022 at 3p.   Collaboration of Care: Medication Management AEB 08/08/2022  Patient/Guardian was advised Release of Information must be obtained prior to any record release in order to collaborate their care with an outside provider. Patient/Guardian was advised if they have not already done so to contact the registration department to sign all necessary forms in order for [place] to release information regarding their care.   Consent: Patient/Guardian gives verbal consent for treatment and assignment of benefits for services provided during this visit. Patient/Guardian expressed understanding and agreed to proceed.   Allyson Sabal, [facility]

## 2022-08-08 ENCOUNTER — Ambulatory Visit (INDEPENDENT_AMBULATORY_CARE_PROVIDER_SITE_OTHER): Payer: No Payment, Other | Admitting: Student in an Organized Health Care Education/Training Program

## 2022-08-08 ENCOUNTER — Encounter (HOSPITAL_COMMUNITY): Payer: Self-pay | Admitting: Student in an Organized Health Care Education/Training Program

## 2022-08-08 VITALS — BP 89/75 | HR 76 | Resp 16

## 2022-08-08 DIAGNOSIS — F250 Schizoaffective disorder, bipolar type: Secondary | ICD-10-CM

## 2022-08-08 DIAGNOSIS — F203 Undifferentiated schizophrenia: Secondary | ICD-10-CM

## 2022-08-08 DIAGNOSIS — F209 Schizophrenia, unspecified: Secondary | ICD-10-CM | POA: Insufficient documentation

## 2022-08-08 DIAGNOSIS — F422 Mixed obsessional thoughts and acts: Secondary | ICD-10-CM | POA: Diagnosis not present

## 2022-08-08 MED ORDER — ARIPIPRAZOLE 10 MG PO TABS: 10.0000 mg | ORAL_TABLET | Freq: Every day | ORAL | 0 refills | Status: DC

## 2022-08-08 NOTE — Progress Notes (Addendum)
BH MD/PA/NP OP Progress Note  08/08/2022 3:33 PM Eileen Rivas  MRN:  [id number]  Chief Complaint:  Chief Complaint  Patient presents with   Follow-up   HPI:  Eileen Rivas is a 21 year old, [place]-American, patient with a PPH of schizophrenia and OCD. Patient reports she has been compliant with her Prozac 20mg  and stopped taking her Cogentin. Patient has not received her Risperdal Consta since the last injection 2 weeks ago. Mom was present again today.   Patient reports that she has been feeling "happier" the last 2 weeks. Mom reports that she believes that the patient has also had less mouth movements. Patient endorses that she would agree. Mom reports that the patient is still sleeping a lot during the day, but she believes this is due to a poor sleep schedule as patient will stay up during the night and go to bed late.   Mom reports that she has also heard patient talking to herself and making weird noises. Patient reports that what her mother is talking about in regards to talking to herself, may be when she is trying to deny the thoughts in her head. Patient reports she still gets thoughts that she does not feel are her own suggesting that she do certain things that are negative. Patient reports she will verbally say "no." Patient reports that she believes that demons are putting thoughts in her head and want her to follow these thoughts.   Patient reports that she may also suddenly have thoughts (intrusive) such as needing to say every letter in a word. Patient reports that this may be what her mom endorses are bizarre sounds. Patient reports that she estimates (by coloring in a circle) she spends about 1/3 of her day with these uncomfortable, thoughts. Patient reports that they will result in compulsions and cannot recall a time where she does not act on these intrusive thoughts. Patient endorses that she does have a "feeling" that something bad may happen if she is not able to complete  the action.   Patient reports that she does not feel that she is receiving special messages from a higher been, but does feel that her thoughts are not her own. Patient denies feeling as though she is being tracked or watched at all times.   Patient denies SI, HI, and VH.   Mom brought labs from 06/28/2022- Pertinent labs include: TSH 3.98 Cholesterol: Total 210/ HDL 42/triglycerides 196/LDL 134 Prolactin 131.9 Vitamin D: 12 B12: 570 WBC 4.33/Hgb 12.4/MCV 85.5/platelets 347  Visit Diagnosis:    ICD-10-CM   1. Schizoaffective disorder, bipolar type (HCC)  F25.0 ARIPiprazole (ABILIFY) 10 MG tablet    2. Mixed obsessional thoughts and acts  F42.2 ARIPiprazole (ABILIFY) 10 MG tablet      Past Psychiatric History: Inpatient: [facility], diagnosed OCD discharged on Zyprexa 5 mg nightly, Zoloft 50 mg daily, trazodone 50 mg nightly Outpatient: Dr. Tammy Sours saw 3 weeks ago, Risperdal Consta 50 mg q. 14 days, Prozac 20 mg, Cogentin 0.5 mg-diagnosed with schizophrenia and OCD No other prior medications known Patient has history of poor compliance hence Risperdal Consta   Last visit: 07/24/2022: Patient had received her Risperdal Consta injection the day prior however she was presenting with TD symptoms.  Recommended discontinuing this medication, also discontinued previously provided Cogentin 0.5 mg.  Continue patient's Prozac 20 mg.  Messaged patient's PCP regarding possible MRI head.  Past Medical History:  Past Medical History:  Diagnosis Date   Anxiety    Depression  Obsessive-compulsive disorder    History reviewed. No pertinent surgical history.  Family Psychiatric History: Per mother, patient's father has history of undiagnosed psychiatric disorder but had frequent paranoid episode started in his early 57s and continues to now.  Family History: History reviewed. No pertinent family history.  Social History:  Social History   Socioeconomic History   Marital status: Single     Spouse name: Not on file   Number of children: 0   Years of education: Not on file   Highest education level: Not on file  Occupational History   Occupation: student    Comment: [employer]  Tobacco Use   Smoking status: Never   Smokeless tobacco: Never  Vaping Use   Vaping Use: Never used  Substance and Sexual Activity   Alcohol use: Yes    Comment: occassional   Drug use: No   Sexual activity: Never  Other Topics Concern   Not on file  Social History Narrative   Not on file   Social Determinants of Health   Financial Resource Strain: Low Risk  (07/24/2022)   Overall Financial Resource Strain (CARDIA)    Difficulty of Paying Living Expenses: Not hard at all  Food Insecurity: No Food Insecurity (07/24/2022)   Hunger Vital Sign    Worried About Running Out of Food in the Last Year: Never true    Ran Out of Food in the Last Year: Never true  Transportation Needs: No Transportation Needs (07/24/2022)   PRAPARE - Administrator, Civil Service (Medical): No    Lack of Transportation (Non-Medical): No  Physical Activity: Inactive (07/24/2022)   Exercise Vital Sign    Days of Exercise per Week: 0 days    Minutes of Exercise per Session: 0 min  Stress: Stress Concern Present (07/24/2022)   [employer] of Occupational Health - Occupational Stress Questionnaire    Feeling of Stress : To some extent  Social Connections: Socially Isolated (07/24/2022)   Social Connection and Isolation Panel [NHANES]    Frequency of Communication with Friends and Family: More than three times a week    Frequency of Social Gatherings with Friends and Family: Not on file    Attends Religious Services: Never    Database administrator or Organizations: No    Attends Engineer, structural: Never    Marital Status: Never married    Allergies: No Known Allergies  Metabolic Disorder Labs: No results found for: "HGBA1C", "MPG" No results found for: "PROLACTIN" No  results found for: "CHOL", "TRIG", "HDL", "CHOLHDL", "VLDL", "LDLCALC" Lab Results  Component Value Date   TSH 10.615 (H) 05/02/2020    Therapeutic Level Labs: No results found for: "LITHIUM" No results found for: "VALPROATE" No results found for: "CBMZ"  Current Medications: Current Outpatient Medications  Medication Sig Dispense Refill   ARIPiprazole (ABILIFY) 10 MG tablet Take 1 tablet (10 mg total) by mouth daily. 30 tablet 0   FLUoxetine (PROZAC) 20 MG capsule Take 1 capsule (20 mg total) by mouth daily. 30 capsule 2   hydrOXYzine (ATARAX/VISTARIL) 10 MG tablet Take 1 tablet (10 mg total) by mouth 3 (three) times daily as needed. 30 tablet 0   No current facility-administered medications for this visit.     Musculoskeletal: Strength & Muscle Tone: within normal limits Gait & Station: normal Patient leans: N/A  CN III, V, VI, VII, IX, X, XI, XII normal Drew Clock well and was able to read the time she chose to put  on the clock.   Psychiatric Specialty Exam: Review of Systems  Blood pressure (!) 89/75, pulse 76, resp. rate 16.There is no height or weight on file to calculate BMI.  General Appearance: Casual  Eye Contact:  Good  Speech:  Clear and Coherent  Volume:  Normal  Mood:  Euthymic  Affect:  Appropriate not blunt, patient able to smile with labial folds obvious symmetrically, patient also able to laugh appropriately today  Thought Process:  Goal Directed patient is very literal  Orientation:  Full (Time, Place, and Person)  Thought Content: Delusions and Computation patient describes her thoughts in a very systematic almost step like manner.  Patient is also very rigid and understanding and does not appear to have good grasp of abstract thoughts, provider has to be very literal with patient  Suicidal Thoughts:  No  Homicidal Thoughts:  No  Memory:  Immediate;   Fair Recent;   Poor  Judgement:  Fair  Insight:  Shallow  Psychomotor Activity:  Normal   Concentration:  Concentration: Fair  Recall:  NA  Fund of Knowledge: Fair  Language: Good  Akathisia:  No  Handed:    AIMS (if indicated): done  Assets:  Communication Skills Desire for Improvement Housing Leisure Time Resilience Social Support Vocational/Educational  ADL's:  Intact  Cognition: impaired?  Sleep:  Fair   Screenings: AIMS    Flowsheet Row Office Visit from 07/24/2022 in [facility]  AIMS Total Score 2 0      AUDIT    Flowsheet Row [facility]  Alcohol Use Disorder Identification Test Final Score (AUDIT) 0      PHQ2-9    Flowsheet Row Office Visit from 07/24/2022 in [facility] ED from 11/08/2020 in [facility] ED from 05/02/2020 in [facility]  PHQ-2 Total Score 4 2 3   PHQ-9 Total Score 13 12 15       Flowsheet Row ED from 11/08/2020 in [facility] (Discharged[facility]  C-SSRS RISK CATEGORY High Risk No Risk        Assessment and Plan: Eileen Rivas is a 21 year old, [place]-American, patient with a PPH of schizophrenia and OCD.  Patient endorses intrusive thoughts of multiple can pulsatory behaviors.  Patient also appears to be delusional with concern that someone else is inside her mind however she also does not feel that she is receiving special messages.  Objectively, patient appeared a bit more animated today, suggesting that the Risperdal Consta may have been causing parkinsonism like features.  Patient's mouth movements have also decreased suggesting that the Cogentin may have been contributing to this being more obvious.  Patient endorsing that her mood has improved may also be  secondary to possible parkinsonism that was occurring.  Patient may benefit from SGA that is a slightly less potent D2 antagonist such as Abilify.  Although patient may have required higher potency when she is fully decompensated, she may need change of medication for maintenance therapy.  Patient's prolactin level also indicates that she should discontinue Risperdal Consta as it is significantly elevated.  Abilify may also have slightly less impact on patient's cholesterol than risperidone.  Patient's compulsive behavior would benefit from increase in her Prozac however due to patient's history of poor compliance, we will minimize medication  adjustments each visit.   Schizophrenia, unspecified (R/O schizoaffective disorder, depressive type) OCD -Continue Prozac 20 mg daily - [facility]" - Start Abilify 10 mg daily, intend to titrate up at next visit or may transition to Abilify Maintena  Tardive dyskinesia - Continue to monitor   Collaboration of Care: Collaboration of Care: Message to PCP  Patient/Guardian was advised Release of Information must be obtained prior to any record release in order to collaborate their care with an outside provider. Patient/Guardian was advised if they have not already done so to contact the registration department to sign all necessary forms in order for [place] to release information regarding their care.   Consent: Patient/Guardian gives verbal consent for treatment and assignment of benefits for services provided during this visit. Patient/Guardian expressed understanding and agreed to proceed.   PGY-3 Bobbye Morton, MD 08/08/2022, 3:33 PM

## 2022-08-14 ENCOUNTER — Ambulatory Visit (HOSPITAL_COMMUNITY): Payer: Federal, State, Local not specified - Other | Admitting: Licensed Clinical Social Worker

## 2022-09-12 ENCOUNTER — Encounter (HOSPITAL_COMMUNITY)
Payer: Federal, State, Local not specified - Other | Admitting: Student in an Organized Health Care Education/Training Program

## 2022-09-25 ENCOUNTER — Ambulatory Visit (INDEPENDENT_AMBULATORY_CARE_PROVIDER_SITE_OTHER): Payer: No Payment, Other | Admitting: Student in an Organized Health Care Education/Training Program

## 2022-09-25 ENCOUNTER — Encounter (HOSPITAL_COMMUNITY): Payer: Self-pay | Admitting: Student in an Organized Health Care Education/Training Program

## 2022-09-25 DIAGNOSIS — F250 Schizoaffective disorder, bipolar type: Secondary | ICD-10-CM

## 2022-09-25 DIAGNOSIS — F251 Schizoaffective disorder, depressive type: Secondary | ICD-10-CM | POA: Diagnosis not present

## 2022-09-25 DIAGNOSIS — F422 Mixed obsessional thoughts and acts: Secondary | ICD-10-CM

## 2022-09-25 MED ORDER — FLUOXETINE HCL 20 MG PO CAPS: 20.0000 mg | ORAL_CAPSULE | Freq: Every day | ORAL | 3 refills | Status: DC

## 2022-09-25 MED ORDER — HYDROXYZINE HCL 25 MG PO TABS: 25.0000 mg | ORAL_TABLET | Freq: Every evening | ORAL | 3 refills | Status: DC | PRN

## 2022-09-25 MED ORDER — ABILIFY MAINTENA 400 MG IM PRSY: 400.0000 mg | PREFILLED_SYRINGE | INTRAMUSCULAR | 12 refills | Status: DC

## 2022-09-25 MED ORDER — ARIPIPRAZOLE 10 MG PO TABS: 10.0000 mg | ORAL_TABLET | Freq: Every day | ORAL | 0 refills | Status: DC

## 2022-09-25 NOTE — Progress Notes (Signed)
BH MD/PA/NP OP Progress Note  09/25/2022 12:37 PM Eileen Rivas  MRN:  [id number]  Chief Complaint:  Chief Complaint  Patient presents with   Follow-up   HPI: Eileen Rivas is a 21 year old, [place]-American, patient with a PPH of schizophrenia and OCD.  Mom was present on assessment again today.  Patient was late for appointment.  Patient and mother endorsed that patient has not been very compliant with her medications especially in the last 2 weeks.  Mom reports that patient had her Prozac at least 2 weeks ago and were not able to get any refills, and patient has been forgetting to take her Abilify.  Patient and mom report they do not believe when patient was more compliant with her Abilify that she had any adverse side effects.  Mom reports that patient continues to make strange noises with her mouth, but patient tells her that this is part of her OCD.  When provider asked patient about this, patient reports that she starts obsessing over something and compulsion is the noise.  Patient reports that she feels as though she does not make a noise "something bad will happen, but I do not want to say out loud."  Patient denies SI, HI.  Patient reports, with input from mom that she does on occasion have disturbing thoughts that tell her to harm others.  Mom reports that she will find patient cried in her room due to having these thoughts.  Patient reports she has no intention of ever acting on these thoughts but they bother her.  Patient reports she is not sure if these are her own thoughts or someone else's.  Patient reports that she still is having visual hallucinations endorsing that she sees a white light flashing coming towards her past.  Patient reports that her energy continues to be low and her sleep cycle remains where she falls asleep at 3 AM and wakes up at 3 PM unless her mother wakes her up.  Patient reports that she does still like to spend time on her phone.  Patient reports that she spends  approximately 1 hour a day brushing her teeth for fear that she is not doing it correctly.  Patient's mother reports that she lost the [facility], but would like to have it again to help patient have more engagement. Visit Diagnosis:    ICD-10-CM   1. Schizoaffective disorder, bipolar type (HCC)  F25.0 ARIPiprazole (ABILIFY) 10 MG tablet    ARIPiprazole ER (ABILIFY MAINTENA) 400 MG PRSY prefilled syringe    2. Mixed obsessional thoughts and acts  F42.2 ARIPiprazole (ABILIFY) 10 MG tablet    FLUoxetine (PROZAC) 20 MG capsule    hydrOXYzine (ATARAX) 25 MG tablet    3. Schizoaffective disorder, depressive type (HCC)  F25.1 FLUoxetine (PROZAC) 20 MG capsule      Past Psychiatric History: Inpatient: [facility], diagnosed OCD discharged on Zyprexa 5 mg nightly, Zoloft 50 mg daily, trazodone 50 mg nightly Outpatient: Dr. Otto Herb saw 3 weeks ago, Risperdal Consta 50 mg q. 14 days, Prozac 20 mg, Cogentin 0.5 mg-diagnosed with schizophrenia and OCD No other prior medications known Patient has history of poor compliance hence Risperdal Consta   Last visit: 07/24/2022: Patient had received her Risperdal Consta injection the day prior however she was presenting with TD symptoms.  Recommended discontinuing this medication, also discontinued previously provided Cogentin 0.5 mg.  Continue patient's Prozac 20 mg.  Messaged patient's PCP regarding possible MRI head.  08/08/2022: Patient was started  on Abilify 10 mg and continuing her Prozac 20 mg.  Patient appeared to have brighter affect, and decrease in parkinsonism symptoms.  Past Medical History:  Past Medical History:  Diagnosis Date   Anxiety    Depression    Obsessive-compulsive disorder    History reviewed. No pertinent surgical history.  Family Psychiatric History: Per mother, patient's father has history of undiagnosed psychiatric disorder but had frequent paranoid episode started in his early 87s and continues to  now.   Family History: History reviewed. No pertinent family history.  Social History:  Social History   Socioeconomic History   Marital status: Single    Spouse name: Not on file   Number of children: 0   Years of education: Not on file   Highest education level: Not on file  Occupational History   Occupation: student    Comment: [employer]  Tobacco Use   Smoking status: Never   Smokeless tobacco: Never  Vaping Use   Vaping Use: Never used  Substance and Sexual Activity   Alcohol use: Yes    Comment: occassional   Drug use: No   Sexual activity: Never  Other Topics Concern   Not on file  Social History Narrative   Not on file   Social Determinants of Health   Financial Resource Strain: Low Risk  (07/24/2022)   Overall Financial Resource Strain (CARDIA)    Difficulty of Paying Living Expenses: Not hard at all  Food Insecurity: No Food Insecurity (07/24/2022)   Hunger Vital Sign    Worried About Running Out of Food in the Last Year: Never true    Ran Out of Food in the Last Year: Never true  Transportation Needs: No Transportation Needs (07/24/2022)   PRAPARE - Hydrologist (Medical): No    Lack of Transportation (Non-Medical): No  Physical Activity: Inactive (07/24/2022)   Exercise Vital Sign    Days of Exercise per Week: 0 days    Minutes of Exercise per Session: 0 min  Stress: Stress Concern Present (07/24/2022)   [place]    Feeling of Stress : To some extent  Social Connections: Socially Isolated (07/24/2022)   Social Connection and Isolation Panel [NHANES]    Frequency of Communication with Friends and Family: More than three times a week    Frequency of Social Gatherings with Friends and Family: Not on file    Attends Religious Services: Never    Marine scientist or Organizations: No    Attends Music therapist: Never    Marital Status:  Never married    Allergies: No Known Allergies  Metabolic Disorder Labs: No results found for: "HGBA1C", "MPG" No results found for: "PROLACTIN" No results found for: "CHOL", "TRIG", "HDL", "CHOLHDL", "VLDL", "LDLCALC" Lab Results  Component Value Date   TSH 10.615 (H) 05/02/2020    Therapeutic Level Labs: No results found for: "LITHIUM" No results found for: "VALPROATE" No results found for: "CBMZ"  Current Medications: Current Outpatient Medications  Medication Sig Dispense Refill   ARIPiprazole ER (ABILIFY MAINTENA) 400 MG PRSY prefilled syringe Inject 400 mg into the muscle every 28 (twenty-eight) days. 1 each 12   ARIPiprazole (ABILIFY) 10 MG tablet Take 1 tablet (10 mg total) by mouth daily. 14 tablet 0   FLUoxetine (PROZAC) 20 MG capsule Take 1 capsule (20 mg total) by mouth daily. 30 capsule 3   hydrOXYzine (ATARAX) 25 MG tablet Take 1  tablet (25 mg total) by mouth at bedtime as needed. 30 tablet 3   No current facility-administered medications for this visit.     Musculoskeletal: Strength & Muscle Tone: within normal limits Gait & Station: normal Patient leans: N/A  Psychiatric Specialty Exam: Review of Systems  Psychiatric/Behavioral:  Positive for decreased concentration and hallucinations. Negative for suicidal ideas.     Blood pressure 106/60, pulse 79, SpO2 100 %.There is no height or weight on file to calculate BMI.  General Appearance: Casual  Eye Contact:  Good  Speech:  Slow  Volume:  Decreased  Mood:  Euthymic  Affect:  Flat, but can smile when asked  Thought Process:  Goal Directed  Orientation:  Full (Time, Place, and Person)  Thought Content: Rumination on medication side effects  Suicidal Thoughts:  No  Homicidal Thoughts:  No  Memory:  Immediate;   Poor Recent;   Poor  Judgement:  Fair  Insight:  Shallow  Psychomotor Activity:  Normal  Concentration:  Concentration: Poor  Recall:  NA  Fund of Knowledge: Fair  Language: Fair   Akathisia:  NA  Handed:    AIMS (if indicated): done  Assets:  Communication Skills Desire for Improvement Housing Resilience Social Support  ADL's:  Intact  Cognition: Impaired,  Mild  Sleep:  Fair   Screenings: AIMS    Flowsheet Row Clinical Support from 09/25/2022 in [place] from 08/08/2022 in [facility] Office Visit from 07/24/2022 in [facility] Admission (Discharged) from 05/03/2020 in [place] [facility]  AIMS Total Score 0 1 2 0      AUDIT    Flowsheet Row Admission (Discharged) from 05/03/2020 in [place] [facility]  Alcohol Use Disorder Identification Test Final Score (AUDIT) 0      PHQ2-9    [place] Office Visit from 07/24/2022 in [facility] ED from 11/08/2020 in [facility] ED from 05/02/2020 in [facility]  PHQ-2 Total Score 4 2 3   PHQ-9 Total Score 13 12 15       [place] ED from 11/08/2020 in [facility] Admission (Discharged) from 05/03/2020 in [place] [facility]  C-SSRS RISK CATEGORY High Risk No Risk        Assessment and Plan:  Eileen Rivas is a 21 year old, [place]ian-American, patient with a PPH of schizophrenia and OCD.  There are discussion with both patient and patient's mother about compliance.  Endorse that patient's OCD symptoms will not improve if she is not taking her medication appropriately, and provider will not be able to recognize whether or not patient needs an increase in dose or change in medication.  Mom endorsed that she will attempt to manage patient's medications at least for the next few days to ensure that patient is taking them.  Provider also suggested that patient take her Prozac before she starts brushing her teeth, as this  is a compulsion that she is doing daily.  Patient endorses that she thought this would be good.  Objectively, the patient did appear to be moving much better today with more affect, and aims score was 0.  We will start patient on Abilify Maintena, due to poor compliance and lack of adverse side effects on p.o. formulation. Schizophrenia, unspecified (R/O schizoaffective disorder, depressive type) OCD -Continue Prozac 20 mg daily - [facility] -  Start Abilify Maintena 400 mg, tomorrow-have instructed patient that she must continue with Abilify 10 mg x 14 days-mom will assist with this  Patient will join shot clinic tomorrow  Collaboration of Care: Collaboration of Care:   Patient/Guardian was advised Release of Information must be obtained prior to any record release in order to collaborate their care with an outside provider. Patient/Guardian was advised if they have not already done so to contact the registration department to sign all necessary forms in order for [place] to release information regarding their care.   Consent: Patient/Guardian gives verbal consent for treatment and assignment of benefits for services provided during this visit. Patient/Guardian expressed understanding and agreed to proceed.   PGY-30 Freida Busman, MD 09/25/2022, 12:37 PM

## 2022-09-25 NOTE — Addendum Note (Signed)
Addended by: [name] B on: 09/25/2022 04:34 PM   Modules accepted: Orders

## 2022-09-26 ENCOUNTER — Encounter (HOSPITAL_COMMUNITY): Payer: Self-pay

## 2022-09-26 ENCOUNTER — Ambulatory Visit (HOSPITAL_COMMUNITY): Payer: No Payment, Other | Admitting: *Deleted

## 2022-09-26 VITALS — BP 109/65 | HR 84 | Ht 64.0 in | Wt 160.0 lb

## 2022-09-26 DIAGNOSIS — F250 Schizoaffective disorder, bipolar type: Secondary | ICD-10-CM

## 2022-09-26 MED ORDER — ARIPIPRAZOLE ER 400 MG IM PRSY
400.0000 mg | PREFILLED_SYRINGE | Freq: Once | INTRAMUSCULAR | Status: AC
  Administered 2022-09-26: 400 mg via INTRAMUSCULAR

## 2022-09-26 NOTE — Progress Notes (Signed)
Patient arrived for Abilify 400 mg  Injection . Tolerated Injection Well in Right Arm Pleasant not Talkative. But smiles & will Speak when necessary . NO HI/SI  NOR AH/VH

## 2022-10-23 ENCOUNTER — Ambulatory Visit (INDEPENDENT_AMBULATORY_CARE_PROVIDER_SITE_OTHER): Payer: No Payment, Other | Admitting: Student in an Organized Health Care Education/Training Program

## 2022-10-23 DIAGNOSIS — F251 Schizoaffective disorder, depressive type: Secondary | ICD-10-CM | POA: Diagnosis not present

## 2022-10-23 DIAGNOSIS — F422 Mixed obsessional thoughts and acts: Secondary | ICD-10-CM

## 2022-10-23 MED ORDER — FLUOXETINE HCL 20 MG PO CAPS: 40.0000 mg | ORAL_CAPSULE | Freq: Every day | ORAL | 3 refills | Status: DC

## 2022-10-23 NOTE — Progress Notes (Signed)
BH MD/PA/NP OP Progress Note  10/23/2022 5:45 PM Eileen Rivas  MRN:  [id number]  Chief Complaint:  Chief Complaint  Patient presents with   Follow-up   HPI: Eileen Rivas is a 22 year old, [place]-American, patient with a PPH of schizophrenia and OCD.  Mom was present on assessment again today.  Patient received her first Abilify Maintena injection and continued her 14-day p.o. bridge.  Patient endorses that she has also been mostly compliant with her Prozac 20 mg daily.  On assessment today patient reports she does not feel much has changed since receiving the Abilify.  Patient reports that she continues to have the obsessive thoughts and compulsions.  Mom endorsed that patient is now showering for an hour and continues to brush her teeth for an hour.  Patient endorsed that she is fearful that if she does not do these things she may lose a tooth.  Patient reports that she is having auditory hallucinations and endorses that she hears another woman's voice in her head telling her that she [the patient] is doing certain things.  Patient is able to provide multiple examples of what the voice says.  Patient reports "it says I am sacrificing my sister's dog to someone that I do not want to."  Patient reports that the voice will also tell her to harm herself but she does not want to act on these thoughts.  Patient reports that she constantly feels as though there is an external force that she trying to push her urge her to harm herself that she is constantly fighting.  Patient denies HI and any recent VH.  Patient reports that the last time she saw the flash of light was approximately 3 weeks ago and only occurred 2 times.  Mom reports that she does still hear patient making the odd sounds, secondary to patient's compulsions.  Patient reports that she thinks she has been doing this less.  Patient reports that she is sleeping well, mom reports that she feels patient may be sleeping more than normal on occasion.   Patient reports that she spends all day on social media and she has been discussing with her mom about her interest in returning to school.  Mom reports that patient has a sister who is studying at [employer], and patient is interested in taking at least 1 class.  Patient, provider, and mom discussed patient's interest in dance, art, and statistics.  It was suggested that maybe patient tried dance, by this provider as it may trigger her obsessive and compulsive behaviors the least.  Patient endorsed that she feels that she may have more control over her obsessive and compulsive behaviors and will try to practice decreasing the time she spends brushing her teeth and bathing.  Patient reports she will also set an alarm for her Prozac.  Mom reports that patient was ranked #13 of 530 in her high school class and has always been extremely intelligent as well as goal oriented and academic.  Mom endorses that she does want to support patient in her academic endeavors but does not want to worsen patient's symptoms, and knows that school has always been important to the patient.  Objectively, patient continues to appear physically improved.  Interestingly, since patient's Risperdal has been discontinued patient's blood pressure has become low normotensive.  Patient was able to smile and laugh at least 1 time during assessment today.  Patient's body movements are much more fluid, and patient appears today walking at an appropriate pace for a  65 year old.  Mom did endorse that patient continues to have memory issues however, based on mom's description today it may be more of patient being compulsive that others hear her when talking to them.  Patient's masked facies is also significantly improving, and patient herself endorses that she feels that her flat affect is more secondary to her facial features rather than true restriction.  Patient's cognition also appeared improved despite patient continue to endorse thought  blocking, patient was able to adequately advocate what she is feeling for the first time.  Patient also appeared to be in less fear of advocating for herself today. Visit Diagnosis:    ICD-10-CM   1. Schizoaffective disorder, depressive type ([place])  F25.1 FLUoxetine (PROZAC) 20 MG capsule    2. Mixed obsessional thoughts and acts  F42.2 FLUoxetine (PROZAC) 20 MG capsule      Past Psychiatric History: Inpatient: [facility]/2020, diagnosed OCD discharged on Zyprexa 5 mg nightly, Zoloft 50 mg daily, trazodone 50 mg nightly Outpatient: Dr. Tammy Sours saw 3 weeks ago, Risperdal Consta 50 mg q. 14 days, Prozac 20 mg, Cogentin 0.5 mg-diagnosed with schizophrenia and OCD No other prior medications known Patient has history of poor compliance hence Risperdal Consta   Last visit: 07/24/2022: Patient had received her Risperdal Consta injection the day prior however she was presenting with TD symptoms.  Recommended discontinuing this medication, also discontinued previously provided Cogentin 0.5 mg.  Continue patient's Prozac 20 mg.  Messaged patient's PCP regarding possible MRI head.   08/08/2022: Patient was started on Abilify 10 mg and continuing her Prozac 20 mg.  Patient appeared to have brighter affect, and decrease in parkinsonism symptoms.   09/2022: Patient started on Abilify Maintena, with 14-day taper as patient was no longer endorsing drug-induced parkinsonism features.  Patient Prozac was continued.  Past Medical History:  Past Medical History:  Diagnosis Date   Anxiety    Depression    Obsessive-compulsive disorder    No past surgical history on file.  Family Psychiatric History: Per mother, patient's father has history of undiagnosed psychiatric disorder but had frequent paranoid episode started in his early 21s and continues to now.    Family History: No family history on file.  Social History:  Social History   Socioeconomic History   Marital status: Single    Spouse name: Not  on file   Number of children: 0   Years of education: Not on file   Highest education level: Not on file  Occupational History   Occupation: student    Comment: [employer]  Tobacco Use   Smoking status: Never   Smokeless tobacco: Never  Vaping Use   Vaping Use: Never used  Substance and Sexual Activity   Alcohol use: Yes    Comment: occassional   Drug use: No   Sexual activity: Never  Other Topics Concern   Not on file  Social History Narrative   Not on file   Social Determinants of Health   Financial Resource Strain: Low Risk  (07/24/2022)   Overall Financial Resource Strain (CARDIA)    Difficulty of Paying Living Expenses: Not hard at all  Food Insecurity: No Food Insecurity (07/24/2022)   Hunger Vital Sign    Worried About Running Out of Food in the Last Year: Never true    Ran Out of Food in the Last Year: Never true  Transportation Needs: No Transportation Needs (07/24/2022)   PRAPARE - Hydrologist (Medical): No  Lack of Transportation (Non-Medical): No  Physical Activity: Inactive (07/24/2022)   Exercise Vital Sign    Days of Exercise per Week: 0 days    Minutes of Exercise per Session: 0 min  Stress: Stress Concern Present (07/24/2022)   [place]    Feeling of Stress : To some extent  Social Connections: Socially Isolated (07/24/2022)   Social Connection and Isolation Panel [NHANES]    Frequency of Communication with Friends and Family: More than three times a week    Frequency of Social Gatherings with Friends and Family: Not on file    Attends Religious Services: Never    Marine scientist or Organizations: No    Attends Music therapist: Never    Marital Status: Never married    Allergies: No Known Allergies  Metabolic Disorder Labs: No results found for: "HGBA1C", "MPG" No results found for: "PROLACTIN" No results found for: "CHOL",  "TRIG", "HDL", "CHOLHDL", "VLDL", "LDLCALC" Lab Results  Component Value Date   TSH 10.615 (H) 05/02/2020    Therapeutic Level Labs: No results found for: "LITHIUM" No results found for: "VALPROATE" No results found for: "CBMZ"  Current Medications: Current Outpatient Medications  Medication Sig Dispense Refill   ARIPiprazole (ABILIFY) 10 MG tablet Take 1 tablet (10 mg total) by mouth daily. 14 tablet 0   ARIPiprazole ER (ABILIFY MAINTENA) 400 MG PRSY prefilled syringe Inject 400 mg into the muscle every 28 (twenty-eight) days. 1 each 12   FLUoxetine (PROZAC) 20 MG capsule Take 2 capsules (40 mg total) by mouth daily. 30 capsule 3   hydrOXYzine (ATARAX) 25 MG tablet Take 1 tablet (25 mg total) by mouth at bedtime as needed. 30 tablet 3   No current facility-administered medications for this visit.     Musculoskeletal: Strength & Muscle Tone: within normal limits Gait & Station: normal Patient leans: N/A  Psychiatric Specialty Exam: Review of Systems  Psychiatric/Behavioral:  Positive for hallucinations. Negative for sleep disturbance and suicidal ideas.     Blood pressure 105/61, pulse 76, resp. rate 20, weight 161 lb (73 kg), SpO2 100 %.Body mass index is 27.64 kg/m.  General Appearance: Casual  Eye Contact:  Good  Speech:  Clear and Coherent less blunt than past episodes  Volume:  Normal  Mood:  Euthymic  Affect:  Flat  Thought Process:  Goal Directed  Orientation:  Full (Time, Place, and Person)  Thought Content: Obsessions, Rumination, and still some thought blocking    Suicidal Thoughts:  No  Homicidal Thoughts:  No  Memory:  Immediate;   Fair Recent;   Fair patient appeared more confident in her memory than her mother  Judgement:  Other:  Improving  Insight:  Fair  Psychomotor Activity:  Normal  Concentration:  Concentration: Fair  Recall:  NA  Fund of Knowledge: Good  Language: Good  Akathisia:  No  Handed:    AIMS (if indicated): not done  Assets:   Communication Skills Desire for Improvement Housing Leisure Time Resilience Social Support  ADL's:  Intact  Cognition: WNL  Sleep:  Good   Screenings: AIMS    Flowsheet Row Clinical Support from 09/25/2022 in [place] from 08/08/2022 in [facility] Office Visit from 07/24/2022 in [facility] Admission (Discharged) from 05/03/2020 in [place] [facility]  AIMS Total Score 0 1 2 0      AUDIT  Flowsheet Row Admission (Discharged) from 05/03/2020 in [place] [facility]  Alcohol Use Disorder Identification Test Final Score (AUDIT) 0      PHQ2-9    Flowsheet Row Office Visit from 07/24/2022 in [facility] ED from 11/08/2020 in [facility] ED from 05/02/2020 in [facility]  PHQ-2 Total Score 4 2 3   PHQ-9 Total Score 13 12 15       [place] ED from 11/08/2020 in [facility] Admission (Discharged) from 05/03/2020 in [place] [facility]  C-SSRS RISK CATEGORY High Risk No Risk        Assessment and Plan:  Ayannah Faddis is a 22 year old, [place]-American, patient with a PPH of schizophrenia and OCD.   Patient compliance is improving, and patient appeared to be more on board with being a part of her care.  Overall patient appeared to have less cognitive slowing today and was better able to advocate for herself what is been going on.  Despite patient subjectively feeling as though nothing has changed, objectively both provider and mom noticed patient able to communicate her thoughts better.  Patient has also been talking to her mom about returning to school, which is something that patient enjoys doing.  We will continue with Abilify as patient herself later endorses having decreased AVH despite  continued AH.  Patient also appeared to feel more in control today, endorsing that she is going to continue to work to now allow the obsessive thoughts to control her.  Schizophrenia, unspecified (R/O schizoaffective disorder, depressive type) OCD - Increase Prozac to 40 mg daily -Continue Abilify Maintena 400 mg, patient will remain in shot clinic and continue to see this provider    Collaboration of Care: Collaboration of Care:   Patient/Guardian was advised Release of Information must be obtained prior to any record release in order to collaborate their care with an outside provider. Patient/Guardian was advised if they have not already done so to contact the registration department to sign all necessary forms in order for [place] to release information regarding their care.   Consent: Patient/Guardian gives verbal consent for treatment and assignment of benefits for services provided during this visit. Patient/Guardian expressed understanding and agreed to proceed.   PGY-3 Freida Busman, MD 10/23/2022, 5:45 PM

## 2022-10-24 ENCOUNTER — Ambulatory Visit (HOSPITAL_COMMUNITY): Payer: Federal, State, Local not specified - Other

## 2022-10-24 ENCOUNTER — Encounter (HOSPITAL_COMMUNITY): Payer: Self-pay

## 2022-10-24 VITALS — BP 104/65 | HR 67 | Resp 16 | Ht 61.81 in | Wt 160.6 lb

## 2022-10-24 DIAGNOSIS — F251 Schizoaffective disorder, depressive type: Secondary | ICD-10-CM

## 2022-10-24 MED ORDER — ARIPIPRAZOLE ER 400 MG IM PRSY
400.0000 mg | PREFILLED_SYRINGE | Freq: Once | INTRAMUSCULAR | Status: AC
  Administered 2022-10-24: 400 mg via INTRAMUSCULAR

## 2022-10-24 NOTE — Progress Notes (Signed)
Patient arrived for Abilify 400 mg  Injection . Tolerated Injection Well in Left Arm Pleasant not Talkative. But smiles & will Speak when necessary . NO HI/SI  NOR AH/VH

## 2022-10-26 ENCOUNTER — Other Ambulatory Visit (HOSPITAL_COMMUNITY): Payer: Self-pay | Admitting: Student in an Organized Health Care Education/Training Program

## 2022-10-26 DIAGNOSIS — F250 Schizoaffective disorder, bipolar type: Secondary | ICD-10-CM

## 2022-10-26 MED ORDER — ABILIFY MAINTENA 400 MG IM PRSY: 400.0000 mg | PREFILLED_SYRINGE | INTRAMUSCULAR | 12 refills | Status: DC

## 2022-10-26 NOTE — Progress Notes (Signed)
PA completed for patient's Abilify maintena 400mg  on 10/26/2022.   PA number [id number]

## 2022-11-21 ENCOUNTER — Ambulatory Visit (INDEPENDENT_AMBULATORY_CARE_PROVIDER_SITE_OTHER): Payer: No Payment, Other

## 2022-11-21 ENCOUNTER — Encounter (HOSPITAL_COMMUNITY): Payer: Self-pay

## 2022-11-21 VITALS — BP 105/67 | HR 64 | Temp 97.7°F | Resp 16 | Ht 61.0 in | Wt 161.4 lb

## 2022-11-21 DIAGNOSIS — F250 Schizoaffective disorder, bipolar type: Secondary | ICD-10-CM

## 2022-11-21 MED ORDER — ARIPIPRAZOLE ER 400 MG IM PRSY
400.0000 mg | PREFILLED_SYRINGE | Freq: Once | INTRAMUSCULAR | Status: AC
  Administered 2022-11-21: 400 mg via INTRAMUSCULAR

## 2022-11-21 NOTE — Progress Notes (Signed)
Patient arrived for injection of Abilify Maintena 454m. Given in Right Deltoid without any issues. Does states that her arm gets sore after her injections. Patient states she will try taking ibuprofen when she gets home. No SI/HI or AV hallucinations. No other side effects noted. Pleasant and cooperative. Did not bring her medication with her, states that she does not have a job or Medicaid yet. She is going to apply soon. Mother filled out form for patient assistance.

## 2022-12-19 ENCOUNTER — Encounter (HOSPITAL_COMMUNITY): Payer: Self-pay

## 2022-12-19 ENCOUNTER — Ambulatory Visit (INDEPENDENT_AMBULATORY_CARE_PROVIDER_SITE_OTHER): Payer: No Payment, Other

## 2022-12-19 VITALS — BP 99/67 | HR 80 | Ht 61.0 in | Wt 164.4 lb

## 2022-12-19 DIAGNOSIS — F250 Schizoaffective disorder, bipolar type: Secondary | ICD-10-CM

## 2022-12-19 MED ORDER — ARIPIPRAZOLE ER 400 MG IM PRSY
400.0000 mg | PREFILLED_SYRINGE | Freq: Once | INTRAMUSCULAR | Status: AC
  Administered 2022-12-19: 400 mg via INTRAMUSCULAR

## 2022-12-19 NOTE — Progress Notes (Signed)
Patient arrived for injection of Abilify Maintena '400mg'$  given in Left Deltoid. Patient was told that she will need to pick up her medication at her [place] before her next injection to bring the medication with her. She verbalized understanding. No issues or complaints.

## 2022-12-20 ENCOUNTER — Encounter (HOSPITAL_COMMUNITY)
Payer: Federal, State, Local not specified - Other | Admitting: Student in an Organized Health Care Education/Training Program

## 2023-01-02 ENCOUNTER — Ambulatory Visit (INDEPENDENT_AMBULATORY_CARE_PROVIDER_SITE_OTHER): Payer: No Payment, Other | Admitting: Student in an Organized Health Care Education/Training Program

## 2023-01-02 VITALS — BP 109/56 | HR 73 | Resp 20 | Wt 166.0 lb

## 2023-01-02 DIAGNOSIS — F203 Undifferentiated schizophrenia: Secondary | ICD-10-CM | POA: Diagnosis not present

## 2023-01-02 DIAGNOSIS — F422 Mixed obsessional thoughts and acts: Secondary | ICD-10-CM | POA: Diagnosis not present

## 2023-01-02 MED ORDER — FLUOXETINE HCL 40 MG PO CAPS: 40.0000 mg | ORAL_CAPSULE | Freq: Every day | ORAL | 2 refills | Status: DC

## 2023-01-02 MED ORDER — FLUOXETINE HCL 10 MG PO CAPS: ORAL_CAPSULE | ORAL | 2 refills | Status: DC

## 2023-01-02 NOTE — Progress Notes (Signed)
BH MD/PA/NP OP Progress Note  01/02/2023 5:10 PM Eileen Rivas  MRN:  [id number]  Chief Complaint:  Chief Complaint  Patient presents with   Follow-up   HPI:  Patient reports she was compliant with meds, but has not had in 19mo b/c mom did not understand how refills worked.  Patient reports that her mood has been "good." She does feel like she has been more irritable lately. Mom endorses that patient appears easily overwhelmed.    Patient reports that she feels overall she is doing "the same." Patient reports that she see's the white flash, and endorses this is VH. Patient reports that when she has [place], but has only seen it once the last  121mo Patient reports that she is having intrusive thoughts, but no AH. Patient reports that the intrusive thoughts are no longer telling her to harm things, and she is less scared of them, because she knows she has control. Patient reports that she will now have thoughts that she will lose a limb if she washes her hands wrong after touching the "green" bin and she might get a bacterial infection. Patient reports tht she spends a lot of time washing herself, but she is also not washing dishes because she is afraid to miss dirt. Patient reports that she feels like she needs to be re taught how to wash dishes, "like a normal person" because she will spend too much time washing until her wrist hurts.   Patient reports she is still having "blasphemous" obsessive thoughts that keep her from doing what she wants, like sending messages or saying certain things. Patient reports that the thoughts lead to her ruminating that if she were to do certain things that it will be an dact against God and something bad will happen.   Patient reports her thoughts make her scared that she or someone else will suffer from things not being clean enough.   Mom reports that patient is also using a lot of toilet tissue paper.  Patient denies SI and HI.   Patient  reports that she is still sleeping a lot. [facility]. Mom reports she will reach out again. Patient reports she is interested in learning Latin dance. [facility] is [facility]. Mom reports that she does not want to pay for a class and patient not be able to attend.  Visit Diagnosis:    ICD-10-CM   1. Mixed obsessional thoughts and acts  F42.2 FLUoxetine (PROZAC) 10 MG capsule    FLUoxetine (PROZAC) 40 MG capsule    2. 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 StTammy Soursaw 3 weeks ago, Risperdal Consta 50 mg q. 14 days, Prozac 20 mg, Cogentin 0.5 mg-diagnosed with schizophrenia and OCD No other prior medications known Patient has history of poor compliance hence Risperdal Consta   Last visit: 07/24/2022: Patient had received her Risperdal Consta injection the day prior however she was presenting with TD symptoms.  Recommended discontinuing this medication, also discontinued previously provided Cogentin 0.5 mg.  Continue patient's Prozac 20  mg.  Messaged patient's PCP regarding possible MRI head.   08/08/2022: Patient was started on Abilify 10 mg and continuing her Prozac 20 mg.  Patient appeared to have brighter affect, and decrease in parkinsonism symptoms.   09/2022: Patient started on Abilify Maintena, with 14-day taper as patient was no longer endorsing drug-induced parkinsonism features.  Patient Prozac was continued.  10/2022- OCD continues to be a problem, lots of showering time, brushing teeth until gums bled,  intrusive thoughts telling her that if she did something it would kill the family dog. Prozac increase to '40mg'$ , Abilify Maintena '400mg'$  continued Past Medical History:  Past Medical History:  Diagnosis Date   Anxiety    Depression    Obsessive-compulsive disorder    No past surgical history on file.  Family Psychiatric History: Per mother, patient's father has history of undiagnosed psychiatric disorder but had frequent paranoid episode started in his early 79s and continues to now.   Family History: No family history on file.  Social History:  Social History   Socioeconomic History   Marital status: Single    Spouse name: Not on file   Number of children: 0   Years of education: Not on file   Highest education level: Not on file  Occupational History   Occupation: student    Comment: [employer]  Tobacco Use   Smoking status: Never   Smokeless tobacco: Never  Vaping Use   Vaping Use: Never used  Substance and Sexual Activity   Alcohol use: Yes    Comment: occassional   Drug use: No   Sexual activity: Never  Other Topics Concern   Not on file  Social History Narrative   Not on file   Social Determinants of Health   Financial Resource Strain: Low Risk  (07/24/2022)   Overall Financial Resource Strain (CARDIA)    Difficulty of Paying Living Expenses: Not hard at all  Food Insecurity: No Food Insecurity (07/24/2022)   Hunger Vital Sign    Worried About Running Out of Food in the Last Year: Never true    Ran Out of Food in the Last Year: Never true  Transportation Needs: No Transportation Needs (07/24/2022)   PRAPARE - Hydrologist (Medical): No    Lack of Transportation (Non-Medical): No  Physical Activity: Inactive (07/24/2022)   Exercise Vital Sign    Days of Exercise per Week: 0 days    Minutes of Exercise per Session: 0 min  Stress: Stress Concern Present (07/24/2022)   [place]    Feeling of Stress : To some extent  Social Connections: Socially Isolated (07/24/2022)   Social Connection and Isolation Panel [NHANES]    Frequency of Communication with Friends and Family: More than three times a week    Frequency of Social Gatherings with Friends and Family: Not on file    Attends Religious Services: Never    Marine scientist or Organizations: No    Attends Music therapist: Never    Marital Status: Never married    Allergies: No Known Allergies  Metabolic Disorder Labs: No results found for: "HGBA1C", "MPG" No results found for: "PROLACTIN" No results found for: "CHOL", "TRIG", "HDL", "CHOLHDL", "VLDL", "LDLCALC" Lab Results  Component Value Date   TSH 10.615 (H) 05/02/2020    Therapeutic Level Labs: No results found for: "LITHIUM" No results found for: "VALPROATE" No results found for: "CBMZ"  Current Medications: Current  Outpatient Medications  Medication Sig Dispense Refill   FLUoxetine (PROZAC) 10 MG capsule Take 1 capsule (10 mg total) by mouth daily for 7 days, THEN 2 capsules (20 mg total) daily for 7 days, THEN 3 capsules (30 mg total) daily for 7 days. 30 capsule 2   [START ON 01/22/2023] FLUoxetine (PROZAC) 40 MG capsule Take 1 capsule (40 mg total) by mouth daily. 30 capsule 2   ARIPiprazole (ABILIFY) 10 MG tablet Take 1 tablet (10 mg total) by mouth daily. 14 tablet 0   ARIPiprazole ER (ABILIFY MAINTENA) 400 MG PRSY prefilled syringe Inject 400 mg into the muscle every 28 (twenty-eight) days. 1 each 12   hydrOXYzine (ATARAX) 25 MG tablet Take 1 tablet (25 mg total) by mouth at bedtime as needed. 30 tablet 3   No current facility-administered medications for this visit.     Musculoskeletal: Strength & Muscle Tone: within normal limits Gait & Station: normal Patient leans: N/A  Psychiatric Specialty Exam: Review of Systems  Blood pressure (!) 109/56, pulse 73, resp. rate 20, weight 166 lb (75.3 kg),  SpO2 100 %.Body mass index is 31.37 kg/m.  General Appearance: Casual  Eye Contact:  Good  Speech:  Clear and Coherent  Volume:  Normal monotone  Mood:  Euthymic  Affect:  Restricted can smile on occasion  Thought Process:  Goal Directed  Orientation:  Full (Time, Place, and Person)  Thought Content: Obsessions   Suicidal Thoughts:  No  Homicidal Thoughts:  No  Memory:  Immediate;   Good Recent;   Fair  Judgement:  Impaired  Insight:  Shallow  Psychomotor Activity:  Psychomotor Retardation  Concentration:  Concentration: Fair  Recall:  NA  Fund of Knowledge: Good  Language: Good  Akathisia:  No  Handed:    AIMS (if indicated): not done  Assets:  Communication Skills Desire for Improvement Housing Leisure Time Resilience  ADL's:  Intact  Cognition: Not formally assessed  Sleep:  Good   Screenings: AIMS    Flowsheet Row Clinical Support from 09/25/2022 in [place] from 08/08/2022 in [facility] Office Visit from 07/24/2022 in [facility] Admission (Discharged) from 05/03/2020 in [place] [facility]  AIMS Total Score 0 1 2 0      AUDIT    Flowsheet Row Admission (Discharged) from 05/03/2020 in [place] [facility]  Alcohol Use Disorder Identification Test Final Score (AUDIT) 0      PHQ2-9    [place] Office Visit from 07/24/2022 in [facility] ED from 11/08/2020 in [facility] ED from 05/02/2020 in [facility]  PHQ-2 Total Score '4 2 3  '$ PHQ-9 Total Score '13 12 15      '$ [place] ED from 11/08/2020 in [facility] Admission (Discharged) from 05/03/2020 in [place] [facility]  C-SSRS RISK CATEGORY High Risk No Risk        Assessment and Plan:   Patient OCD appears  to be more of a concern at this time. She may have benefited from the Prozac, but has been without for at last 1 month. Will have to restart titration. Patient is slowly becoming more comfortable telling provider what the obsessive thoughts are during session, but it does require coaxing. Patient endorses still feeling scared, but still trying use some logic with certain thoughts almost as if to reality  test. Patient would probably do well in exposure therapy. Patient's family appears to really struggle with understanding patient's dx and there does appear to be low health literacy amongst family members, which is contributing to poor medication compliance. Have provided a OCD- contamination specific handout in Spanish for the family. Also took patient and mom to see Medicaid SW. Patient may also benefit from Vit D supplement. Interestingly memory appeared to be less of a concern today, in fact it appeared to be improved.    OCD Schizophrenia, unspecified - Restart Prozac  '10mg'$  x 7 days followed by '20mg'$  x 7 days followed by  '30mg'$  x 7 days followed by   -Discuss at next visit Vit D needs and if patient has PCP, she need and EKG - AIMS at next visit w/ provider   F/u in approx 1 mon  Collaboration of Care: Collaboration of Care:   Patient/Guardian was advised Release of Information must be obtained prior to any record release in order to collaborate their care with an outside provider. Patient/Guardian was advised if they have not already done so to contact the registration department to sign all necessary forms in order for [place] to release information regarding their care.   Consent: Patient/Guardian gives verbal consent for treatment and assignment of benefits for services provided during this visit. Patient/Guardian expressed understanding and agreed to proceed.   PGY-3 Freida Busman, MD 01/02/2023, 5:10 PM

## 2023-01-02 NOTE — Patient Instructions (Signed)
  Medication Continue monthly Abilify Maintena shot Start Prozac 10mg   for 7 days followed by: Prozac 20mg  for 7 days followed by Prozac 30mg  for 7 days followed by Prozac 40mg  until next visit

## 2023-01-07 ENCOUNTER — Encounter (HOSPITAL_COMMUNITY): Payer: Self-pay | Admitting: Student in an Organized Health Care Education/Training Program

## 2023-02-15 ENCOUNTER — Ambulatory Visit (INDEPENDENT_AMBULATORY_CARE_PROVIDER_SITE_OTHER): Payer: No Payment, Other | Admitting: Student in an Organized Health Care Education/Training Program

## 2023-02-15 ENCOUNTER — Other Ambulatory Visit (HOSPITAL_COMMUNITY): Payer: Self-pay

## 2023-02-15 DIAGNOSIS — F250 Schizoaffective disorder, bipolar type: Secondary | ICD-10-CM

## 2023-02-15 DIAGNOSIS — F422 Mixed obsessional thoughts and acts: Secondary | ICD-10-CM | POA: Diagnosis not present

## 2023-02-15 MED ORDER — FLUOXETINE HCL 40 MG PO CAPS
40.0000 mg | ORAL_CAPSULE | Freq: Every day | ORAL | 2 refills | Status: DC
  Filled 2023-02-15: qty 30, 30d supply, fill #0

## 2023-02-15 MED ORDER — FLUOXETINE HCL 20 MG PO CAPS
20.0000 mg | ORAL_CAPSULE | Freq: Every day | ORAL | 0 refills | Status: DC
  Filled 2023-02-15: qty 7, 7d supply, fill #0

## 2023-02-15 MED ORDER — ARIPIPRAZOLE 10 MG PO TABS
10.0000 mg | ORAL_TABLET | Freq: Every day | ORAL | 0 refills | Status: DC
  Filled 2023-02-15: qty 14, 14d supply, fill #0

## 2023-02-15 MED ORDER — ABILIFY MAINTENA 400 MG IM PRSY
400.0000 mg | PREFILLED_SYRINGE | INTRAMUSCULAR | 12 refills | Status: DC
  Filled 2023-02-15: qty 1, 28d supply, fill #0

## 2023-02-15 NOTE — Progress Notes (Signed)
BH MD/PA/NP OP Progress Note  02/15/2023 2:46 PM Laurenashley Viar  MRN:  [id number]  Chief Complaint:  Chief Complaint  Patient presents with   Follow-up   HPI: Eileen Rivas is a 22 year old, [place]-American, patient with a PPH of schizophrenia and OCD.  She presents alone today.  Patient does endorse that she is worried that she will forget a lot of the conversation.  Patient does ask if one of her friends that she has met online could listen however, provider advised against this since that patient has never met this individual in the flash.  Patient endorses that she is taking Prozac 20mg  daily, she is doing her best to take it everyday. She has missed her Abilify shots.  She reports that she has been out of Prozac for about 1 week.  She endorses that within the last week she has noticed her mood significantly declined and she feels that she is depressed. Patient reports that she is hearing intrusive thoughts tell her she will give the devil "something." She reports that because of this she will brush her teeth for about an hour and her depression has been coming back. She reports very low motivation to do hygiene things, also she knows she takes a long time doing these things. She reports that she feels like she can't do anything. Patient reports that she does find some joy in talking to her friend. She denies SI and HI. She reports that she feel like there is a voice in her head, that mimics her voice and it has the intrusive statements. She reports that she has seen the white light once in the last month, and notes that it does tend to occur  in the early evening.   Patient reports that she is sleeping about 5hrs wakes up in the mid AM, and then may go back to sleep. Patient reports that she has not been doing anything lately, and has not heard from [facility].   Patient reports that she has SSI, but does not know if she has Medicaid.  Patient reports that she feels like her  memory is getting really poor. Patient reports that she is worried about is something is blasphemy.   She reports that her appetite is good, she thinks she may be a eating a little more than normal.   Visit Diagnosis:    ICD-10-CM   1. Schizoaffective disorder, bipolar type (HCC)  F25.0 ARIPiprazole ER (ABILIFY MAINTENA) 400 MG PRSY prefilled syringe    ARIPiprazole (ABILIFY) 10 MG tablet    Prolactin    Comprehensive Metabolic Panel (CMET)    CBC with Differential    HgB A1c    Lipid Profile    2. Mixed obsessional thoughts and acts  F42.2 ARIPiprazole (ABILIFY) 10 MG tablet    FLUoxetine (PROZAC) 20 MG capsule    FLUoxetine (PROZAC) 40 MG capsule      Past Psychiatric History: Mom brought labs from 06/28/2022- Pertinent labs include: TSH 3.98 Cholesterol: Total 210/ HDL 42/triglycerides 196/LDL 134 Prolactin 131.9 Vitamin D: 12 B12: 570 WBC 4.33/Hgb 12.4/MCV 85.5/platelets 347 Inpatient: [facility] H-04/2020, diagnosed OCD discharged on Zyprexa 5 mg nightly, Zoloft 50 mg daily, trazodone 50 mg nightly Outpatient: Dr. Otto Herb saw 3 weeks ago, Risperdal Consta 50 mg q. 14 days, Prozac 20 mg, Cogentin 0.5 mg-diagnosed with schizophrenia and OCD No other prior medications known Patient has history of poor compliance hence Risperdal Consta   Last visit: 07/24/2022: Patient had received her Risperdal Consta injection the  day prior however she was presenting with TD symptoms.  Recommended discontinuing this medication, also discontinued previously provided Cogentin 0.5 mg.  Continue patient's Prozac 20 mg.  Messaged patient's PCP regarding possible MRI head.   08/08/2022: Patient was started on Abilify 10 mg and continuing her Prozac 20 mg.  Patient appeared to have brighter affect, and decrease in parkinsonism symptoms.   09/2022: Patient started on Abilify Maintena, with 14-day taper as patient was no longer endorsing drug-induced parkinsonism features.  Patient Prozac was continued.    10/2022- OCD continues to be a problem, lots of showering time, brushing teeth until gums bled, intrusive thoughts telling her that if she did something it would kill the family dog. Prozac increase to 40mg , Abilify Maintena 400mg  continued  12/2022-patient had been without her Prozac for approximately 1 month but endorses that she had seen some improvements in her mood when she had been on the medication.  I did restart patient's Prozac titration.Patient's family appears to really struggle with understanding patient's dx and there does appear to be low health literacy amongst family members, which is contributing to poor medication compliance. Becoming more comfortable telling provider what the obsessive thoughts are during session, but it does require coaxing. Patient endorses still feeling scared, but still trying use some logic with certain thoughts almost as if to reality test.   Past Medical History:  Past Medical History:  Diagnosis Date   Anxiety    Depression    Obsessive-compulsive disorder    No past surgical history on file.  Family Psychiatric History: Per mother, patient's father has history of undiagnosed psychiatric disorder but had frequent paranoid episode started in his early 85s and continues to now.   Family History: No family history on file.  Social History:  Social History   Socioeconomic History   Marital status: Single    Spouse name: Not on file   Number of children: 0   Years of education: Not on file   Highest education level: Not on file  Occupational History   Occupation: student    Comment: [employer]  Tobacco Use   Smoking status: Never   Smokeless tobacco: Never  Vaping Use   Vaping Use: Never used  Substance and Sexual Activity   Alcohol use: Yes    Comment: occassional   Drug use: No   Sexual activity: Never  Other Topics Concern   Not on file  Social History Narrative   Not on file   Social Determinants of Health   Financial  Resource Strain: Low Risk  (07/24/2022)   Overall Financial Resource Strain (CARDIA)    Difficulty of Paying Living Expenses: Not hard at all  Food Insecurity: No Food Insecurity (07/24/2022)   Hunger Vital Sign    Worried About Running Out of Food in the Last Year: Never true    Ran Out of Food in the Last Year: Never true  Transportation Needs: No Transportation Needs (07/24/2022)   PRAPARE - Administrator, Civil Service (Medical): No    Lack of Transportation (Non-Medical): No  Physical Activity: Inactive (07/24/2022)   Exercise Vital Sign    Days of Exercise per Week: 0 days    Minutes of Exercise per Session: 0 min  Stress: Stress Concern Present (07/24/2022)   [employer] of Occupational Health - Occupational Stress Questionnaire    Feeling of Stress : To some extent  Social Connections: Socially Isolated (07/24/2022)   Social Connection and Isolation Panel [NHANES]  Frequency of Communication with Friends and Family: More than three times a week    Frequency of Social Gatherings with Friends and Family: Not on file    Attends Religious Services: Never    Database administrator or Organizations: No    Attends Engineer, structural: Never    Marital Status: Never married    Allergies: No Known Allergies  Metabolic Disorder Labs: No results found for: "HGBA1C", "MPG" No results found for: "PROLACTIN" No results found for: "CHOL", "TRIG", "HDL", "CHOLHDL", "VLDL", "LDLCALC" Lab Results  Component Value Date   TSH 10.615 (H) 05/02/2020    Therapeutic Level Labs: No results found for: "LITHIUM" No results found for: "VALPROATE" No results found for: "CBMZ"  Current Medications: Current Outpatient Medications  Medication Sig Dispense Refill   FLUoxetine (PROZAC) 20 MG capsule Take 1 capsule (20 mg total) by mouth daily for 7 days. 7 capsule 0   [START ON 02/22/2023] FLUoxetine (PROZAC) 40 MG capsule Take 1 capsule (40 mg total) by mouth daily. Start  after completing 7 days on Prozac 20mg  30 capsule 2   ARIPiprazole (ABILIFY) 10 MG tablet Take 1 tablet (10 mg total) by mouth daily. 14 tablet 0   ARIPiprazole ER (ABILIFY MAINTENA) 400 MG PRSY prefilled syringe Inject 400 mg into the muscle every 28 (twenty-eight) days. 1 each 12   hydrOXYzine (ATARAX) 25 MG tablet Take 1 tablet (25 mg total) by mouth at bedtime as needed. 30 tablet 3   No current facility-administered medications for this visit.     Musculoskeletal: Strength & Muscle Tone: within normal limits Gait & Station: normal Patient leans: N/A  Psychiatric Specialty Exam: Review of Systems  Psychiatric/Behavioral:  Positive for dysphoric mood and hallucinations. Negative for sleep disturbance and suicidal ideas. The patient is nervous/anxious.     Blood pressure 104/62, pulse 74, resp. rate 16, weight 165 lb (74.8 kg), SpO2 99 %.Body mass index is 31.18 kg/m.  General Appearance: Casual  Eye Contact:  Fair  Speech:  Clear and Coherent and Slow  Volume:  Normal  Mood:  Dysphoric  Affect:  Restricted  Thought Process:  Goal Directed  Orientation:  Full (Time, Place, and Person)  Thought Content: Delusions and Rumination   Suicidal Thoughts:  No  Homicidal Thoughts:  No  Memory:  Immediate;   Fair Recent;   Poor  Judgement:  Fair  Insight:  Shallow  Psychomotor Activity:  Decreased  Concentration:  Concentration: Fair  Recall:  NA  Fund of Knowledge: Poor  Language: Good  Akathisia:  NA  Handed:    AIMS (if indicated): not done  Assets:  Communication Skills Desire for Improvement Housing Leisure Time Resilience Social Support  ADL's:  Intact  Cognition: Impaired, not formally assessed  Sleep:  Good   Screenings: AIMS    Flowsheet Row Clinical Support from 09/25/2022 in [facility] Clinical Support from 08/08/2022 in [facility] Office Visit from 07[facility]24/2022 in [facility]) [facility]  AIMS Total Score 0 1 2 0      AUDIT    Flowsheet Row [facility]  Alcohol Use Disorder Identification Test Final Score (AUDIT) 0      PHQ2-9    Flowsheet Row Office Visit from 07/24/2022 in [facility] ED from 11/08/2020 in [facility] ED from  05/02/2020 in [facility]  PHQ-2 Total Score 4 2 3   PHQ-9 Total Score 13 12 15       Flowsheet Row ED from 11/08/2020 in [facility] Admission (Discharged) [facility]  C-SSRS RISK CATEGORY High Risk No Risk        Assessment and Plan: Unfortunately patient continues to struggle with compliance and has now gone 2 months without her Abilify in 1 week without Prozac.  Patient was only able to get up to 20 mg of Prozac.  She is now endorsing decompensation in her mood with more depressed mood as well as increase intrusive thoughts and ruminative behaviors as well as compulsions.  Patient did present alone today for the first time, and was able to express herself fairly well however, patient also has anxiety about not being able to remember what she wants to say and has some awareness that her memory is very poor.  Due to patient's issues with compliance with proven benefit of being able to tolerate medications and improvement on the medications we will restart patient on a faster titration upward of Prozac.  We will start patient on 20 mg then increase to 40 mg after 1 week.  We will also restart patient on Abilify Maintena, will have to restart 14-day p.o. administration however, makers of the medication endorsed that when restarting, the Abilify IM can be given during the 14-day p.o. administration.  Patient continues to  struggle with social barriers she cannot afford the Abilify and is unsure what is going on with her Medicaid.  Did provide patient Orange card application of both [place] and [place] and asked that she go through with her mother and sister.  We will also be applying for medication assistance for Abilify and have transition all of patient's medications to the community pharmacy.  Also reeducated patient on the importance of picking up her refills. OCD Schizophrenia, unspecified  - Restart Prozac 20mg  daily x 7 days then increase to 40mg  daily - restart Abilify Maintenna 400mg  IM and 14 days of Abilify 10mg  daily - Lab order for the future- Prolactin, lipids, A1c, CBC, CMP  Patient has [facility] clinic scheduled for 02/21/2023, have also placed labs.  Collaboration of Care: Collaboration of Care:   Patient/Guardian was advised Release of Information must be obtained prior to any record release in order to collaborate their care with an outside provider. Patient/Guardian was advised if they have not already done so to contact the registration department to sign all necessary forms in order for [place] to release information regarding their care.   Consent: Patient/Guardian gives verbal consent for treatment and assignment of benefits for services provided during this visit. Patient/Guardian expressed understanding and agreed to proceed.   PGY-3 Bobbye Morton, MD 02/15/2023, 2:46 PM

## 2023-02-18 ENCOUNTER — Other Ambulatory Visit: Payer: Self-pay

## 2023-02-21 ENCOUNTER — Ambulatory Visit (HOSPITAL_COMMUNITY): Payer: No Payment, Other

## 2023-02-21 ENCOUNTER — Telehealth (HOSPITAL_COMMUNITY): Payer: Self-pay | Admitting: *Deleted

## 2023-02-21 ENCOUNTER — Other Ambulatory Visit (HOSPITAL_COMMUNITY): Payer: Self-pay

## 2023-02-21 ENCOUNTER — Other Ambulatory Visit (HOSPITAL_COMMUNITY): Payer: Self-pay | Admitting: Psychiatry

## 2023-02-21 ENCOUNTER — Telehealth (HOSPITAL_COMMUNITY): Payer: Self-pay

## 2023-02-21 DIAGNOSIS — F422 Mixed obsessional thoughts and acts: Secondary | ICD-10-CM

## 2023-02-21 DIAGNOSIS — F250 Schizoaffective disorder, bipolar type: Secondary | ICD-10-CM

## 2023-02-21 MED ORDER — FLUOXETINE HCL 20 MG PO CAPS
40.0000 mg | ORAL_CAPSULE | Freq: Every day | ORAL | 3 refills | Status: DC
  Filled 2023-02-21 – 2023-03-26 (×2): qty 60, 30d supply, fill #0

## 2023-02-21 MED ORDER — HYDROXYZINE HCL 25 MG PO TABS
25.0000 mg | ORAL_TABLET | Freq: Every evening | ORAL | 3 refills | Status: DC | PRN
Start: 2023-02-21 — End: 2023-06-07
  Filled 2023-02-21: qty 30, 30d supply, fill #0
  Filled 2023-03-26: qty 30, 30d supply, fill #1

## 2023-02-21 MED ORDER — ABILIFY MAINTENA 400 MG IM PRSY
400.0000 mg | PREFILLED_SYRINGE | INTRAMUSCULAR | 11 refills | Status: DC
Start: 2023-02-21 — End: 2023-08-20
  Filled 2023-02-21: qty 1, 28d supply, fill #0
  Filled 2023-03-26: qty 1, 28d supply, fill #1
  Filled 2023-05-07: qty 1, 28d supply, fill #2
  Filled 2023-06-10: qty 1, 28d supply, fill #3
  Filled 2023-07-09: qty 1, 28d supply, fill #4
  Filled 2023-08-12 (×2): qty 1, 28d supply, fill #5

## 2023-02-21 MED ORDER — FLUOXETINE HCL 40 MG PO CAPS
40.0000 mg | ORAL_CAPSULE | Freq: Every day | ORAL | 3 refills | Status: DC
Start: 2023-02-22 — End: 2023-02-21
  Filled 2023-02-21: qty 30, 30d supply, fill #0

## 2023-02-21 MED ORDER — ARIPIPRAZOLE 10 MG PO TABS
10.0000 mg | ORAL_TABLET | Freq: Every day | ORAL | 0 refills | Status: DC
Start: 2023-02-21 — End: 2023-05-28
  Filled 2023-02-21: qty 20, 20d supply, fill #0

## 2023-02-21 NOTE — Telephone Encounter (Signed)
Thank you for this update 

## 2023-02-21 NOTE — Telephone Encounter (Signed)
PT NEEDED A PRIOR APPROVAL FOR ABILIFY 400 IT WAS APPROVED TODAY

## 2023-02-21 NOTE — Telephone Encounter (Signed)
Medication refilled and sent to preferred pharmacy

## 2023-02-21 NOTE — Telephone Encounter (Signed)
Called patient to check on her insurance status. She was told to change her MCD to [place] and has previously been given samples but that is not to be continued as she should have medicaid. Mom states she does have medicaid in  now but not sure if the shot will be at the pharmacy today and patient is scheduled for today. Mom asked her rxs including the inj of Abilify M be changed and moved to [facility] from [facility]. WIll ask provider to move them this am, if not available today told mom to bring patient on Tues am or Wed pm.

## 2023-02-26 ENCOUNTER — Telehealth (HOSPITAL_COMMUNITY): Payer: Self-pay | Admitting: Student in an Organized Health Care Education/Training Program

## 2023-02-26 ENCOUNTER — Other Ambulatory Visit (HOSPITAL_COMMUNITY): Payer: Self-pay

## 2023-02-26 ENCOUNTER — Telehealth (HOSPITAL_COMMUNITY): Payer: Self-pay | Admitting: *Deleted

## 2023-02-26 ENCOUNTER — Other Ambulatory Visit: Payer: Self-pay

## 2023-02-26 NOTE — Telephone Encounter (Signed)
[name] MD spoke with nursing staff that paitents mom had called her and has been unable to pick up patients shot for this weeks appt because its 4000.00. PA has been done thru her MCD. [facility] and they have her shot but need her MCD ID # before they can release it and be reimbursed for it. I called mom to explain this, she verbalized her understanding and will call DSS tomorrow to get the MCD ID from her. I called [name] to see if they could provide me the ID # and I could give it to the pharmacy but was told no, mom or patient will have to call the case worker or the eligibility #. I will call mom back and give her the eligibility number but she had planned to contact DSS in the am so she can get the shot for her scheduled appt on thurs the 9th.

## 2023-02-27 NOTE — Telephone Encounter (Signed)
Eileen Rivas called mom yesterday and clarified that the pharmacy needs paperwork from her proving Medicaid, Eileen Rivas got approval last week from [facility], at this point in time it will be up to patient family to bring paperwork to pharamcy to pick up the medication.  - Documentation - Thanks

## 2023-02-28 ENCOUNTER — Ambulatory Visit (INDEPENDENT_AMBULATORY_CARE_PROVIDER_SITE_OTHER): Payer: No Payment, Other

## 2023-02-28 ENCOUNTER — Encounter (HOSPITAL_COMMUNITY): Payer: Self-pay

## 2023-02-28 ENCOUNTER — Other Ambulatory Visit: Payer: Self-pay

## 2023-02-28 ENCOUNTER — Other Ambulatory Visit (HOSPITAL_COMMUNITY): Payer: Self-pay

## 2023-02-28 VITALS — BP 117/82 | HR 97 | Ht 61.0 in | Wt 165.2 lb

## 2023-02-28 DIAGNOSIS — F203 Undifferentiated schizophrenia: Secondary | ICD-10-CM

## 2023-02-28 DIAGNOSIS — F422 Mixed obsessional thoughts and acts: Secondary | ICD-10-CM

## 2023-02-28 DIAGNOSIS — F411 Generalized anxiety disorder: Secondary | ICD-10-CM

## 2023-02-28 DIAGNOSIS — F200 Paranoid schizophrenia: Secondary | ICD-10-CM | POA: Diagnosis not present

## 2023-02-28 DIAGNOSIS — F250 Schizoaffective disorder, bipolar type: Secondary | ICD-10-CM

## 2023-02-28 DIAGNOSIS — F251 Schizoaffective disorder, depressive type: Secondary | ICD-10-CM

## 2023-02-28 MED ORDER — ARIPIPRAZOLE ER 400 MG IM PRSY
400.0000 mg | PREFILLED_SYRINGE | Freq: Once | INTRAMUSCULAR | Status: AC
  Administered 2023-02-28: 400 mg via INTRAMUSCULAR

## 2023-02-28 NOTE — Progress Notes (Signed)
Patient arrived for Abilify 400 mg  Injection . Tolerated Injection Well in right Arm Pleasant not Talkative. But smiles & will Speak when necessary . NO HI/SI  NOR AH/VH

## 2023-03-05 ENCOUNTER — Telehealth (HOSPITAL_COMMUNITY): Payer: Self-pay | Admitting: *Deleted

## 2023-03-05 NOTE — Telephone Encounter (Signed)
Fax received for Abilify Maintena 400mg  prior authorization. Called [place] [facility] spoke with Dasia who gave approval until 02/28/24 VH#[id number]. Called to notify pharmacy.

## 2023-03-22 ENCOUNTER — Telehealth (HOSPITAL_COMMUNITY): Payer: Self-pay | Admitting: *Deleted

## 2023-03-22 NOTE — Telephone Encounter (Signed)
Reviewing no show list, patient is not late but I did not see a future appt provided for her next shot. I called mobile number and message left with shot clinic times and to call the office if needed an exception.

## 2023-03-26 ENCOUNTER — Other Ambulatory Visit (HOSPITAL_COMMUNITY): Payer: Self-pay

## 2023-03-26 ENCOUNTER — Other Ambulatory Visit: Payer: Self-pay

## 2023-03-28 ENCOUNTER — Encounter (HOSPITAL_COMMUNITY): Payer: Self-pay

## 2023-03-28 ENCOUNTER — Ambulatory Visit (INDEPENDENT_AMBULATORY_CARE_PROVIDER_SITE_OTHER): Payer: Medicaid Other | Admitting: *Deleted

## 2023-03-28 VITALS — BP 108/65 | HR 84 | Resp 16 | Ht 61.0 in | Wt 171.0 lb

## 2023-03-28 DIAGNOSIS — F200 Paranoid schizophrenia: Secondary | ICD-10-CM | POA: Diagnosis not present

## 2023-03-28 DIAGNOSIS — F250 Schizoaffective disorder, bipolar type: Secondary | ICD-10-CM | POA: Diagnosis not present

## 2023-03-28 MED ORDER — ARIPIPRAZOLE ER 400 MG IM PRSY
400.0000 mg | PREFILLED_SYRINGE | Freq: Once | INTRAMUSCULAR | Status: AC
Start: 2023-03-28 — End: 2023-03-28
  Administered 2023-03-28: 400 mg via INTRAMUSCULAR

## 2023-03-28 NOTE — Progress Notes (Signed)
Patient arrived with her medication Abilify maintena 400mg . Mother is with her and states she got a Engineer, technical sales from nurse Fannie Knee to come during walk in clinic hours for her injection because she was overdue. Given in Left Deltoid without issues or complaints. States she is doing better and feeling improvements. She is going to dance class this evening. Pleasant and cooperative. She was given an appointment for her next injection and asked mother to make appointments in the future to keep from having to wait and being over booked. Both verbalized understanding.

## 2023-04-08 ENCOUNTER — Other Ambulatory Visit (HOSPITAL_COMMUNITY): Payer: Self-pay

## 2023-04-08 ENCOUNTER — Encounter (HOSPITAL_COMMUNITY): Payer: Self-pay | Admitting: Student in an Organized Health Care Education/Training Program

## 2023-04-08 ENCOUNTER — Ambulatory Visit (INDEPENDENT_AMBULATORY_CARE_PROVIDER_SITE_OTHER): Payer: Medicaid Other | Admitting: Student in an Organized Health Care Education/Training Program

## 2023-04-08 VITALS — BP 105/75 | HR 77 | Resp 12 | Wt 170.0 lb

## 2023-04-08 DIAGNOSIS — Z5181 Encounter for therapeutic drug level monitoring: Secondary | ICD-10-CM

## 2023-04-08 DIAGNOSIS — F203 Undifferentiated schizophrenia: Secondary | ICD-10-CM | POA: Diagnosis not present

## 2023-04-08 DIAGNOSIS — F422 Mixed obsessional thoughts and acts: Secondary | ICD-10-CM | POA: Diagnosis not present

## 2023-04-08 MED ORDER — FLUOXETINE HCL 20 MG PO CAPS
20.0000 mg | ORAL_CAPSULE | Freq: Every day | ORAL | 2 refills | Status: DC
Start: 2023-04-08 — End: 2023-05-28
  Filled 2023-04-08: qty 30, 30d supply, fill #0

## 2023-04-08 MED ORDER — FLUOXETINE HCL 40 MG PO CAPS
40.0000 mg | ORAL_CAPSULE | Freq: Every day | ORAL | 2 refills | Status: DC
Start: 2023-04-08 — End: 2023-05-28
  Filled 2023-04-08: qty 30, 30d supply, fill #0

## 2023-04-08 NOTE — Progress Notes (Signed)
BH MD/PA/NP OP Progress Note  04/08/2023 4:20 PM Eileen Rivas  MRN:  [id number]  Chief Complaint:  Chief Complaint  Patient presents with   Follow-up   HPI: Eileen Rivas is a 22 year old, [place]-American, patient with a PPH of schizophrenia and OCD.   Recent Abilify Maintenna: 03/28/2023  Patient reports she is doing well. She has been compliant with her Prozac. She reports that she still has the obssessive thoughts and they are bothering her. Patient reports that the thoughts tell that she gives her heart to the devil. Patient reports that it is becoming easier to push them out. Patient reports that she is more concerned about her OCD with washing her hands, brushing her teeth, and showering. Patient reports that her showering is starting to get better. Patient reports that she is averaging 8-9hrs. Now. Patient reports that she has been able to get out more, she has been going to the mall, she had a birthday party this past weekend. Patient reports that she started dance class, she enjoys it. She does feel like she can focus, but it is less than how she used to be in the past in terms of focus. Patient reports that she is able to catch on to the dances, but recognizes that she is not able to catch on as fast as she did  4 years ago. Patient reports that this current bout of dance class has been going on for 2 months. Patient reports that she will talk to other people while she is in the class. Patient endorses that she enjoys the class. Patient denies SI and HI. Patient reports that she does sudden getting feelings of wanting to suddenly take control of the steering wheel when her mom drives. Patient reports that she has never done or acted on these impulsive thoughts and urges. Patient reports that she will feel like someone is trying to force her to do something "weird or dangerous." Patient reports that she feels like these are "impulsive thoughts."  Patient reports that sometimes she feels like  her body is moving without her control, and like she does not have control over it and she has to work hard to regain a sense of control over her body. Patient reports that these usually happen when she is zoning out.   Patient reports a good appetite. Patient reports that she may see someone in the corner of her eye, 1 time/ week waving at her, but if she look head on there was no one there. Patient reports that this is uncomfortable. Most the time the figure looks the same, but the figure is overall blurry. Patient denies AH.   Visit Diagnosis:    ICD-10-CM   1. 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 Mixed obsessional thoughts and acts  F42.2     3. Medication monitoring encounter  Z51.81       Past Psychiatric History:  Mom brought labs from 06/28/2022- Pertinent labs include: TSH 3.98 Cholesterol: Total 210/ HDL 42/triglycerides 196/LDL 134 Prolactin 131.9 Vitamin D: 12 B12: 570 WBC 4.33/Hgb 12.4/MCV 85.5/platelets 347 Inpatient: [facility] H-04/2020, diagnosed OCD discharged on Zyprexa 5 mg nightly, Zoloft 50 mg daily, trazodone 50 mg nightly Outpatient: Dr. Otto Herb saw 3 weeks ago, Risperdal Consta 50 mg q. 14 days, Prozac 20 mg, Cogentin 0.5 mg-diagnosed with schizophrenia and OCD No other prior medications known Patient has history of poor compliance hence Risperdal Consta   Last visit: 07/24/2022: Patient had received her Risperdal Consta injection the day  prior however she was presenting with TD symptoms.  Recommended discontinuing this medication, also discontinued previously provided Cogentin 0.5 mg.  Continue patient's Prozac 20 mg.  Messaged patient's PCP regarding possible MRI head.   08/08/2022: Patient was started on Abilify 10 mg and continuing her Prozac 20 mg.  Patient appeared to have brighter affect, and decrease in parkinsonism symptoms.   09/2022: Patient started on Abilify Maintena, with 14-day taper as patient was no longer endorsing drug-induced  parkinsonism features.  Patient Prozac was continued.   10/2022- OCD continues to be a problem, lots of showering time, brushing teeth until gums bled, intrusive thoughts telling her that if she did something it would kill the family dog. Prozac increase to 40mg , Abilify Maintena 400mg  continued   12/2022-patient had been without her Prozac for approximately 1 month but endorses that she had seen some improvements in her mood when she had been on the medication.  I did restart patient's Prozac titration.Patient's family appears to really struggle with understanding patient's dx and there does appear to be low health literacy amongst family members, which is contributing to poor medication compliance. Becoming more comfortable telling provider what the obsessive thoughts are during session, but it does require coaxing. Patient endorses still feeling scared, but still trying use some logic with certain thoughts almost as if to reality test.   01/2023- Patient had not been compliant with any meds. Was endorsing increase in intrusive thoughts, and worsening symptoms. Restart Prozac 20mg  daily x 7 days then increase to 40mg  daily. And restart Abilify Maintenna 400mg  IM and 14 days of Abilify 10mg  daily Past Medical History:  Past Medical History:  Diagnosis Date   Anxiety    Depression    Obsessive-compulsive disorder    No past surgical history on file.  Family Psychiatric History: Per mother, patient's father has history of undiagnosed psychiatric disorder but had frequent paranoid episode started in his early 65s and continues to now.     Family History: No family history on file.  Social History:  Social History   Socioeconomic History   Marital status: Single    Spouse name: Not on file   Number of children: 0   Years of education: Not on file   Highest education level: Not on file  Occupational History   Occupation: student    Comment: [employer]  Tobacco Use   Smoking status:  Never   Smokeless tobacco: Never  Vaping Use   Vaping Use: Never used  Substance and Sexual Activity   Alcohol use: Yes    Comment: occassional   Drug use: No   Sexual activity: Never  Other Topics Concern   Not on file  Social History Narrative   Not on file   Social Determinants of Health   Financial Resource Strain: Low Risk  (07/24/2022)   Overall Financial Resource Strain (CARDIA)    Difficulty of Paying Living Expenses: Not hard at all  Food Insecurity: No Food Insecurity (07/24/2022)   Hunger Vital Sign    Worried About Running Out of Food in the Last Year: Never true    Ran Out of Food in the Last Year: Never true  Transportation Needs: No Transportation Needs (07/24/2022)   PRAPARE - Administrator, Civil Service (Medical): No    Lack of Transportation (Non-Medical): No  Physical Activity: Inactive (07/24/2022)   Exercise Vital Sign    Days of Exercise per Week: 0 days    Minutes of Exercise per Session:  0 min  Stress: Stress Concern Present (07/24/2022)   [employer] of Occupational Health - Occupational Stress Questionnaire    Feeling of Stress : To some extent  Social Connections: Socially Isolated (07/24/2022)   Social Connection and Isolation Panel [NHANES]    Frequency of Communication with Friends and Family: More than three times a week    Frequency of Social Gatherings with Friends and Family: Not on file    Attends Religious Services: Never    Database administrator or Organizations: No    Attends Engineer, structural: Never    Marital Status: Never married    Allergies: No Known Allergies  Metabolic Disorder Labs: No results found for: "HGBA1C", "MPG" No results found for: "PROLACTIN" No results found for: "CHOL", "TRIG", "HDL", "CHOLHDL", "VLDL", "LDLCALC" Lab Results  Component Value Date   TSH 10.615 (H) 05/02/2020    Therapeutic Level Labs: No results found for: "LITHIUM" No results found for: "VALPROATE" No results  found for: "CBMZ"  Current Medications: Current Outpatient Medications  Medication Sig Dispense Refill   ARIPiprazole (ABILIFY) 10 MG tablet Take 1 tablet (10 mg total) by mouth daily. 20 tablet 0   ARIPiprazole ER (ABILIFY MAINTENA) 400 MG PRSY prefilled syringe Inject 400 mg into the muscle every 28 (twenty-eight) days. 1 each 11   hydrOXYzine (ATARAX) 25 MG tablet Take 1 tablet (25 mg total) by mouth at bedtime as needed. 30 tablet 3   No current facility-administered medications for this visit.     Musculoskeletal: Strength & Muscle Tone: within normal limits Gait & Station: normal Patient leans: N/A  Psychiatric Specialty Exam: Review of Systems  Psychiatric/Behavioral:  Positive for hallucinations. Negative for dysphoric mood and suicidal ideas.     Blood pressure 105/75, pulse 77, resp. rate 12, weight 170 lb (77.1 kg), SpO2 100 %.Body mass index is 32.12 kg/m.  General Appearance: Casual  Eye Contact:  Good  Speech:  Clear and Coherent  Volume:  Normal  Mood:  Euthymic  Affect:   mostly restricted but occasional  displays of emotion while talking  Thought Process:  Goal Directed  Orientation:  Full (Time, Place, and Person)  Thought Content: Logical   Suicidal Thoughts:  No  Homicidal Thoughts:  No  Memory:  Immediate;   Good Recent;   Fair  Judgement:  Good  Insight:  Fair  Psychomotor Activity:  Normal  Concentration:  Concentration: Good  Recall:  NA  Fund of Knowledge: Good  Language: Good  Akathisia:  No  Handed:    AIMS (if indicated): not done  Assets:  Communication Skills Desire for Improvement Housing Leisure Time Resilience Social Support  ADL's:  Intact  Cognition: WNL  Sleep:  Good   Screenings: AIMS    Flowsheet Row Clinical Support from 09/25/2022 in [facility] Clinical Support from 08/08/2022 in [facility] Office Visit from 07/24/2022 in [facility]) [facility]  AIMS Total Score 0 1 2 0      AUDIT    Flowsheet Row [facility]  Alcohol Use Disorder Identification Test Final Score (AUDIT) 0      PHQ2-9    Flowsheet Row Office Visit from 07/24/2022 in [facility] ED from 11/08/2020 in [facility] ED from 05/02/2020 in [facility]  PHQ-2 Total  Score 4 2 3   PHQ-9 Total Score 13 12 15       Flowsheet Row ED from 11/08/2020 in [facility] Admission (Discharged) [facility]  C-SSRS RISK CATEGORY High Risk No Risk        Assessment and Plan: Patient continues to endorse OCD symptoms as well as some symptoms of dissociation?,  Will increase patient's Prozac as she has been compliant.  Patient does endorse seeing improvements as did her mother who was present.  Patient will likely continue to see improvements with continued compliance.  Patient did appear to have more affect today and was slightly more verbal than usual.  Patient is still very hyper fixated on details of her health care, including a number of pills she takes.  Patient will manage taking 2 pills versus 3, 20 mg pills better for compliance at 60 mg of Prozac.  We will hold on head imaging until prolactin level was drawn. OCD  Schizophrenia, unspecified  -Increase Prozac to 60mg  daily -Continue Abilify Maintena 400 mg IM q. 28 days -Scheduled for labs:Prolactin, lipids, A1c, CBC, CMP , TSH, Vit D - Referral for EKG - Referral for PCP  Collaboration of Care: Collaboration of Care:   Patient/Guardian was advised Release of Information must be obtained prior to any record release in order to collaborate their care with an outside provider. Patient/Guardian was  advised if they have not already done so to contact the registration department to sign all necessary forms in order for [place] to release information regarding their care.   Consent: Patient/Guardian gives verbal consent for treatment and assignment of benefits for services provided during this visit. Patient/Guardian expressed understanding and agreed to proceed.   PGY-3  Bobbye Morton, MD 04/08/2023, 4:20 PM

## 2023-04-11 ENCOUNTER — Other Ambulatory Visit (HOSPITAL_COMMUNITY): Payer: Self-pay

## 2023-04-12 ENCOUNTER — Other Ambulatory Visit: Payer: Self-pay

## 2023-05-02 ENCOUNTER — Telehealth (HOSPITAL_COMMUNITY): Payer: Self-pay

## 2023-05-02 ENCOUNTER — Other Ambulatory Visit: Payer: Self-pay

## 2023-05-02 ENCOUNTER — Ambulatory Visit (HOSPITAL_COMMUNITY): Payer: MEDICAID

## 2023-05-02 NOTE — Telephone Encounter (Signed)
Appointment - Telephone call with pt's Mother to reschedule pt's missed due injection today for Thursday 05/09/23 at 1 pm.

## 2023-05-07 ENCOUNTER — Other Ambulatory Visit (HOSPITAL_COMMUNITY): Payer: Self-pay

## 2023-05-09 ENCOUNTER — Ambulatory Visit (INDEPENDENT_AMBULATORY_CARE_PROVIDER_SITE_OTHER): Payer: MEDICAID | Admitting: *Deleted

## 2023-05-09 ENCOUNTER — Encounter (HOSPITAL_COMMUNITY): Payer: Self-pay

## 2023-05-09 ENCOUNTER — Ambulatory Visit (INDEPENDENT_AMBULATORY_CARE_PROVIDER_SITE_OTHER): Payer: MEDICAID | Admitting: Student

## 2023-05-09 VITALS — BP 103/70 | HR 79 | Ht 61.0 in | Wt 174.8 lb

## 2023-05-09 DIAGNOSIS — F250 Schizoaffective disorder, bipolar type: Secondary | ICD-10-CM | POA: Diagnosis not present

## 2023-05-09 DIAGNOSIS — G4700 Insomnia, unspecified: Secondary | ICD-10-CM

## 2023-05-09 DIAGNOSIS — F411 Generalized anxiety disorder: Secondary | ICD-10-CM | POA: Diagnosis not present

## 2023-05-09 DIAGNOSIS — F422 Mixed obsessional thoughts and acts: Secondary | ICD-10-CM | POA: Diagnosis not present

## 2023-05-09 DIAGNOSIS — F200 Paranoid schizophrenia: Secondary | ICD-10-CM

## 2023-05-09 MED ORDER — ARIPIPRAZOLE ER 400 MG IM PRSY
400.0000 mg | PREFILLED_SYRINGE | Freq: Once | INTRAMUSCULAR | Status: AC
Start: 2023-05-09 — End: 2023-05-09
  Administered 2023-05-09: 400 mg via INTRAMUSCULAR

## 2023-05-09 NOTE — Progress Notes (Signed)
[facility]  Brief Progress Note  Patient seen upon request by CMA, Lupita Leash. Patient presented with mom. She reports that she has noticed her tongue involuntarily moving from side to side inside of her mouth. When explored further, patient notes an urge to move her tongue, and when she notices it occurring, she is able to stop it with the entire episode lasting approximately 10 seconds. These episodes occur 2x per week, on average. She wanted to bring it to the attention of a physician because she experienced a similar phenomenon when she took Risperdal Consta. However, on the Risperdal, it was more of an involuntary movement, she did not notice it as readily, and she had difficulty discontinuing the movements on her own.   Provided psychoeducation on akathisia and offered to start Propranolol. Patient reports that the movement is annoying, but it is not unbearable, so she does not prefer to add another agent at this time. She was given RTC and /or [facility] protocol, and she was agreeable.   Mom reported questions about primary care so that patient is able to get MRI 2/2 concerns about memory lapses. She was reminded that per Dr. Morrie Sheldon, labs were first needed but advised that a referral will be placed to [facility] or IMTS clinic, and they were appreciative.  Physical exam: General: Well-appearing, in no acute distress Respiratory: Effort Normal Neurological: Muscles of facial expression without involuntary movements. No involuntary movements of tongue with protrusion. No involuntary movements of neck, trunk, nor extremities.  AIMS=0.   Assessment/Plan:  Reported symptoms c/w akathisia. Per Dr. Morrie Sheldon, who was patient's previous provider, as patient with hx of OCD that presents similarly with "urges" that she has the ability to control, concerns for tics. Advise primary psychiatric provider to assess further. No additional psychotropics to be initiated today. Patient mostly  compliant with Prozac, missing 2 doses per week, on average. She has been compliant with her monthly LAI. Patient still needs monitoring labs obtained. Depending on repeat PRL, may require head imaging (consider obtaining anyway, as she never had imaging in first episode psychosis workup due to lack of insurance coverage at the time).  -F/u with Carlyn Reichert, MD 8/15 @ 1 PM -Referral placed to IMTS for primary care -Monitoring labs pending: Prolactin, CMET, CBC, A1c, Lipid panel, TSH, and Vit D.  Lamar Sprinkles, MD PGY-3 05/09/2023  3:10 PM [facility]

## 2023-05-09 NOTE — Progress Notes (Cosign Needed)
Patient arrived with her mother for her injection of Abilify Maintena 400mg . Given in Right Deltoid without issues or complaints. Pleasant and cooperative.

## 2023-05-17 ENCOUNTER — Other Ambulatory Visit (HOSPITAL_COMMUNITY): Payer: Self-pay

## 2023-05-27 ENCOUNTER — Telehealth (HOSPITAL_COMMUNITY): Payer: Self-pay

## 2023-05-27 ENCOUNTER — Other Ambulatory Visit (HOSPITAL_COMMUNITY): Payer: MEDICAID

## 2023-05-28 ENCOUNTER — Other Ambulatory Visit (HOSPITAL_COMMUNITY): Payer: Self-pay | Admitting: Student in an Organized Health Care Education/Training Program

## 2023-05-28 ENCOUNTER — Other Ambulatory Visit: Payer: Self-pay

## 2023-05-28 ENCOUNTER — Other Ambulatory Visit (HOSPITAL_COMMUNITY): Payer: Self-pay

## 2023-05-28 DIAGNOSIS — F422 Mixed obsessional thoughts and acts: Secondary | ICD-10-CM

## 2023-05-28 DIAGNOSIS — F203 Undifferentiated schizophrenia: Secondary | ICD-10-CM

## 2023-05-28 MED ORDER — FLUOXETINE HCL 20 MG PO CAPS
20.0000 mg | ORAL_CAPSULE | Freq: Every day | ORAL | 0 refills | Status: DC
Start: 2023-05-28 — End: 2023-06-07
  Filled 2023-05-28: qty 30, 30d supply, fill #0

## 2023-05-28 MED ORDER — FLUOXETINE HCL 40 MG PO CAPS
40.0000 mg | ORAL_CAPSULE | Freq: Every day | ORAL | 0 refills | Status: DC
Start: 2023-05-28 — End: 2023-06-07
  Filled 2023-05-28: qty 30, 30d supply, fill #0

## 2023-05-28 NOTE — Telephone Encounter (Signed)
Please address concerns with Dr. [name].

## 2023-05-28 NOTE — Telephone Encounter (Signed)
I think Eileen Rivas is a little confused, Eileen Rivas has struggled with understanding refills, but it says Eileen Rivas still has 3 refills of the LAI, I did refill her Prozac as Eileen Rivas is out. Eileen Rivas will be seeing Dr. Jerrel Ivory in a few days too , he will be her provider moving forward as well, but it took some time to get in with him. Thanks!

## 2023-05-31 ENCOUNTER — Telehealth (HOSPITAL_COMMUNITY): Payer: Self-pay | Admitting: Student in an Organized Health Care Education/Training Program

## 2023-05-31 NOTE — Telephone Encounter (Signed)
Discussed labs with patient. Patient endorsed that she intends to see new IM PCP next week. Recommended diet changes as a result of hyperlipidemia and starting Vit D over the counter. Also spoke with patient about elevated TSH. Will await IM intervention given proximity of visit.   Patient appreciated call and endorsed understanding.   PGY-4  [name], MD

## 2023-06-03 ENCOUNTER — Ambulatory Visit: Payer: Medicaid Other | Admitting: Internal Medicine

## 2023-06-03 NOTE — Progress Notes (Deleted)
Hyperprolactinemia Risperdal  Patient referred to our clinic by psychiatry who raise concern regarding a history or hyperprolactinemia and memory lapses suspected but not confirmed to be due to treatment with risperidone.   Relevant recently collected labs include prolactin 16.5 TSH 5.190 but no T4 vitamin D 16.3 (L) HbA1c 5.5% lipid panel with total cholesterol 219, triglycerides 623, HDL 37, VLDL 100, LDL 52 CMP with bicarb 18 CBC normal  Any change of pregnancy? Vision loss? Thyroid sx? Nipple drainage?  Mom wants MRI 2/2 memory lapses Recommended by psych due to never having one done first time she had psychosis

## 2023-06-05 ENCOUNTER — Ambulatory Visit (HOSPITAL_COMMUNITY): Payer: MEDICAID

## 2023-06-06 ENCOUNTER — Ambulatory Visit (INDEPENDENT_AMBULATORY_CARE_PROVIDER_SITE_OTHER): Payer: MEDICAID | Admitting: Student

## 2023-06-06 VITALS — BP 126/76 | HR 78 | Ht 61.0 in | Wt 177.0 lb

## 2023-06-06 DIAGNOSIS — F422 Mixed obsessional thoughts and acts: Secondary | ICD-10-CM

## 2023-06-06 DIAGNOSIS — F203 Undifferentiated schizophrenia: Secondary | ICD-10-CM

## 2023-06-06 DIAGNOSIS — F250 Schizoaffective disorder, bipolar type: Secondary | ICD-10-CM

## 2023-06-06 DIAGNOSIS — F429 Obsessive-compulsive disorder, unspecified: Secondary | ICD-10-CM

## 2023-06-07 MED ORDER — HYDROXYZINE HCL 25 MG PO TABS
25.0000 mg | ORAL_TABLET | Freq: Every evening | ORAL | 3 refills | Status: DC | PRN
Start: 2023-06-07 — End: 2023-08-20

## 2023-06-07 MED ORDER — FLUOXETINE HCL 20 MG PO CAPS
20.0000 mg | ORAL_CAPSULE | Freq: Every day | ORAL | 2 refills | Status: DC
Start: 2023-06-07 — End: 2023-08-20

## 2023-06-07 MED ORDER — FLUOXETINE HCL 40 MG PO CAPS
40.0000 mg | ORAL_CAPSULE | Freq: Every day | ORAL | 2 refills | Status: DC
Start: 2023-06-07 — End: 2023-08-20
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
## 2023-06-10 ENCOUNTER — Other Ambulatory Visit (HOSPITAL_COMMUNITY): Payer: Self-pay

## 2023-06-11 ENCOUNTER — Other Ambulatory Visit (HOSPITAL_COMMUNITY): Payer: Self-pay

## 2023-06-11 ENCOUNTER — Ambulatory Visit (HOSPITAL_COMMUNITY): Payer: No Payment, Other

## 2023-06-12 ENCOUNTER — Encounter (HOSPITAL_COMMUNITY): Payer: Self-pay

## 2023-06-12 ENCOUNTER — Ambulatory Visit (INDEPENDENT_AMBULATORY_CARE_PROVIDER_SITE_OTHER): Payer: MEDICAID

## 2023-06-12 VITALS — BP 102/67 | HR 70 | Resp 16 | Ht 61.0 in | Wt 176.2 lb

## 2023-06-12 DIAGNOSIS — F203 Undifferentiated schizophrenia: Secondary | ICD-10-CM | POA: Diagnosis not present

## 2023-06-12 MED ORDER — ARIPIPRAZOLE ER 400 MG IM PRSY
400.0000 mg | PREFILLED_SYRINGE | Freq: Once | INTRAMUSCULAR | Status: AC
Start: 2023-06-12 — End: 2023-06-12
  Administered 2023-06-12: 400 mg via INTRAMUSCULAR

## 2023-06-12 NOTE — Progress Notes (Cosign Needed)
Patient arrived with her mother for her injection of Abilify Maintena 400mg  that she brought from her pharmacy. Given in Left Deltoid without issues or complaints. States medication is working well, no side effects. Will return in 28 days.

## 2023-06-19 ENCOUNTER — Ambulatory Visit (INDEPENDENT_AMBULATORY_CARE_PROVIDER_SITE_OTHER): Payer: MEDICAID | Admitting: Student

## 2023-06-19 ENCOUNTER — Ambulatory Visit (HOSPITAL_COMMUNITY): Payer: No Payment, Other | Admitting: Student

## 2023-06-19 DIAGNOSIS — F429 Obsessive-compulsive disorder, unspecified: Secondary | ICD-10-CM | POA: Diagnosis not present

## 2023-06-19 NOTE — Progress Notes (Signed)
  [facility] [address] [place] [address] Dept: [phone] Dept Fax: [phone]  Psychotherapy Progress Note  Patient ID: Eileen Rivas, female  DOB: March 29, 2001, 22 y.o.  MRN: [id number]  06/19/2023 Start time: 2:10 PM End time: 2:50 PM  Method of Visit: Face-to-Face  Present: mother (initially)  Current Concerns: Compulsive behaviors  Current Symptoms: Compulsive behaviors, disordered sleep  Psychiatric Specialty Exam:     General Appearance: Fairly Groomed  Eye Contact:  Good  Speech:  Clear and Coherent  Volume:  Normal  Mood:  Euthymic  Affect:  constricted  Thought Process:  Coherent  Orientation:  Full (Time, Place, and Person)  Thought Content: Logical   Suicidal Thoughts:  No  Homicidal Thoughts:  No  Memory:  Immediate;   Good  Judgement:  fair  Insight:  fair  Psychomotor Activity:  Normal  Concentration:  Concentration: Good  Recall:  Good  Fund of Knowledge: Good  Language: Good  Akathisia:  No  Handed:  not assessed  AIMS (if indicated): not done  Assets:  Communication Skills Desire for Improvement Financial Resources/Insurance Housing Leisure Time Physical Health  ADL's:  Intact  Cognition: WNL  Sleep:  Fair       Diagnosis: Obsessive-compulsive disorder  Anticipated Frequency of Visits: Once weekly Anticipated Length of Treatment Episode: To be determined  Short Term Goals/Goals for Treatment Session: Obtain pertinent background information Progress Towards Goals: Initial  Treatment Intervention: Supportive therapy  Medical Necessity: Improved patient condition  Assessment Tools:    07/24/2022    2:11 PM 11/08/2020    2:40 PM 05/02/2020    4:32 PM  Depression screen PHQ 2/9  Decreased Interest 3 1 2   Down, Depressed, Hopeless 1 1 1   PHQ - 2 Score 4 2 3   Altered sleeping 3 1 3   Tired, decreased energy 3 1 2   Change in appetite 3 2 3   Feeling bad or failure  about yourself  0 2 1  Trouble concentrating 0 3 3  Moving slowly or fidgety/restless 0 0 0  Suicidal thoughts 0 1 0  PHQ-9 Score 13 12 15    Failed to redirect to the Timeline version of the REVFS SmartLink. Flowsheet Row ED from 11/08/2020 in [facility] Admission (Discharged) [facility]  C-SSRS RISK CATEGORY High Risk No Risk       Collaboration of Care: none  Patient/Guardian was advised Release of Information must be obtained prior to any record release in order to collaborate their care with an outside provider. Patient/Guardian was advised if they have not already done so to contact the registration department to sign all necessary forms in order for [place] to release information regarding their care.   Consent: Patient/Guardian gives verbal consent for treatment and assignment of benefits for services provided during this visit. Patient/Guardian expressed understanding and agreed to proceed.   Plan:  Plan to use elements of exposure and response prevention to improve the patient's functioning and emotional wellbeing  Carlyn Reichert, MD 06/19/2023

## 2023-06-27 ENCOUNTER — Ambulatory Visit (INDEPENDENT_AMBULATORY_CARE_PROVIDER_SITE_OTHER): Payer: MEDICAID | Admitting: Student

## 2023-06-27 DIAGNOSIS — F429 Obsessive-compulsive disorder, unspecified: Secondary | ICD-10-CM

## 2023-06-28 NOTE — Progress Notes (Signed)
  [facility] [address] [place] [address] Dept: [phone] Dept Fax: [phone]  Psychotherapy Progress Note  Patient ID: Eileen Rivas, female  DOB: [birth year], 22 y.o.  MRN: [id number]  06/28/2023 Start time: 2:15 PM End time: 3:10 PM  Method of Visit: Face-to-Face  Present: mother (initially), patient, provider, R. Jolene Provost PhD  Current Concerns: Compulsive behaviors  Current Symptoms: Compulsive behaviors, disordered sleep  Psychiatric Specialty Exam:     General Appearance: Fairly Groomed  Eye Contact:  Good  Speech:  Clear and Coherent  Volume:  Normal  Mood:  "okay"  Affect:  constricted  Thought Process:  Coherent  Orientation:  Full (Time, Place, and Person)  Thought Content: Logical   Suicidal Thoughts:  No  Homicidal Thoughts:  No  Memory:  Immediate;   Good  Judgement:  fair  Insight:  poor  Psychomotor Activity:  Normal  Concentration:  Concentration: Good  Recall:  Good  Fund of Knowledge: Good  Language: Good  Akathisia:  No  Handed:  not assessed  AIMS (if indicated): not done  Assets:  Communication Skills Desire for Improvement Financial Resources/Insurance Housing Leisure Time Physical Health  ADL's:  Intact  Cognition: WNL  Sleep:  Fair       Diagnosis: Obsessive-compulsive disorder  Anticipated Frequency of Visits: Once weekly Anticipated Length of Treatment Episode: To be determined  Short Term Goals/Goals for Treatment Session: Collect data regarding compulsions Progress Towards Goals: Initial  Treatment Intervention: Supportive therapy  Medical Necessity: Improved patient condition  Assessment Tools:    07/24/2022    2:11 PM 11/08/2020    2:40 PM 05/02/2020    4:32 PM  Depression screen PHQ 2/9  Decreased Interest 3 1 2   Down, Depressed, Hopeless 1 1 1   PHQ - 2 Score 4 2 3   Altered sleeping 3 1 3   Tired, decreased energy 3 1 2   Change in appetite 3 2  3   Feeling bad or failure about yourself  0 2 1  Trouble concentrating 0 3 3  Moving slowly or fidgety/restless 0 0 0  Suicidal thoughts 0 1 0  PHQ-9 Score 13 12 15    Failed to redirect to the Timeline version of the REVFS SmartLink. Flowsheet Row ED from 11/08/2020 in [facility] Admission (Discharged) [facility]  C-SSRS RISK CATEGORY High Risk No Risk       Collaboration of Care: none  Patient/Guardian was advised Release of Information must be obtained prior to any record release in order to collaborate their care with an outside provider. Patient/Guardian was advised if they have not already done so to contact the registration department to sign all necessary forms in order for [place] to release information regarding their care.   Consent: Patient/Guardian gives verbal consent for treatment and assignment of benefits for services provided during this visit. Patient/Guardian expressed understanding and agreed to proceed.   Plan:  Patient should record and time instances of handwashing in a notebook, so that we can review for next week Plan to use elements of exposure and response prevention to improve the patient's functioning and emotional wellbeing  Carlyn Reichert, MD 06/28/2023

## 2023-07-03 ENCOUNTER — Ambulatory Visit (HOSPITAL_COMMUNITY): Payer: No Payment, Other

## 2023-07-04 ENCOUNTER — Ambulatory Visit (INDEPENDENT_AMBULATORY_CARE_PROVIDER_SITE_OTHER): Payer: MEDICAID | Admitting: Student

## 2023-07-04 DIAGNOSIS — F429 Obsessive-compulsive disorder, unspecified: Secondary | ICD-10-CM

## 2023-07-05 NOTE — Progress Notes (Signed)
[facility] [address] [place] [address] Dept: [phone] Dept Fax: [phone]  Psychotherapy Progress Note  Patient ID: Eileen Rivas, female  DOB: [birth year], 22 y.o.  MRN: [id number]  07/05/2023 Start time: 2:10 PM End time: 2:50 PM  Method of Visit: Face-to-Face  Present: mother (initially), patient, provider  Current Concerns: Compulsive behaviors  Current Symptoms: Compulsive behaviors, disordered sleep  Psychiatric Specialty Exam:     General Appearance: Fairly Groomed  Eye Contact:  Good  Speech:  Clear and Coherent  Volume:  Normal  Mood:  "okay"  Affect:  constricted  Thought Process:  Coherent  Orientation:  Full (Time, Place, and Person)  Thought Content: Logical   Suicidal Thoughts:  No  Homicidal Thoughts:  No  Memory:  Immediate;   Good  Judgement:  fair  Insight:  poor  Psychomotor Activity:  Normal  Concentration:  Concentration: Good  Recall:  Good  Fund of Knowledge: Good  Language: Good  Akathisia:  No  Handed:  not assessed  AIMS (if indicated): not done  Assets:  Communication Skills Desire for Improvement Financial Resources/Insurance Housing Leisure Time Physical Health  ADL's:  Intact  Cognition: WNL  Sleep:  Fair       Diagnosis: Obsessive-compulsive disorder  Anticipated Frequency of Visits: Once weekly Anticipated Length of Treatment Episode: To be determined  Short Term Goals/Goals for Treatment Session: Collect data regarding compulsions Progress Towards Goals: Initial  Treatment Intervention: Supportive therapy  Medical Necessity: Improved patient condition  Assessment Tools:    07/24/2022    2:11 PM 11/08/2020    2:40 PM 05/02/2020    4:32 PM  Depression screen PHQ 2/9  Decreased Interest 3 1 2   Down, Depressed, Hopeless 1 1 1   PHQ - 2 Score 4 2 3   Altered sleeping 3 1 3   Tired, decreased energy 3 1 2   Change in appetite 3 2 3   Feeling bad or  failure about yourself  0 2 1  Trouble concentrating 0 3 3  Moving slowly or fidgety/restless 0 0 0  Suicidal thoughts 0 1 0  PHQ-9 Score 13 12 15    Failed to redirect to the Timeline version of the REVFS SmartLink. Flowsheet Row ED from 11/08/2020 in [facility] Admission (Discharged) [facility]  C-SSRS RISK CATEGORY High Risk No Risk       Collaboration of Care: none  Patient/Guardian was advised Release of Information must be obtained prior to any record release in order to collaborate their care with an outside provider. Patient/Guardian was advised if they have not already done so to contact the registration department to sign all necessary forms in order for [place] to release information regarding their care.   Consent: Patient/Guardian gives verbal consent for treatment and assignment of benefits for services provided during this visit. Patient/Guardian expressed understanding and agreed to proceed.   Plan:  Patient should record and time instances of handwashing in a notebook, so that we can review for next week (patient unable to complete this but wants to try again) Plan to introduce relaxation techniques and adaptive self-soothing patterns once recording of symptoms is complete.   Carlyn Reichert, MD 07/05/2023

## 2023-07-09 ENCOUNTER — Other Ambulatory Visit: Payer: Self-pay

## 2023-07-09 ENCOUNTER — Other Ambulatory Visit (HOSPITAL_COMMUNITY): Payer: Self-pay

## 2023-07-09 ENCOUNTER — Ambulatory Visit (HOSPITAL_COMMUNITY): Payer: MEDICAID

## 2023-07-13 ENCOUNTER — Encounter (HOSPITAL_COMMUNITY): Payer: Self-pay

## 2023-07-13 ENCOUNTER — Other Ambulatory Visit: Payer: Self-pay

## 2023-07-16 ENCOUNTER — Encounter (HOSPITAL_COMMUNITY): Payer: Self-pay

## 2023-07-16 ENCOUNTER — Ambulatory Visit (INDEPENDENT_AMBULATORY_CARE_PROVIDER_SITE_OTHER): Payer: MEDICAID

## 2023-07-16 VITALS — BP 101/67 | HR 80 | Ht 61.0 in | Wt 180.0 lb

## 2023-07-16 DIAGNOSIS — F200 Paranoid schizophrenia: Secondary | ICD-10-CM | POA: Diagnosis not present

## 2023-07-16 DIAGNOSIS — G4700 Insomnia, unspecified: Secondary | ICD-10-CM

## 2023-07-16 DIAGNOSIS — F411 Generalized anxiety disorder: Secondary | ICD-10-CM

## 2023-07-16 MED ORDER — ARIPIPRAZOLE ER 400 MG IM PRSY
400.0000 mg | PREFILLED_SYRINGE | Freq: Once | INTRAMUSCULAR | Status: AC
Start: 2023-07-16 — End: 2023-07-16
  Administered 2023-07-16: 400 mg via INTRAMUSCULAR

## 2023-07-16 NOTE — Progress Notes (Cosign Needed)
Patient arrived for Abilify 400 mg  Injection . Tolerated Injection Well in right Arm Pleasant not Talkative. But smiles & will Speak when necessary . NO HI/SI  NOR AH/VH

## 2023-07-18 ENCOUNTER — Ambulatory Visit (HOSPITAL_COMMUNITY): Payer: MEDICAID | Admitting: Student

## 2023-07-25 ENCOUNTER — Ambulatory Visit (INDEPENDENT_AMBULATORY_CARE_PROVIDER_SITE_OTHER): Payer: MEDICAID | Admitting: Student

## 2023-07-25 DIAGNOSIS — F429 Obsessive-compulsive disorder, unspecified: Secondary | ICD-10-CM | POA: Diagnosis not present

## 2023-07-27 NOTE — Progress Notes (Signed)
  [facility] [address] [place] [address] Dept: [phone] Dept Fax: [phone]  Psychotherapy Progress Note  Patient ID: Eileen Rivas, female  DOB: 20-Jan-2001, 22 y.o.  MRN: [id number]  07/25/2023 Start time: 2:10 PM End time: 2:50 PM  Method of Visit: Face-to-Face  Present: mother (initially), patient, provider  Current Concerns: Compulsive behaviors  Current Symptoms: Compulsive behaviors, disordered sleep  Psychiatric Specialty Exam:     General Appearance: Fairly Groomed  Eye Contact:  Good  Speech:  Clear and Coherent  Volume:  Normal  Mood:  "okay"  Affect:  constricted  Thought Process:  Coherent  Orientation:  Full (Time, Place, and Person)  Thought Content: Logical   Suicidal Thoughts:  No  Homicidal Thoughts:  No  Memory:  Immediate;   Good  Judgement:  fair  Insight:  poor  Psychomotor Activity:  Normal  Concentration:  Concentration: Good  Recall:  Good  Fund of Knowledge: Good  Language: Good  Akathisia:  No  Handed:  not assessed  AIMS (if indicated): not done  Assets:  Communication Skills Desire for Improvement Financial Resources/Insurance Housing Leisure Time Physical Health  ADL's:  Intact  Cognition: WNL  Sleep:  Fair    Diagnosis: Obsessive-compulsive disorder   Anticipated Frequency of Visits: Once weekly Anticipated Length of Treatment Episode: To be determined  Short Term Goals/Goals for Treatment Session: Practice relaxation skills daily Progress Towards Goals: Initial  Treatment Intervention: Exposure response prevention  Medical Necessity: Improved patient condition  Assessment Tools:    07/27/2023    8:00 AM 07/24/2022    2:11 PM 11/08/2020    2:40 PM  Depression screen PHQ 2/9  Decreased Interest 3 3 1   Down, Depressed, Hopeless 3 1 1   PHQ - 2 Score 6 4 2   Altered sleeping 2 3 1   Tired, decreased energy 3 3 1   Change in appetite 2 3 2   Feeling bad  or failure about yourself  1 0 2  Trouble concentrating 2 0 3  Moving slowly or fidgety/restless 2 0 0  Suicidal thoughts 1 0 1  PHQ-9 Score 19 13 12     Failed to redirect to the Timeline version of the REVFS SmartLink. Flowsheet Row ED from 11/08/2020 in [facility]mission (Discharged) [facility]  C-SSRS RISK CATEGORY High Risk No Risk       GDA-7 (07/25/2023): 17   Collaboration of Care: none  Patient/Guardian was advised Release of Information must be obtained prior to any record release in order to collaborate their care with an outside provider. Patient/Guardian was advised if they have not already done so to contact the registration department to sign all necessary forms in order for [place] to release information regarding their care.   Consent: Patient/Guardian gives verbal consent for treatment and assignment of benefits for services provided during this visit. Patient/Guardian expressed understanding and agreed to proceed.   Plan:  Patient will practice sensory grounding techniques daily, with a focus on physical touch and smell. Future practice will include exposure therapy.  Carlyn Reichert, MD 07/27/2023

## 2023-08-01 ENCOUNTER — Ambulatory Visit (HOSPITAL_COMMUNITY): Payer: No Payment, Other

## 2023-08-01 ENCOUNTER — Ambulatory Visit (INDEPENDENT_AMBULATORY_CARE_PROVIDER_SITE_OTHER): Payer: MEDICAID | Admitting: Student

## 2023-08-01 DIAGNOSIS — F429 Obsessive-compulsive disorder, unspecified: Secondary | ICD-10-CM

## 2023-08-05 NOTE — Progress Notes (Signed)
  [facility] [address] [place] [address] Dept: [phone] Dept Fax: [phone]  Psychotherapy Progress Note  Patient ID: Eileen Rivas, female  DOB: [birth year], 22 y.o.  MRN: [id number]  07/25/2023 Start time: 2:10 PM End time: 2:50 PM  Method of Visit: Face-to-Face  Present: mother (initially), patient, provider  Current Concerns: Compulsive behaviors  Current Symptoms: Compulsive behaviors, disordered sleep  Psychiatric Specialty Exam:     General Appearance: Fairly Groomed  Eye Contact:  Good  Speech:  Clear and Coherent  Volume:  Normal  Mood:  "okay"  Affect:  constricted  Thought Process:  Coherent  Orientation:  Full (Time, Place, and Person)  Thought Content: Logical   Suicidal Thoughts:  No  Homicidal Thoughts:  No  Memory:  Immediate;   Good  Judgement:  fair  Insight:  poor  Psychomotor Activity:  Normal  Concentration:  Concentration: Good  Recall:  Good  Fund of Knowledge: Good  Language: Good  Akathisia:  No  Handed:  not assessed  AIMS (if indicated): not done  Assets:  Communication Skills Desire for Improvement Financial Resources/Insurance Housing Leisure Time Physical Health  ADL's:  Intact  Cognition: WNL  Sleep:  Fair    Diagnosis: Obsessive-compulsive disorder   Anticipated Frequency of Visits: Once weekly Anticipated Length of Treatment Episode: To be determined  Short Term Goals/Goals for Treatment Session: Practice relaxation skills daily Progress Towards Goals: Initial  Treatment Intervention: Exposure response prevention  Medical Necessity: Improved patient condition  Assessment Tools:    07/27/2023    8:00 AM 07/24/2022    2:11 PM 11/08/2020    2:40 PM  Depression screen PHQ 2/9  Decreased Interest 3 3 1   Down, Depressed, Hopeless 3 1 1   PHQ - 2 Score 6 4 2   Altered sleeping 2 3 1   Tired, decreased energy 3 3 1   Change in appetite 2 3 2   Feeling bad  or failure about yourself  1 0 2  Trouble concentrating 2 0 3  Moving slowly or fidgety/restless 2 0 0  Suicidal thoughts 1 0 1  PHQ-9 Score 19 13 12     Failed to redirect to the Timeline version of the REVFS SmartLink. Flowsheet Row ED from 11/08/2020 in [facility] Admission (Discharged) from [facility]  C-SSRS RISK CATEGORY High Risk No Risk       GAD-7 (07/25/2023): 17   PHQ-9 (07/25/2023): 19    Collaboration of Care: none  Patient/Guardian was advised Release of Information must be obtained prior to any record release in order to collaborate their care with an outside provider. Patient/Guardian was advised if they have not already done so to contact the registration department to sign all necessary forms in order for [place] to release information regarding their care.   Consent: Patient/Guardian gives verbal consent for treatment and assignment of benefits for services provided during this visit. Patient/Guardian expressed understanding and agreed to proceed.   Plan:  Patient has had difficulty performing homework exercises over the past 3 sessions. Future practice will include in office exposure therapy.  Carlyn Reichert, MD 08/05/2023

## 2023-08-06 ENCOUNTER — Ambulatory Visit (HOSPITAL_COMMUNITY): Payer: No Payment, Other

## 2023-08-08 ENCOUNTER — Encounter (HOSPITAL_COMMUNITY): Payer: MEDICAID | Admitting: Student

## 2023-08-12 ENCOUNTER — Other Ambulatory Visit (HOSPITAL_COMMUNITY): Payer: Self-pay

## 2023-08-13 ENCOUNTER — Other Ambulatory Visit: Payer: Self-pay

## 2023-08-13 ENCOUNTER — Ambulatory Visit (HOSPITAL_COMMUNITY): Payer: No Payment, Other

## 2023-08-15 ENCOUNTER — Ambulatory Visit (INDEPENDENT_AMBULATORY_CARE_PROVIDER_SITE_OTHER): Payer: MEDICAID | Admitting: Student

## 2023-08-15 DIAGNOSIS — F429 Obsessive-compulsive disorder, unspecified: Secondary | ICD-10-CM

## 2023-08-18 NOTE — Progress Notes (Signed)
  [facility] [address] [place] [address] Dept: [phone] Dept Fax: [phone]  Psychotherapy Progress Note  Patient ID: Eileen Rivas, female  DOB: 23-Jul-2001, 22 y.o.  MRN: [id number]  08/15/2023 Start time: 2:10 PM End time: 2:50 PM  Method of Visit: Face-to-Face  Present: mother (initially), patient, provider  Current Concerns: Compulsive behaviors  Current Symptoms: Compulsive behaviors, disordered sleep  Psychiatric Specialty Exam:     General Appearance: Fairly Groomed  Eye Contact:  Good  Speech:  Clear and Coherent  Volume:  Normal  Mood:  "okay"  Affect:  constricted  Thought Process:  Coherent  Orientation:  Full (Time, Place, and Person)  Thought Content: Logical   Suicidal Thoughts:  No  Homicidal Thoughts:  No  Memory:  Immediate;   Good  Judgement:  fair  Insight:  poor  Psychomotor Activity:  Normal  Concentration:  Concentration: Good  Recall:  Good  Fund of Knowledge: Good  Language: Good  Akathisia:  No  Handed:  not assessed  AIMS (if indicated): not done  Assets:  Communication Skills Desire for Improvement Financial Resources/Insurance Housing Leisure Time Physical Health  ADL's:  Intact  Cognition: WNL  Sleep:  Fair    Diagnosis: Obsessive-compulsive disorder   Anticipated Frequency of Visits: Once weekly Anticipated Length of Treatment Episode: To be determined  Short Term Goals/Goals for Treatment Session: In office exposure therapy Progress Towards Goals: Initial  Treatment Intervention: Exposure response prevention  Medical Necessity: Improved patient condition  Assessment Tools:    07/27/2023    8:00 AM 07/24/2022    2:11 PM 11/08/2020    2:40 PM  Depression screen PHQ 2/9  Decreased Interest 3 3 1   Down, Depressed, Hopeless 3 1 1   PHQ - 2 Score 6 4 2   Altered sleeping 2 3 1   Tired, decreased energy 3 3 1   Change in appetite 2 3 2   Feeling bad or  failure about yourself  1 0 2  Trouble concentrating 2 0 3  Moving slowly or fidgety/restless 2 0 0  Suicidal thoughts 1 0 1  PHQ-9 Score 19 13 12     Failed to redirect to the Timeline version of the REVFS SmartLink. Flowsheet Row ED from 11/08/2020 in [facility] Admission (Discharged) from [facility]  C-SSRS RISK CATEGORY High Risk No Risk       GAD-7 (07/25/2023): 17   PHQ-9 (07/25/2023): 19    Collaboration of Care: none  Patient/Guardian was advised Release of Information must be obtained prior to any record release in order to collaborate their care with an outside provider. Patient/Guardian was advised if they have not already done so to contact the registration department to sign all necessary forms in order for [place] to release information regarding their care.   Consent: Patient/Guardian gives verbal consent for treatment and assignment of benefits for services provided during this visit. Patient/Guardian expressed understanding and agreed to proceed.   Plan:  Continue in office exposure therapy, addressing compulsive behaviors (handwashing)  Carlyn Reichert, MD 08/18/2023

## 2023-08-20 ENCOUNTER — Other Ambulatory Visit (HOSPITAL_COMMUNITY): Payer: Self-pay

## 2023-08-20 ENCOUNTER — Encounter (HOSPITAL_COMMUNITY): Payer: Self-pay

## 2023-08-20 ENCOUNTER — Ambulatory Visit (INDEPENDENT_AMBULATORY_CARE_PROVIDER_SITE_OTHER): Payer: MEDICAID

## 2023-08-20 ENCOUNTER — Ambulatory Visit (INDEPENDENT_AMBULATORY_CARE_PROVIDER_SITE_OTHER): Payer: MEDICAID | Admitting: Psychiatry

## 2023-08-20 VITALS — BP 117/63 | HR 87 | Wt 184.6 lb

## 2023-08-20 DIAGNOSIS — G4700 Insomnia, unspecified: Secondary | ICD-10-CM

## 2023-08-20 DIAGNOSIS — F411 Generalized anxiety disorder: Secondary | ICD-10-CM

## 2023-08-20 DIAGNOSIS — F250 Schizoaffective disorder, bipolar type: Secondary | ICD-10-CM | POA: Diagnosis not present

## 2023-08-20 DIAGNOSIS — F422 Mixed obsessional thoughts and acts: Secondary | ICD-10-CM

## 2023-08-20 DIAGNOSIS — F410 Panic disorder [episodic paroxysmal anxiety] without agoraphobia: Secondary | ICD-10-CM

## 2023-08-20 DIAGNOSIS — F200 Paranoid schizophrenia: Secondary | ICD-10-CM | POA: Diagnosis not present

## 2023-08-20 DIAGNOSIS — Z789 Other specified health status: Secondary | ICD-10-CM

## 2023-08-20 DIAGNOSIS — F203 Undifferentiated schizophrenia: Secondary | ICD-10-CM

## 2023-08-20 MED ORDER — HYDROXYZINE HCL 25 MG PO TABS
25.0000 mg | ORAL_TABLET | Freq: Every evening | ORAL | 3 refills | Status: DC | PRN
  Filled 2023-08-20: qty 30, 30d supply, fill #0

## 2023-08-20 MED ORDER — ARIPIPRAZOLE ER 400 MG IM PRSY
400.0000 mg | PREFILLED_SYRINGE | Freq: Once | INTRAMUSCULAR | Status: AC
Start: 2023-08-20 — End: 2023-08-20
  Administered 2023-08-20: 400 mg via INTRAMUSCULAR

## 2023-08-20 MED ORDER — ABILIFY MAINTENA 400 MG IM PRSY
400.0000 mg | PREFILLED_SYRINGE | INTRAMUSCULAR | 11 refills | Status: DC
  Filled 2023-08-20: qty 1, 28d supply, fill #0

## 2023-08-20 MED ORDER — FLUOXETINE HCL 40 MG PO CAPS
40.0000 mg | ORAL_CAPSULE | Freq: Every day | ORAL | 2 refills | Status: DC
  Filled 2023-08-20: qty 30, 30d supply, fill #0

## 2023-08-20 NOTE — Progress Notes (Signed)
BH MD/PA/NP OP Progress Note  08/20/2023 2:16 PM Eileen Rivas  MRN:  [id number]  Chief Complaint: "The Abilify is working"  HPI: 22 year old female seen today for follow-up psychiatric evaluation.  She has a psychiatric history of OCD, anxiety, depression, and unspecified schizophrenia.  Currently she is managed on Abilify 400 mg monthly, Prozac 40 mg daily work daily, and hydroxyzine 25 mg 3 times daily.  She informed Clinical research associate that her medications are effective in managing her psychiatric conditions.  Today she was well-groomed, pleasant, cooperative, and engaged in conversation.  Patient informed Clinical research associate that she believes her Abilify is working.  Patient however continues to endorse visual hallucinations.  She notes that recently she saw a creepy little boy.  Patient also informed writer that at times she feels paranoid that a clown will come out and get her.  She notes that recently she has been more anxious and depressed however reports that she prefers to utilize therapy and coping skills to manage these conditions.  Patient notes that she worries about washing her hands, being exposed to germs, and being poisoned by food.  Today provider conducted a GAD-7 and patient scored a 17.  Provider also conducted a PHQ-9 the patient scored a 20.  She endorses increased appetite and weight.  Patient notes that she has gained 15 pounds since her last visit.  Today she denies SI/HI/AH.  At times she notes that she is irritable, distractible, and has fluctuations in mood.  She also notes that her sleep is poor with increasing morning that at times she sleeps 4 hours.  Provider suggested increasing patient's Prozac or adding a mood stabilizer however she was not agreeable.  At this time no medication changes made.  Patient agreeable to continue medications as prescribed.  She will follow-up with outpatient counseling for therapy.  No other concerns noted at this time. Visit Diagnosis:    ICD-10-CM   1. Weight  gain advised  Z78.9 Ambulatory referral to Nutrition and Diabetic Education    2. Schizoaffective disorder, bipolar type (HCC)  F25.0 ARIPiprazole ER (ABILIFY MAINTENA) 400 MG PRSY prefilled syringe    3. Undifferentiated schizophrenia (HCC)  F20.3 FLUoxetine (PROZAC) 40 MG capsule    4. Mixed obsessional thoughts and acts  F42.2 FLUoxetine (PROZAC) 40 MG capsule    hydrOXYzine (ATARAX) 25 MG tablet      Past Psychiatric History: OCD, Schizophrenia  Past Medical History:  Past Medical History:  Diagnosis Date   Anxiety    Depression    Obsessive-compulsive disorder    History reviewed. No pertinent surgical history.  Family Psychiatric History:  Per mother, patient's father has history of undiagnosed psychiatric disorder but had frequent paranoid episode started in his early 58s and continues to now.   Family History: History reviewed. No pertinent family history.  Social History:  Social History   Socioeconomic History   Marital status: Single    Spouse name: Not on file   Number of children: 0   Years of education: Not on file   Highest education level: Not on file  Occupational History   Occupation: student    Comment: [employer]  Tobacco Use   Smoking status: Never   Smokeless tobacco: Never  Vaping Use   Vaping status: Never Used  Substance and Sexual Activity   Alcohol use: Yes    Comment: occassional   Drug use: No   Sexual activity: Never  Other Topics Concern   Not on file  Social History Narrative  Not on file   Social Determinants of Health   Financial Resource Strain: Low Risk  (07/24/2022)   Overall Financial Resource Strain (CARDIA)    Difficulty of Paying Living Expenses: Not hard at all  Food Insecurity: Not on File (07/18/2023)   Received from [facility] Insecurity    Food: 0  Transportation Needs: No Transportation Needs (07/24/2022)   PRAPARE - Administrator, Civil Service (Medical): No    Lack of Transportation  (Non-Medical): No  Physical Activity: Inactive (07/24/2022)   Exercise Vital Sign    Days of Exercise per Week: 0 days    Minutes of Exercise per Session: 0 min  Stress: Stress Concern Present (07/24/2022)   [employer] of Occupational Health - Occupational Stress Questionnaire    Feeling of Stress : To some extent  Social Connections: Not on File (07/16/2023)   Received from [facility]   Social Connections    Connectedness: 0    Allergies: No Known Allergies  Metabolic Disorder Labs: Lab Results  Component Value Date   HGBA1C 5.5 05/28/2023   Lab Results  Component Value Date   PROLACTIN 16.5 05/28/2023   Lab Results  Component Value Date   CHOL 219 (H) 05/28/2023   TRIG 623 (HH) 05/28/2023   HDL 37 (L) 05/28/2023   CHOLHDL 5.9 (H) 05/28/2023   LDLCALC 82 05/28/2023   Lab Results  Component Value Date   TSH 5.190 (H) 05/28/2023   TSH 10.615 (H) 05/02/2020    Therapeutic Level Labs: No results found for: "LITHIUM" No results found for: "VALPROATE" No results found for: "CBMZ"  Current Medications: Current Outpatient Medications  Medication Sig Dispense Refill   ARIPiprazole ER (ABILIFY MAINTENA) 400 MG PRSY prefilled syringe Inject 400 mg into the muscle every 28 (twenty-eight) days. 1 each 11   FLUoxetine (PROZAC) 40 MG capsule Take 1 capsule (40 mg total) by mouth daily. Take with 20mg . 30 capsule 2   hydrOXYzine (ATARAX) 25 MG tablet Take 1 tablet (25 mg total) by mouth at bedtime as needed. 30 tablet 3   No current facility-administered medications for this visit.     Musculoskeletal: Strength & Muscle Tone: within normal limits Gait & Station: normal Patient leans: N/A  Psychiatric Specialty Exam: Review of Systems  There were no vitals taken for this visit.There is no height or weight on file to calculate BMI.  General Appearance: Well Groomed  Eye Contact:  Good  Speech:  Clear and Coherent and Normal Rate  Volume:  Normal  Mood:  Anxious and  Depressed  Affect:  Appropriate and Congruent  Thought Process:  Coherent and Linear  Orientation:  Full (Time, Place, and Person)  Thought Content: Logical, Hallucinations: Visual, and Paranoid Ideation   Suicidal Thoughts:  No  Homicidal Thoughts:  No  Memory:  Immediate;   Good Recent;   Good Remote;   Good  Judgement:  Good  Insight:  Good  Psychomotor Activity:  Normal  Concentration:  Concentration: Good and Attention Span: Good  Recall:  Good  Fund of Knowledge: Good  Language: Good  Akathisia:  No  Handed:  Right  AIMS (if indicated): not done  Assets:  Communication Skills Desire for Improvement Financial Resources/Insurance Housing Leisure Time Physical Health Social Support  ADL's:  Intact  Cognition: WNL  Sleep:  Good   Screenings: AIMS    Flowsheet Row Clinical Support from 09/25/2022 in [facility] Clinical Support from 08/08/2022 in [facility] Office Visit from 07/24/2022 in [facility] Admission (Discharged) [facility]  AIMS Total Score 0 1 2 0      AUDIT    Flowsheet Row Admission (Discharged) [facility]  Alcohol Use Disorder Identification Test Final Score (AUDIT) 0      GAD-7    Flowsheet Row Office Visit from 08/20/2023 in [facility]  Total GAD-7 Score 17      PHQ2-9    Flowsheet Row Office Visit from 08/20/2023 in [facility] Counselor from 07/25/2023 in [facility] Office Visit from 07/24/2022 in [facility] ED from 11/08/2020 in [facility] ED from 05/02/2020 in [facility]  PHQ-2 Total Score 5 6 4 2 3   PHQ-9 Total Score 20 19 13 12 15       Flowsheet Row ED from 11/08/2020 in  [facility] Admission (Discharged) from [facility]  C-SSRS RISK CATEGORY High Risk No Risk        Assessment and Plan: Patient endorses poor sleep, increased anxiety, depression, paranoia, and VH. Provider suggested increasing patient's Prozac or adding a mood stabilizer however she was not agreeable.  At this time no medication changes made.  Patient agreeable to continue medications as prescribed.  Patient was agreeable to having referral to nutritionist.  1. Schizoaffective disorder, bipolar type (HCC)  Continue- ARIPiprazole ER (ABILIFY MAINTENA) 400 MG PRSY prefilled syringe; Inject 400 mg into the muscle every 28 (twenty-eight) days.  Dispense: 1 each; Refill: 11  2. Undifferentiated schizophrenia (HCC)  Continue- FLUoxetine (PROZAC) 40 MG capsule; Take 1 capsule (40 mg total) by mouth daily. Take with 20mg .  Dispense: 30 capsule; Refill: 2  3. Mixed obsessional thoughts and acts  Continue- FLUoxetine (PROZAC) 40 MG capsule; Take 1 capsule (40 mg total) by mouth daily. Take with 20mg .  Dispense: 30 capsule; Refill: 2 Continue- hydrOXYzine (ATARAX) 25 MG tablet; Take 1 tablet (25 mg total) by mouth at bedtime as needed.  Dispense: 30 tablet; Refill: 3  4. Weight gain advised  - Ambulatory referral to Nutrition and Diabetic Education   Collaboration of Care: Collaboration of Care: Other provider involved in patient's care AEB PCP, and counselor, and nutritionist  Patient/Guardian was advised Release of Information must be obtained prior to any record release in order to collaborate their care with an outside provider. Patient/Guardian was advised if they have not already done so to contact the registration department to sign all necessary forms in order for [place] to release information regarding their care.   Consent: Patient/Guardian gives verbal consent for treatment and assignment of benefits for services  provided during this visit. Patient/Guardian expressed understanding and agreed to proceed.    Shanna Cisco, NP 08/20/2023, 2:16 PM

## 2023-08-20 NOTE — Progress Notes (Cosign Needed)
Patient arrived for Abilify 400 mg  Injection . Tolerated Injection Well in left Arm Pleasant not Talkative. But smiles & will Speak when necessary . NO HI/SI  NOR AH/VH

## 2023-08-22 ENCOUNTER — Ambulatory Visit (INDEPENDENT_AMBULATORY_CARE_PROVIDER_SITE_OTHER): Payer: MEDICAID | Admitting: Student

## 2023-08-22 DIAGNOSIS — F429 Obsessive-compulsive disorder, unspecified: Secondary | ICD-10-CM

## 2023-08-27 NOTE — Progress Notes (Cosign Needed Addendum)
  [facility] [address] [place] [address] Dept: [phone] Dept Fax: [phone]  Psychotherapy Progress Note  Patient ID: Eileen Rivas, female  DOB: 01-22-2001, 22 y.o.  MRN: [id number]  08/22/2023 Start time: 2:10 PM End time: 2:50 PM  Method of Visit: Face-to-Face  Present: mother (initially), patient, provider  Current Concerns: Compulsive behaviors  Current Symptoms: Compulsive behaviors, disordered sleep  Psychiatric Specialty Exam:     General Appearance: Fairly Groomed  Eye Contact:  Good  Speech:  Clear and Coherent  Volume:  Normal  Mood:  "okay"  Affect:  constricted  Thought Process:  Coherent  Orientation:  Full (Time, Place, and Person)  Thought Content: Logical   Suicidal Thoughts:  No  Homicidal Thoughts:  No  Memory:  Immediate;   Good  Judgement:  fair  Insight:  poor  Psychomotor Activity:  Normal  Concentration:  Concentration: Good  Recall:  Good  Fund of Knowledge: Good  Language: Good  Akathisia:  No  Handed:  not assessed  AIMS (if indicated): not done  Assets:  Communication Skills Desire for Improvement Financial Resources/Insurance Housing Leisure Time Physical Health  ADL's:  Intact  Cognition: WNL  Sleep:  Fair    Diagnosis: Obsessive-compulsive disorder   Anticipated Frequency of Visits: Once weekly Anticipated Length of Treatment Episode: To be determined  Short Term Goals/Goals for Treatment Session: In office exposure therapy, cognitive restructuring Progress Towards Goals: Initial  Treatment Intervention: Exposure response prevention  Medical Necessity: Improved patient condition  Assessment Tools:    08/20/2023    1:52 PM 07/27/2023    8:00 AM 07/24/2022    2:11 PM  Depression screen PHQ 2/9  Decreased Interest 3 3 3   Down, Depressed, Hopeless 2 3 1   PHQ - 2 Score 5 6 4   Altered sleeping 3 2 3   Tired, decreased energy 2 3 3   Change in  appetite 3 2 3   Feeling bad or failure about yourself  2 1 0  Trouble concentrating 2 2 0  Moving slowly or fidgety/restless 3 2 0  Suicidal thoughts 0 1 0  PHQ-9 Score 20 19 13   Difficult doing work/chores Very difficult      Failed to redirect to the Timeline version of the REVFS SmartLink. Flowsheet Row ED from 11/08/2020 in [facility] Admission (Discharged) [facility]  C-SSRS RISK CATEGORY High Risk No Risk       GAD-7 (07/25/2023): 17   PHQ-9 (07/25/2023): 19    Collaboration of Care: none  Patient/Guardian was advised Release of Information must be obtained prior to any record release in order to collaborate their care with an outside provider. Patient/Guardian was advised if they have not already done so to contact the registration department to sign all necessary forms in order for [place] to release information regarding their care.   Consent: Patient/Guardian gives verbal consent for treatment and assignment of benefits for services provided during this visit. Patient/Guardian expressed understanding and agreed to proceed.   Plan:  Continue in office exposure therapy, addressing compulsive behaviors (handwashing) Address cognitive distortions surrounding self worth and familial conflict Continue to work on reality testing regarding possible delusional thoughts  Carlyn Reichert, MD 08/27/2023

## 2023-08-29 ENCOUNTER — Other Ambulatory Visit: Payer: Self-pay

## 2023-08-29 ENCOUNTER — Ambulatory Visit (INDEPENDENT_AMBULATORY_CARE_PROVIDER_SITE_OTHER): Payer: MEDICAID | Admitting: Student

## 2023-08-29 DIAGNOSIS — F422 Mixed obsessional thoughts and acts: Secondary | ICD-10-CM

## 2023-08-29 DIAGNOSIS — F429 Obsessive-compulsive disorder, unspecified: Secondary | ICD-10-CM | POA: Diagnosis not present

## 2023-08-29 DIAGNOSIS — F200 Paranoid schizophrenia: Secondary | ICD-10-CM | POA: Diagnosis not present

## 2023-08-29 DIAGNOSIS — F203 Undifferentiated schizophrenia: Secondary | ICD-10-CM | POA: Diagnosis not present

## 2023-08-29 NOTE — Progress Notes (Signed)
BH MD Outpatient Progress Note  09/02/2023 1:24 PM Lourie Kovatch  MRN:  [id number]  Assessment:  Kahlina Ruckman presents for follow-up evaluation.  At the last visit, the patient was doing fairly well with regard to symptoms of schizophrenia.  She has been engaging in psychotherapy with this provider over the past several months to work on disruptive symptoms of OCD.  Unfortunately, progress has been hindered by delusional thought content that has emerged.  The patient's reality testing remains intact, she certainly has not been responding to internal stimuli, and she has a linear and logical thought process.  The patient and her family are preparing for a long family trip beginning on December 10, with them returning 1 month later on January 10.  She has been receiving Abilify Maintena monthly but we will plan for her to receive Abilify Asimtufii when her next shot is due on November 26 so that the injection will last 2 months.  We discussed eventually adding a second antipsychotic to the patient's regimen to help with residual symptoms of schizophrenia.  This will not be done presently based on two factors: The patient going on an extended trip and lack of an EKG.  They were given information for local practices to establish care with.  Clozapine has been considered for the patient, but she has difficulty taking her medication on a daily basis, which makes this a less desirable alternative.  Ryanna Nadal is a 22 y.o. y.o. female with a history of schizophrenia and OCD who is an established patient with [facility] for management of schizophrenia and obsessive thoughts and compulsive behaviors.  It is worth noting that the patient resides with her mother [name]), who is the patient's main social support.  The patient was hospitalized in 2021 at the behavioral hospital for obsessive religious thoughts it was given the diagnosis of OCD.  Plan:  # Obsessive-compulsive  disorder Interventions: --Continue Prozac at 60 mg daily - Continue psychotherapy with this provider  # Schizophrenia Interventions: - Next Abilify administration will be Asimtufii on November 26, patient should be able to receive sample medication - The patient's prolactin has gone down from 132 to normal (16), likely due to transition from risperidone to Abilify  #Long term use of antipsychotic medication -- Lipid panel from 05/28/2023, LDL 82, triglycerides 623 - A1c from 05/28/2023, normal - No EKG, will need to message the patient's PCP once she has a PCP - AIMS of 0 on my assessment, previous concern due to somewhat volitional tongue movements, patient reports this has not been a problem  #Elevated TSH, Hypertriglyceridemia - Needs to follow-up with internal medicine clinic or other primary care physician - They expressed their intention to call and set up appointment   Patient was given contact information for behavioral health clinic and was instructed to call 911 for emergencies.   Subjective:  Chief Complaint:  Chief Complaint  Patient presents with   Follow-up    Interval History:  The patient's primary complaint, and her mother's primary complaint, remain the patient's inability to perform chores around the house due to fears of contamination.  This has been the focus of therapy with the patient.  During the course of therapy over the past several months and today during the interview, the patient talks about "the mark of the beast".  She has concerns about things that involve the word "mark".  She is able to identify that these thoughts are not logical.  The patient also reports that  their relative's dog has a collar with a tracking device on it.  The patient has quite a bit of concern about this.  She denies experiencing any auditory or visual hallucinations.   The patient reports having occasional issues with vomiting after taking Prozac.  She states that this began  approximately 1 month ago, which is quite unusual because the last dose increase was approximately 3 months ago.  The patient is unsure why this is occurring but agrees to monitor the situation.  She feels that she may need to drink more water with the medication.  The patient reports continued issues with depressed mood, overeating, and poor energy.  PHQ-9 score is a 15.  The patient denies experiencing any hopelessness or suicidal thoughts.   Visit Diagnosis:    ICD-10-CM   1. Schizophrenia, paranoid (HCC)  F20.0     2. Obsessive-compulsive disorder, unspecified type  F42.9        Past Psychiatric History: As above  Past Medical History:  Past Medical History:  Diagnosis Date   Anxiety    Depression    Obsessive-compulsive disorder    No past surgical history on file.  Family Psychiatric History: None pertinent  Family History: No family history on file.  Social History:  Social History   Socioeconomic History   Marital status: Single    Spouse name: Not on file   Number of children: 0   Years of education: Not on file   Highest education level: Not on file  Occupational History   Occupation: student    Comment: [employer]  Tobacco Use   Smoking status: Never   Smokeless tobacco: Never  Vaping Use   Vaping status: Never Used  Substance and Sexual Activity   Alcohol use: Yes    Comment: occassional   Drug use: No   Sexual activity: Never  Other Topics Concern   Not on file  Social History Narrative   Not on file   Social Determinants of Health   Financial Resource Strain: Low Risk  (07/24/2022)   Overall Financial Resource Strain (CARDIA)    Difficulty of Paying Living Expenses: Not hard at all  Food Insecurity: Not on File (07/18/2023)   Received from [facility] Insecurity    Food: 0  Transportation Needs: No Transportation Needs (07/24/2022)   PRAPARE - Administrator, Civil Service (Medical): No    Lack of Transportation  (Non-Medical): No  Physical Activity: Inactive (07/24/2022)   Exercise Vital Sign    Days of Exercise per Week: 0 days    Minutes of Exercise per Session: 0 min  Stress: Stress Concern Present (07/24/2022)   [employer] of Occupational Health - Occupational Stress Questionnaire    Feeling of Stress : To some extent  Social Connections: Not on File (07/16/2023)   Received from [facility]   Social Connections    Connectedness: 0    Allergies: No Known Allergies  Current Medications: Current Outpatient Medications  Medication Sig Dispense Refill   ARIPiprazole ER (ABILIFY MAINTENA) 400 MG PRSY prefilled syringe Inject 400 mg into the muscle every 28 (twenty-eight) days. 1 each 11   FLUoxetine (PROZAC) 40 MG capsule Take 1 capsule (40 mg total) by mouth daily. Take with 20mg . 30 capsule 2   hydrOXYzine (ATARAX) 25 MG tablet Take 1 tablet (25 mg total) by mouth at bedtime as needed. 30 tablet 3   No current facility-administered medications for this visit.     Objective:  Psychiatric  Specialty Exam: Physical Exam Constitutional:      Appearance: the patient is not toxic-appearing.  Pulmonary:     Effort: Pulmonary effort is normal.  Neurological:     General: No focal deficit present.     Mental Status: the patient is alert and oriented to person, place, and time.   Review of Systems  Respiratory:  Negative for shortness of breath.   Cardiovascular:  Negative for chest pain.  Gastrointestinal:  Negative for abdominal pain, constipation, diarrhea, nausea and vomiting.  Neurological:  Negative for headaches.      There were no vitals taken for this visit.  General Appearance: Fairly Groomed  Eye Contact:  Good  Speech:  Clear and Coherent  Volume:  Normal  Mood:  "okay"  Affect:  flat  Thought Process:  Coherent  Orientation:  Full (Time, Place, and Person)  Thought Content: Logical during interview, but does report preoccupation with fear of contamination and "the mark  of the beast"  Suicidal Thoughts:  No  Homicidal Thoughts:  No  Memory:  Immediate;   Good  Judgement:  fair  Insight:  fair  Psychomotor Activity:  Normal  Concentration:  Concentration: Good  Recall:  Good  Fund of Knowledge: Good  Language: Good  Akathisia:  No  Handed:    AIMS (if indicated): not done  Assets:  Communication Skills Desire for Improvement Financial Resources/Insurance Housing Leisure Time Physical Health  ADL's:  Intact  Cognition: WNL  Sleep:  Fair     Metabolic Disorder Labs: Lab Results  Component Value Date   HGBA1C 5.5 05/28/2023   Lab Results  Component Value Date   PROLACTIN 16.5 05/28/2023   Lab Results  Component Value Date   CHOL 219 (H) 05/28/2023   TRIG 623 (HH) 05/28/2023   HDL 37 (L) 05/28/2023   CHOLHDL 5.9 (H) 05/28/2023   LDLCALC 82 05/28/2023   Lab Results  Component Value Date   TSH 5.190 (H) 05/28/2023   TSH 10.615 (H) 05/02/2020    Therapeutic Level Labs: No results found for: "LITHIUM" No results found for: "VALPROATE" No results found for: "CBMZ"  Screenings: AIMS    Flowsheet Row Clinical Support from 09/25/2022 in [facility] Clinical Support from 08/08/2022 in [facility] Office Visit from 07/24/2022 in [facility]  AIMS Total Score 0 1 2 0      AUDIT    Flowsheet Row Ad[facility] (Discharged[facility]  Alcohol Use Disorder Identification Test Final Score (AUDIT) 0      GAD-7    Flowsheet Row Office Visit from 08/20/2023 in [facility]  Total GAD-7 Score 17      PHQ2-9    Flowsheet Row Office Visit from 08/20/2023 in [facility] Counselor from 07/25/2023 in [facility] Office Visit from  07/24/2022 in [facility] ED from 11/08/2020 in [facility] ED from 05/02/2020 in [facility]  PHQ-2 Total Score 5 6 4 2 3   PHQ-9 Total Score 20 19 13 12 15       Flowsheet Row ED from 11/08/2020 in [facility] Admission (Discharged) [facility]  C-SSRS RISK CATEGORY High Risk No Risk       Collaboration of  Care: none  A total of 30 minutes was spent involved in face to face clinical care, chart review, documentation.   Carlyn Reichert, MD 09/02/2023, 1:24 PM

## 2023-08-29 NOTE — Patient Instructions (Signed)
Please follow-up with the 2 practices listed below to obtain primary care services:  [facility] [address], [place] [phone]   [facility] [address] [place] [place] [phone]

## 2023-08-30 ENCOUNTER — Other Ambulatory Visit (HOSPITAL_COMMUNITY): Payer: Self-pay

## 2023-09-02 MED ORDER — FLUOXETINE HCL 40 MG PO CAPS: 40.0000 mg | ORAL_CAPSULE | Freq: Every day | ORAL | 2 refills | Status: DC

## 2023-09-02 MED ORDER — ARIPIPRAZOLE ER 960 MG/3.2ML IM PRSY
960.0000 mg | PREFILLED_SYRINGE | Freq: Once | INTRAMUSCULAR | Status: AC
  Administered 2023-09-17: 960 mg via INTRAMUSCULAR

## 2023-09-05 ENCOUNTER — Ambulatory Visit (INDEPENDENT_AMBULATORY_CARE_PROVIDER_SITE_OTHER): Payer: MEDICAID | Admitting: Student

## 2023-09-05 DIAGNOSIS — F429 Obsessive-compulsive disorder, unspecified: Secondary | ICD-10-CM

## 2023-09-09 ENCOUNTER — Other Ambulatory Visit (HOSPITAL_COMMUNITY): Payer: Self-pay | Admitting: Student

## 2023-09-09 NOTE — Progress Notes (Signed)
[facility] [address] [place] [address] Dept: [phone] Dept Fax: [phone]  Psychotherapy Progress Note  Patient ID: Eileen Rivas, female  DOB: 2001-03-13, 22 y.o.  MRN: [id number]  09/05/2023 Start time: 2:10 PM End time: 2:50 PM  Method of Visit: Face-to-Face  Present: patient, provider  Current Concerns: Compulsive behaviors, avoidance behaviors  Current Symptoms: Compulsive behaviors, disordered sleep  Psychiatric Specialty Exam:     General Appearance: Fairly Groomed  Eye Contact:  Good  Speech:  Clear and Coherent  Volume:  Normal  Mood:  "okay"  Affect:  constricted  Thought Process:  Coherent  Orientation:  Full (Time, Place, and Person)  Thought Content: Logical   Suicidal Thoughts:  No  Homicidal Thoughts:  No  Memory:  Immediate;   Good  Judgement:  fair  Insight:  poor  Psychomotor Activity:  Normal  Concentration:  Concentration: Good  Recall:  Good  Fund of Knowledge: Good  Language: Good  Akathisia:  No  Handed:  not assessed  AIMS (if indicated): not done  Assets:  Communication Skills Desire for Improvement Financial Resources/Insurance Housing Leisure Time Physical Health  ADL's:  Intact  Cognition: WNL  Sleep:  Fair    Diagnosis: Obsessive-compulsive disorder   Anticipated Frequency of Visits: Once weekly Anticipated Length of Treatment Episode: To be determined  Short Term Goals/Goals for Treatment Session: In office exposure therapy, cognitive restructuring Progress Towards Goals: Initial  Treatment Intervention: Exposure response prevention    09/09/23 0910  Anxiety Long Term Goals  Anxiety Long Term Goals Reduce overall level, frequency, and intensity of the anxiety so that daily functioning is not impaired;Stabilize anxiety level while increasing ability to function on a daily basis;Enhance ability to handle effectively the full variety of life's anxieties   Anxiety Short Term Goals  Anxiety Short Term Goals Acknowledge irrational nature of the fears;Identify an anxiety coping mechanism that has been successful in the past and increase its use;Verbalize an understanding of the role that fearful thinking plays in creating fears, excessive worry, and persistent anxiety symptoms  Acknowledge irrational nature of the fears Assist the client in developing an awareness of the irrational nature of his/her fears;Analyze the clients fear by examining the probability of the negative expectation occurring.  Strategies to reduce or eliminate the irrational anxiety Develop behavioral and cognitive strategies to reduce or eliminate the irrational anxiety;Learn and implement new strategies for realistically addressing fears or worries;Participate in imagined then live, exposure exercises in which worries and fears are gradually faced  Develop behavioral and cognitive strategies to reduce or eliminate the irrational anxiety Assist client in developing coping strategies for his/her anxiety  Learn and implement new strategies for realistically addressing fears or worries Assign client a homework exercise in which he/she works on solving a current problem using skills learned in therapy.  Participate in imagined then live, exposure exercises in which worries and fears are gradually faced Process the experience;Assign the client a homework exercise in which he/she does gradual exposure to identified fears and records responses  Anxiety Short Term Medication Goals Take medications as prescribed and report any side effects to appropriate professionals  Take medications as prescribed and report any side effects to appropriate professionals Monitor the client's psychotropic medications compliance, side effects, and effectiveness confer with physician  Increase participation in daily social, academic and vocational activities Encourage the client to increase daily social, academic  and vocational activities and other potentially rewarding experiences  Report a decreased  daily level of anxiety due to the use of positive self-talk Help the client develop reality based, positive cognitive messages  Learn and implement relapse prevention strategies for managing possible future fears or worries Building them into his/her life as much as possible;Instruct the client to routinely use his/her newly learned skills in relaxation, cognitive restructuring, exposure, and problem-solving exposures as needed to address emergent fears or worries.  Anxiety Treatment Plan Status  Anxiety Treatment Plan Status Progressing      Medical Necessity: Improved patient condition  Assessment Tools:    08/20/2023    1:52 PM 07/27/2023    8:00 AM 07/24/2022    2:11 PM  Depression screen PHQ 2/9  Decreased Interest 3 3 3   Down, Depressed, Hopeless 2 3 1   PHQ - 2 Score 5 6 4   Altered sleeping 3 2 3   Tired, decreased energy 2 3 3   Change in appetite 3 2 3   Feeling bad or failure about yourself  2 1 0  Trouble concentrating 2 2 0  Moving slowly or fidgety/restless 3 2 0  Suicidal thoughts 0 1 0  PHQ-9 Score 20 19 13   Difficult doing work/chores Very difficult      Failed to redirect to the Timeline version of the REVFS SmartLink. Flowsheet Row ED from 11/08/2020 in [facility] (Discharged[facility]  C-SSRS RISK CATEGORY High Risk No Risk         Collaboration of Care: none  Patient/Guardian was advised Release of Information must be obtained prior to any record release in order to collaborate their care with an outside provider. Patient/Guardian was advised if they have not already done so to contact the registration department to sign all necessary forms in order for [place] to release information regarding their care.   Consent: Patient/Guardian gives verbal consent for treatment and assignment  of benefits for services provided during this visit. Patient/Guardian expressed understanding and agreed to proceed.   Plan:  Continue in office exposure therapy, addressing compulsive behaviors (handwashing) as well as avoidance behaviors (not handling trash) Address cognitive distortions surrounding self worth and familial conflict Continue to work on reality testing regarding possible delusional thoughts  Carlyn Reichert, MD 09/09/2023

## 2023-09-12 ENCOUNTER — Ambulatory Visit (INDEPENDENT_AMBULATORY_CARE_PROVIDER_SITE_OTHER): Payer: MEDICAID | Admitting: Student

## 2023-09-12 DIAGNOSIS — F429 Obsessive-compulsive disorder, unspecified: Secondary | ICD-10-CM

## 2023-09-15 NOTE — Progress Notes (Signed)
[facility] [address] [place] [address] Dept: [phone] Dept Fax: [phone]  Psychotherapy Progress Note  Patient ID: Eileen Rivas, female  DOB: [birth year], 22 y.o.  MRN: [id number]  09/12/2023 Start time: 2:10 PM End time: 2:50 PM  Method of Visit: Face-to-Face  Present: patient, provider  Current Concerns: Compulsive behaviors, avoidance behaviors  Current Symptoms: Compulsive behaviors, disordered sleep  Psychiatric Specialty Exam:     General Appearance: Fairly Groomed  Eye Contact:  Good  Speech:  Clear and Coherent  Volume:  Normal  Mood:  "okay"  Affect:  constricted  Thought Process:  Coherent  Orientation:  Full (Time, Place, and Person)  Thought Content: Logical   Suicidal Thoughts:  No  Homicidal Thoughts:  No  Memory:  Immediate;   Good  Judgement:  fair  Insight:  poor  Psychomotor Activity:  Normal  Concentration:  Concentration: Good  Recall:  Good  Fund of Knowledge: Good  Language: Good  Akathisia:  No  Handed:  not assessed  AIMS (if indicated): not done  Assets:  Communication Skills Desire for Improvement Financial Resources/Insurance Housing Leisure Time Physical Health  ADL's:  Intact  Cognition: WNL  Sleep:  Fair    Diagnosis: Obsessive-compulsive disorder   Anticipated Frequency of Visits: Once weekly Anticipated Length of Treatment Episode: To be determined  Short Term Goals/Goals for Treatment Session: In office exposure therapy, cognitive restructuring Progress Towards Goals: Initial  Treatment Intervention: Exposure response prevention    09/09/23 0910  Anxiety Long Term Goals  Anxiety Long Term Goals Reduce overall level, frequency, and intensity of the anxiety so that daily functioning is not impaired;Stabilize anxiety level while increasing ability to function on a daily basis;Enhance ability to handle effectively the full variety of life's anxieties   Anxiety Short Term Goals  Anxiety Short Term Goals Acknowledge irrational nature of the fears;Identify an anxiety coping mechanism that has been successful in the past and increase its use;Verbalize an understanding of the role that fearful thinking plays in creating fears, excessive worry, and persistent anxiety symptoms  Acknowledge irrational nature of the fears Assist the client in developing an awareness of the irrational nature of his/her fears;Analyze the clients fear by examining the probability of the negative expectation occurring.  Strategies to reduce or eliminate the irrational anxiety Develop behavioral and cognitive strategies to reduce or eliminate the irrational anxiety;Learn and implement new strategies for realistically addressing fears or worries;Participate in imagined then live, exposure exercises in which worries and fears are gradually faced  Develop behavioral and cognitive strategies to reduce or eliminate the irrational anxiety Assist client in developing coping strategies for his/her anxiety  Learn and implement new strategies for realistically addressing fears or worries Assign client a homework exercise in which he/she works on solving a current problem using skills learned in therapy.  Participate in imagined then live, exposure exercises in which worries and fears are gradually faced Process the experience;Assign the client a homework exercise in which he/she does gradual exposure to identified fears and records responses  Anxiety Short Term Medication Goals Take medications as prescribed and report any side effects to appropriate professionals  Take medications as prescribed and report any side effects to appropriate professionals Monitor the client's psychotropic medications compliance, side effects, and effectiveness confer with physician  Increase participation in daily social, academic and vocational activities Encourage the client to increase daily social, academic  and vocational activities and other potentially rewarding experiences  Report a decreased  daily level of anxiety due to the use of positive self-talk Help the client develop reality based, positive cognitive messages  Learn and implement relapse prevention strategies for managing possible future fears or worries Building them into his/her life as much as possible;Instruct the client to routinely use his/her newly learned skills in relaxation, cognitive restructuring, exposure, and problem-solving exposures as needed to address emergent fears or worries.  Anxiety Treatment Plan Status  Anxiety Treatment Plan Status Progressing, as evidenced by the patient completing her most recent task of taking the trash out.  Within sessions she has been amenable to cognitive reframing, as well as thinking through problem solving techniques for reducing anxiety.      Medical Necessity: Improved patient condition  Assessment Tools:    08/20/2023    1:52 PM 07/27/2023    8:00 AM 07/24/2022    2:11 PM  Depression screen PHQ 2/9  Decreased Interest 3 3 3   Down, Depressed, Hopeless 2 3 1   PHQ - 2 Score 5 6 4   Altered sleeping 3 2 3   Tired, decreased energy 2 3 3   Change in appetite 3 2 3   Feeling bad or failure about yourself  2 1 0  Trouble concentrating 2 2 0  Moving slowly or fidgety/restless 3 2 0  Suicidal thoughts 0 1 0  PHQ-9 Score 20 19 13   Difficult doing work/chores Very difficult      Failed to redirect to the Timeline version of the REVFS SmartLink. Flowsheet Row ED from 11/08/2020 in [facility] Admission (Discharged) [facility]  C-SSRS RISK CATEGORY High Risk No Risk         Collaboration of Care: none  Patient/Guardian was advised Release of Information must be obtained prior to any record release in order to collaborate their care with an outside provider. Patient/Guardian was advised if they have  not already done so to contact the registration department to sign all necessary forms in order for [place] to release information regarding their care.   Consent: Patient/Guardian gives verbal consent for treatment and assignment of benefits for services provided during this visit. Patient/Guardian expressed understanding and agreed to proceed.   Plan:  Continue in office exposure therapy, addressing compulsive behaviors (handwashing) as well as avoidance behaviors (not handling trash) Address cognitive distortions surrounding self worth and familial conflict Continue to work on reality testing regarding possible delusional thoughts  Carlyn Reichert, MD 09/15/2023

## 2023-09-17 ENCOUNTER — Ambulatory Visit (INDEPENDENT_AMBULATORY_CARE_PROVIDER_SITE_OTHER): Payer: MEDICAID | Admitting: *Deleted

## 2023-09-17 ENCOUNTER — Encounter (HOSPITAL_COMMUNITY): Payer: Self-pay

## 2023-09-17 VITALS — BP 116/69 | HR 97 | Resp 16 | Ht 61.0 in | Wt 183.4 lb

## 2023-09-17 DIAGNOSIS — F200 Paranoid schizophrenia: Secondary | ICD-10-CM | POA: Diagnosis not present

## 2023-09-17 NOTE — Progress Notes (Signed)
Patient arrived with her mother for her injection of Abilify Asimtufii 960mg . Injection was a one time order due to patient going to [place] and will not be here for her monthly injection. Therefore she was ordered a 2 month injection so she can enjoy her vacation. Sample was given as patient did not bring medication.Patient has brighter affect and smiling with appropriate mood. Injection given in Right [place] Outer Quadrant. No issues or complaints. Will return in 2 months.

## 2023-09-24 ENCOUNTER — Other Ambulatory Visit (HOSPITAL_COMMUNITY): Payer: Self-pay | Admitting: Student

## 2023-09-24 ENCOUNTER — Telehealth (HOSPITAL_COMMUNITY): Payer: Self-pay | Admitting: Student

## 2023-09-24 ENCOUNTER — Other Ambulatory Visit (HOSPITAL_COMMUNITY): Payer: Self-pay

## 2023-09-24 DIAGNOSIS — F422 Mixed obsessional thoughts and acts: Secondary | ICD-10-CM

## 2023-09-24 DIAGNOSIS — F203 Undifferentiated schizophrenia: Secondary | ICD-10-CM

## 2023-09-24 MED ORDER — FLUOXETINE HCL 40 MG PO CAPS: 40.0000 mg | ORAL_CAPSULE | Freq: Every day | ORAL | 2 refills | Status: DC

## 2023-09-24 MED ORDER — FLUOXETINE HCL 20 MG PO CAPS: 20.0000 mg | ORAL_CAPSULE | Freq: Every day | ORAL | 3 refills | Status: DC

## 2023-09-25 ENCOUNTER — Other Ambulatory Visit: Payer: Self-pay

## 2023-09-26 ENCOUNTER — Ambulatory Visit (INDEPENDENT_AMBULATORY_CARE_PROVIDER_SITE_OTHER): Payer: MEDICAID | Admitting: Student

## 2023-09-26 DIAGNOSIS — F429 Obsessive-compulsive disorder, unspecified: Secondary | ICD-10-CM

## 2023-09-27 ENCOUNTER — Other Ambulatory Visit (HOSPITAL_COMMUNITY): Payer: Self-pay

## 2023-09-27 ENCOUNTER — Other Ambulatory Visit (HOSPITAL_COMMUNITY): Payer: Self-pay | Admitting: Psychiatry

## 2023-09-27 DIAGNOSIS — F203 Undifferentiated schizophrenia: Secondary | ICD-10-CM

## 2023-09-27 DIAGNOSIS — F422 Mixed obsessional thoughts and acts: Secondary | ICD-10-CM

## 2023-09-27 NOTE — Progress Notes (Signed)
[facility] [address] [place] [address] Dept: [phone] Dept Fax: [phone]  Psychotherapy Progress Note  Patient ID: Eileen Rivas, female  DOB: 05-Apr-2001, 22 y.o.  MRN: [id number]  09/26/2023 Start time: 2:10 PM End time: 2:50 PM  Method of Visit: Face-to-Face  Present: patient, provider  Current Concerns: having difficulty with taking out the trash  Current Symptoms: Compulsive behaviors, avoidance behaviors  Psychiatric Specialty Exam:     General Appearance: Fairly Groomed  Eye Contact:  Good  Speech:  Clear and Coherent  Volume:  Normal  Mood:  "okay"  Affect:  constricted  Thought Process:  Coherent  Orientation:  Full (Time, Place, and Person)  Thought Content: Logical   Suicidal Thoughts:  No  Homicidal Thoughts:  No  Memory:  Immediate;   Good  Judgement:  fair  Insight:  poor  Psychomotor Activity:  Normal  Concentration:  Concentration: Good  Recall:  Good  Fund of Knowledge: Good  Language: Good  Akathisia:  No  Handed:  not assessed  AIMS (if indicated): not done  Assets:  Communication Skills Desire for Improvement Financial Resources/Insurance Housing Leisure Time Physical Health  ADL's:  Intact  Cognition: WNL  Sleep:  Fair    Diagnosis: Obsessive-compulsive disorder   Anticipated Frequency of Visits: Once weekly Anticipated Length of Treatment Episode: To be determined  Short Term Goals/Goals for Treatment Session: In office exposure therapy completed which consisted of the patient handling trash and practicing short handwashing.   Progress Towards Goals: Initial  Treatment Intervention: Exposure response prevention   09/27/23 1411  Anxiety Long Term Goals  Anxiety Long Term Goals Stabilize anxiety level while increasing ability to function on a daily basis  Anxiety Short Term Goals  Anxiety Short Term Goals Identify an anxiety coping mechanism that has been  successful in the past and increase its use  Acknowledge irrational nature of the fears Assist the client in developing an awareness of the irrational nature of his/her fears  Anxiety Treatment Plan Status  Anxiety Treatment Plan Status Progressing, as evidenced by patient being able to name a coping strategy to help with the anxiety that comes with taking the trash out, as well as the patient responding positively to "weighing the evidence" approaches, and working on improving daily functioning by taking out the trash every other day.       Medical Necessity: Improved patient condition  Assessment Tools:    08/20/2023    1:52 PM 07/27/2023    8:00 AM 07/24/2022    2:11 PM  Depression screen PHQ 2/9  Decreased Interest 3 3 3   Down, Depressed, Hopeless 2 3 1   PHQ - 2 Score 5 6 4   Altered sleeping 3 2 3   Tired, decreased energy 2 3 3   Change in appetite 3 2 3   Feeling bad or failure about yourself  2 1 0  Trouble concentrating 2 2 0  Moving slowly or fidgety/restless 3 2 0  Suicidal thoughts 0 1 0  PHQ-9 Score 20 19 13   Difficult doing work/chores Very difficult      Failed to redirect to the Timeline version of the REVFS SmartLink. Flowsheet Row ED from 11/08/2020 in [facility] Ad[facility] (Discharged[facility]  C-SSRS RISK CATEGORY High Risk No Risk         Collaboration of Care: none  Patient/Guardian was advised Release of Information must be obtained prior to any  record release in order to collaborate their care with an outside provider. Patient/Guardian was advised if they have not already done so to contact the registration department to sign all necessary forms in order for [place] to release information regarding their care.   Consent: Patient/Guardian gives verbal consent for treatment and assignment of benefits for services provided during this visit. Patient/Guardian expressed  understanding and agreed to proceed.   Plan:  Continue in office exposure therapy, addressing compulsive behaviors (handwashing) as well as avoidance behaviors (not handling trash) Address cognitive distortions surrounding self worth and familial conflict Continue to work on reality testing regarding possible delusional thoughts  Carlyn Reichert, MD 09/27/2023

## 2023-09-30 ENCOUNTER — Other Ambulatory Visit: Payer: Self-pay

## 2023-11-07 ENCOUNTER — Encounter (HOSPITAL_COMMUNITY): Payer: Self-pay

## 2023-11-07 ENCOUNTER — Ambulatory Visit (HOSPITAL_COMMUNITY): Payer: No Payment, Other

## 2023-11-08 ENCOUNTER — Ambulatory Visit (HOSPITAL_COMMUNITY): Payer: MEDICAID | Admitting: Student

## 2023-11-08 ENCOUNTER — Other Ambulatory Visit (HOSPITAL_COMMUNITY): Payer: Self-pay | Admitting: Student

## 2023-11-08 DIAGNOSIS — F200 Paranoid schizophrenia: Secondary | ICD-10-CM

## 2023-11-08 MED ORDER — ARIPIPRAZOLE ER 400 MG IM PRSY
400.0000 mg | PREFILLED_SYRINGE | INTRAMUSCULAR | Status: DC
  Administered 2023-11-22 – 2024-01-30 (×3): 400 mg via INTRAMUSCULAR

## 2023-11-08 NOTE — Progress Notes (Signed)
Called the patient to discuss no-show at today's appointment.  The patient's mother states that she works every Friday and is unable to transport the patient on Fridays.  We discussed that we can do virtual therapy appointments on Fridays, until we can schedule the patient for in person appointments on Tuesdays (when the patient's mother is off work).  It seems that the patient sleeps until the early afternoon so the patient's sister will need to wake her up for appointments on Friday mornings.   Patient scheduled for LAI appointment on 1/23.  The patient last received Abilify Asimtufii on 11/26.  Abilify Maintena ordered to resume her regular medication regimen, although should Abilify Asimtufii be an option for her it may be a better long-term medication.   Carlyn Reichert, MD PGY-3

## 2023-11-11 ENCOUNTER — Other Ambulatory Visit (HOSPITAL_COMMUNITY): Payer: Self-pay

## 2023-11-11 ENCOUNTER — Other Ambulatory Visit: Payer: Self-pay

## 2023-11-11 MED ORDER — ABILIFY MAINTENA 400 MG IM PRSY
400.0000 mg | PREFILLED_SYRINGE | INTRAMUSCULAR | 11 refills | Status: DC
  Filled 2023-11-11: qty 1, 28d supply, fill #0
  Filled 2023-12-03: qty 1, 28d supply, fill #1
  Filled 2024-01-29 – 2024-01-30 (×2): qty 1, 28d supply, fill #2

## 2023-11-11 NOTE — Progress Notes (Signed)
Specialty Pharmacy Refill Coordination Note  Eileen Rivas is a 23 y.o. female contacted today regarding refills of specialty medication(s) ARIPiprazole (Abilify Maintena)   Patient requested Courier to Provider Office   Delivery date: 11/13/23   Verified address: [address], [place], [address]   Medication will be filled on 11/12/23.

## 2023-11-12 ENCOUNTER — Encounter (HOSPITAL_COMMUNITY): Payer: No Payment, Other | Admitting: Student

## 2023-11-12 ENCOUNTER — Other Ambulatory Visit: Payer: Self-pay

## 2023-11-12 ENCOUNTER — Ambulatory Visit (HOSPITAL_COMMUNITY): Payer: No Payment, Other | Admitting: Student

## 2023-11-14 ENCOUNTER — Ambulatory Visit (HOSPITAL_COMMUNITY): Payer: MEDICAID

## 2023-11-22 ENCOUNTER — Ambulatory Visit (INDEPENDENT_AMBULATORY_CARE_PROVIDER_SITE_OTHER): Payer: MEDICAID

## 2023-11-22 ENCOUNTER — Ambulatory Visit (INDEPENDENT_AMBULATORY_CARE_PROVIDER_SITE_OTHER): Payer: MEDICAID | Admitting: Student

## 2023-11-22 VITALS — BP 104/71 | HR 68 | Temp 97.9°F | Resp 15 | Ht 61.0 in | Wt 182.1 lb

## 2023-11-22 DIAGNOSIS — F422 Mixed obsessional thoughts and acts: Secondary | ICD-10-CM | POA: Diagnosis not present

## 2023-11-22 DIAGNOSIS — F200 Paranoid schizophrenia: Secondary | ICD-10-CM

## 2023-11-22 DIAGNOSIS — F203 Undifferentiated schizophrenia: Secondary | ICD-10-CM

## 2023-11-22 DIAGNOSIS — F429 Obsessive-compulsive disorder, unspecified: Secondary | ICD-10-CM | POA: Diagnosis not present

## 2023-11-22 MED ORDER — FLUOXETINE HCL 40 MG PO CAPS: 40.0000 mg | ORAL_CAPSULE | Freq: Every day | ORAL | 2 refills | Status: DC

## 2023-11-22 MED ORDER — FLUOXETINE HCL 20 MG PO CAPS: 20.0000 mg | ORAL_CAPSULE | Freq: Every day | ORAL | 3 refills | Status: DC

## 2023-11-22 NOTE — Progress Notes (Signed)
 Pt presents today for injection of Abilify Maintenna which was tolerated well in left deltoid. Pt states she has no problems with injections her self minus the slight discomfort it leaves in arms when sustaining the shot. Told PT to use warm compress or rag. Pt did mention that she hears voices not commanding her to do things more so " the devil telling her to do certain or commit certain things". Told PT that journaling these voices may better help resolve the confusion.

## 2023-11-22 NOTE — Progress Notes (Signed)
BH MD Outpatient Progress Note  11/22/2023 9:49 AM Eileen Rivas  MRN:  [id number]  Assessment:  Eileen Rivas presents for follow-up evaluation. Psychiatric symptomatology remains stable after extended trip out of the country. Frustrating symptoms do remain: fears of contamination that prevent taking out the trash and result in excessive handwashing, as well as fears about "the mark of the beast". Her thought process remains linear and logical and she is appropriate for outpatient care. She missed her Abilify LAI appointment a few days ago so this will be given today. We would like to add a second antipsychotic to her regimen but this is not possible until the patient has gotten an EKG with a PCP. Information given again and mother agrees to follow up this time.   Eileen Rivas is a 23 y.o. y.o. female with a history of schizophrenia and OCD who is an established patient with [facility] for management of schizophrenia and obsessive thoughts and compulsive behaviors.  It is worth noting that the patient resides with her mother Eileen Rivas), who is the patient's main social support.  The patient was hospitalized in 2021 at the behavioral hospital for obsessive religious thoughts and was given the diagnosis of OCD.  Plan:  # Obsessive-compulsive disorder Interventions: --Continue Prozac at 60 mg daily - Continue psychotherapy with this provider  # Schizophrenia Interventions: - Abilify maintena 400 mg q28d -- Prev hyperprolactinemia on risperidone LAI  #Long term use of antipsychotic medication -- Lipid panel from 05/28/2023, LDL 82, triglycerides 623 - A1c from 05/28/2023, normal - No EKG, will need to message the patient's PCP once she has a PCP - AIMS of 0 on my assessment Nov 2024  #Elevated TSH, Hypertriglyceridemia - Needs to follow-up with internal medicine clinic or other primary care physician - They expressed their intention to call and set up  appointment   Patient was given contact information for behavioral health clinic and was instructed to call 911 for emergencies.   Subjective:  Chief Complaint:  Chief Complaint  Patient presents with   Follow-up    Interval History:  Patient exhibits a linear and logical thought process. Responds well to questions. Reports depressed mood but denies SI or hopelessness. Reports continued support from female friend/partner. Reports using strategies for reducing handwashing but these have obviously had very limited efficacy. Denies AH, but does report "thinking about the devil sometimes" which bothers her. Denies paranoia.    Visit Diagnosis:    ICD-10-CM   1. Undifferentiated schizophrenia (HCC)  F20.3 FLUoxetine (PROZAC) 40 MG capsule    2. Mixed obsessional thoughts and acts  F42.2 FLUoxetine (PROZAC) 40 MG capsule    FLUoxetine (PROZAC) 20 MG capsule        Past Psychiatric History: As above  Past Medical History:  Past Medical History:  Diagnosis Date   Anxiety    Depression    Obsessive-compulsive disorder    No past surgical history on file.  Family Psychiatric History: None pertinent  Family History: No family history on file.  Social History:  Social History   Socioeconomic History   Marital status: Single    Spouse name: Not on file   Number of children: 0   Years of education: Not on file   Highest education level: Not on file  Occupational History   Occupation: student    Comment: [employer]  Tobacco Use   Smoking status: Never   Smokeless tobacco: Never  Vaping Use   Vaping status: Never Used  Substance and Sexual Activity   Alcohol use: Yes    Comment: occassional   Drug use: No   Sexual activity: Never  Other Topics Concern   Not on file  Social History Narrative   Not on file   Social Drivers of Health   Financial Resource Strain: Low Risk  (07/24/2022)   Overall Financial Resource Strain (CARDIA)    Difficulty of Paying Living  Expenses: Not hard at all  Food Insecurity: Not on File (07/18/2023)   Received from [facility] Insecurity    Food: 0  Transportation Needs: No Transportation Needs (07/24/2022)   PRAPARE - Administrator, Civil Service (Medical): No    Lack of Transportation (Non-Medical): No  Physical Activity: Inactive (07/24/2022)   Exercise Vital Sign    Days of Exercise per Week: 0 days    Minutes of Exercise per Session: 0 min  Stress: Stress Concern Present (07/24/2022)   [employer] of Occupational Health - Occupational Stress Questionnaire    Feeling of Stress : To some extent  Social Connections: Not on File (07/16/2023)   Received from [facility]   Social Connections    Connectedness: 0    Allergies: No Known Allergies  Current Medications: Current Outpatient Medications  Medication Sig Dispense Refill   ARIPiprazole ER (ABILIFY MAINTENA) 400 MG PRSY prefilled syringe Inject 400 mg into the muscle every 28 (twenty-eight) days. 1 each 11   FLUoxetine (PROZAC) 20 MG capsule Take 1 capsule (20 mg total) by mouth daily. 30 capsule 3   FLUoxetine (PROZAC) 40 MG capsule Take 1 capsule (40 mg total) by mouth daily. Take with 20mg . 30 capsule 2   Current Facility-Administered Medications  Medication Dose Route Frequency Provider Last Rate Last Admin   ARIPiprazole ER (ABILIFY MAINTENA) 400 MG prefilled syringe 400 mg  400 mg Intramuscular Q28 days          Objective:  Psychiatric Specialty Exam: Physical Exam Constitutional:      Appearance: the patient is not toxic-appearing.  Pulmonary:     Effort: Pulmonary effort is normal.  Neurological:     General: No focal deficit present.     Mental Status: the patient is alert and oriented to person, place, and time.   Review of Systems  Respiratory:  Negative for shortness of breath.   Cardiovascular:  Negative for chest pain.  Gastrointestinal:  Negative for abdominal pain, constipation, diarrhea, nausea and vomiting.   Neurological:  Negative for headaches.      There were no vitals taken for this visit.  General Appearance: Fairly Groomed  Eye Contact:  Good  Speech:  Clear and Coherent  Volume:  Normal  Mood:  "depressed"  Affect:  flat  Thought Process:  Coherent  Orientation:  Full (Time, Place, and Person)  Thought Content: Logical during interview, but does report preoccupation with fear of contamination and "the mark of the beast"  Suicidal Thoughts:  No  Homicidal Thoughts:  No  Memory:  Immediate;   Good  Judgement:  fair  Insight:  fair  Psychomotor Activity:  Normal  Concentration:  Concentration: Good  Recall:  Good  Fund of Knowledge: Good  Language: Good  Akathisia:  No  Handed:    AIMS (if indicated): not done  Assets:  Communication Skills Desire for Improvement Financial Resources/Insurance Housing Leisure Time Physical Health  ADL's:  Intact  Cognition: WNL  Sleep:  Fair     Metabolic Disorder Labs: Lab Results  Component Value  Date   HGBA1C 5.5 05/28/2023   Lab Results  Component Value Date   PROLACTIN 16.5 05/28/2023   Lab Results  Component Value Date   CHOL 219 (H) 05/28/2023   TRIG 623 (HH) 05/28/2023   HDL 37 (L) 05/28/2023   CHOLHDL 5.9 (H) 05/28/2023   LDLCALC 82 05/28/2023   Lab Results  Component Value Date   TSH 5.190 (H) 05/28/2023   TSH 10.615 (H) 05/02/2020    Therapeutic Level Labs: No results found for: "LITHIUM" No results found for: "VALPROATE" No results found for: "CBMZ"  Screenings: AIMS    Flowsheet Row Clinical Support from 09/25/2022 in [facility] Clinical Support from 08/08/2022 in [facility] Office Visit from 07/24/2022 in [facility] Admission (Discharged) [facility]  AIMS Total Score 0 1 2 0      AUDIT    Flowsheet Row Ad[facility] (Discharged[facility]  Alcohol Use Disorder Identification Test Final Score (AUDIT) 0      GAD-7    Flowsheet Row Office Visit from 08/20/2023 in [facility]  Total GAD-7 Score 17      PHQ2-9    Flowsheet Row Office Visit from 08/20/2023 in [facility] Counselor from 07/25/2023 in [facility] Office Visit from 07/24/2022 in [facility] ED from 11/08/2020 in [facility] ED from 05/02/2020 in [facility]  PHQ-2 Total Score 5 6 4 2 3   PHQ-9 Total Score 20 19 13 12 15       Flowsheet Row ED from 11/08/2020 in [facility] Admission (Discharged) from [facility]  C-SSRS RISK CATEGORY High Risk No Risk       Collaboration of Care: none  A total of 30 minutes was spent involved in face to face clinical care, chart review, documentation.   Carlyn Reichert, MD 11/22/2023, 9:49 AM

## 2023-11-22 NOTE — Patient Instructions (Signed)
 Please follow-up with the 2 practices listed below to obtain primary care services:  [facility] [address] [place] [place] [phone]  [facility] [address] [place] [phone]

## 2023-12-03 ENCOUNTER — Other Ambulatory Visit: Payer: Self-pay

## 2023-12-03 NOTE — Progress Notes (Signed)
Specialty Pharmacy Refill Coordination Note  Eileen Rivas is a 23 y.o. female contacted today regarding refills of specialty medication(s) ARIPiprazole (Abilify Maintena) Spoke with patient's mother  Patient requested Courier to Provider Office   Delivery date: 12/17/23   Verified address: [address], [place], [address]   Medication will be filled on 02.24.25.

## 2023-12-06 ENCOUNTER — Ambulatory Visit (INDEPENDENT_AMBULATORY_CARE_PROVIDER_SITE_OTHER): Payer: MEDICAID | Admitting: Student

## 2023-12-06 DIAGNOSIS — F429 Obsessive-compulsive disorder, unspecified: Secondary | ICD-10-CM | POA: Diagnosis not present

## 2023-12-06 NOTE — Progress Notes (Signed)
[facility] [address] [place] [address] Dept: [phone] Dept Fax: [phone]  Psychotherapy Progress Note  Patient ID: Eileen Rivas, female  DOB: 2001/03/05, 23 y.o.  MRN: [id number]  12/06/2023 Start time: 8 AM End time: 8:45 AM  Televisit via video: I connected with [name] on 12/06/2023 at  8:00 AM EST by a video enabled telemedicine application and verified that I am speaking with the correct person using two identifiers.  Location: Patient: home Provider: office   I discussed the limitations of evaluation and management by telemedicine and the availability of in person appointments. The patient expressed understanding and agreed to proceed.  I discussed the assessment and treatment plan with the patient. The patient was provided an opportunity to ask questions and all were answered. The patient agreed with the plan and demonstrated an understanding of the instructions.   The patient was advised to call back or seek an in-person evaluation if the symptoms worsen or if the condition fails to improve as anticipated.   Method of Visit: Virtual  Present: patient, provider  Current Concerns: excessive handwashing  Current Symptoms: Compulsive behaviors, avoidance behaviors  Psychiatric Specialty Exam:     General Appearance: Fairly Groomed  Eye Contact:  Good  Speech:  Clear and Coherent  Volume:  Normal  Mood:  "okay"  Affect:  constricted  Thought Process:  Coherent  Orientation:  Full (Time, Place, and Person)  Thought Content: Logical   Suicidal Thoughts:  No  Homicidal Thoughts:  No  Memory:  Immediate;   Good  Judgement:  fair  Insight:  poor  Psychomotor Activity:  Normal  Concentration:  Concentration: Good  Recall:  Good  Fund of Knowledge: Good  Language: Good  Akathisia:  No  Handed:  not assessed  AIMS (if indicated): not done  Assets:  Communication Skills Desire for  Improvement Financial Resources/Insurance Housing Leisure Time Physical Health  ADL's:  Intact  Cognition: WNL  Sleep:  Fair    Diagnosis: Obsessive-compulsive disorder   Anticipated Frequency of Visits: Once weekly Anticipated Length of Treatment Episode: To be determined  Short Term Goals/Goals for Treatment Session: Patient was able to engage in at home exposure therapy. We continued to work on identifying irrational thoughts and replacing them with more reasonable risk estimations.   Progress Towards Goals: Initial  Treatment Intervention: Exposure response prevention   09/27/23 1411  Anxiety Long Term Goals  Anxiety Long Term Goals Stabilize anxiety level while increasing ability to function on a daily basis  Anxiety Short Term Goals  Anxiety Short Term Goals Identify an anxiety coping mechanism that has been successful in the past and increase its use  Acknowledge irrational nature of the fears Assist the client in developing an awareness of the irrational nature of his/her fears  Anxiety Treatment Plan Status  Anxiety Treatment Plan Status Progressing, as evidenced by patient being able to name a coping strategy to help with the anxiety that comes with taking the trash out, as well as the patient responding positively to "weighing the evidence" approaches,.       Medical Necessity: Improved patient condition  Assessment Tools:    08/20/2023    1:52 PM 07/27/2023    8:00 AM 07/24/2022    2:11 PM  Depression screen PHQ 2/9  Decreased Interest 3 3 3   Down, Depressed, Hopeless 2 3 1   PHQ - 2 Score 5 6 4   Altered sleeping 3 2 3   Tired, decreased energy  2 3 3   Change in appetite 3 2 3   Feeling bad or failure about yourself  2 1 0  Trouble concentrating 2 2 0  Moving slowly or fidgety/restless 3 2 0  Suicidal thoughts 0 1 0  PHQ-9 Score 20 19 13   Difficult doing work/chores Very difficult      Failed to redirect to the Timeline version of the REVFS  SmartLink. Flowsheet Row ED from 11/08/2020 in [facility] Admission (Discharged) from [facility]  C-SSRS RISK CATEGORY High Risk No Risk         Collaboration of Care: none  Patient/Guardian was advised Release of Information must be obtained prior to any record release in order to collaborate their care with an outside provider. Patient/Guardian was advised if they have not already done so to contact the registration department to sign all necessary forms in order for [place] to release information regarding their care.   Consent: Patient/Guardian gives verbal consent for treatment and assignment of benefits for services provided during this visit. Patient/Guardian expressed understanding and agreed to proceed.   Plan:  Continue exposure therapy, addressing compulsive behaviors (handwashing) as well as avoidance behaviors (not handling trash) Address cognitive distortions surrounding self worth and familial conflict Continue to work on reality testing regarding possible delusional thoughts  Carlyn Reichert, MD 12/06/2023

## 2023-12-12 ENCOUNTER — Ambulatory Visit (INDEPENDENT_AMBULATORY_CARE_PROVIDER_SITE_OTHER): Payer: MEDICAID | Admitting: Student

## 2023-12-12 DIAGNOSIS — F429 Obsessive-compulsive disorder, unspecified: Secondary | ICD-10-CM

## 2023-12-12 NOTE — Progress Notes (Signed)
[facility] [address] [place] [address] Dept: [phone] Dept Fax: [phone]  Psychotherapy Progress Note  Patient ID: Eileen Rivas, female  DOB: 01/29/2001, 23 y.o.  MRN: [id number]  12/12/2023 Start time: 9 AM End time: 9:45 AM  Televisit via video: I connected with Eileen Rivas on 12/11/2022 at  9:00 AM EST by a video enabled telemedicine application and verified that I am speaking with the correct person using two identifiers.  Location: Patient: home Provider: office   I discussed the limitations of evaluation and management by telemedicine and the availability of in person appointments. The patient expressed understanding and agreed to proceed.  I discussed the assessment and treatment plan with the patient. The patient was provided an opportunity to ask questions and all were answered. The patient agreed with the plan and demonstrated an understanding of the instructions.   The patient was advised to call back or seek an in-person evaluation if the symptoms worsen or if the condition fails to improve as anticipated.   Method of Visit: Virtual  Present: patient, provider  Current Concerns: excessive handwashing, inability to perform daily tasks  Current Symptoms: Compulsive behaviors, avoidance behaviors  Psychiatric Specialty Exam:     General Appearance: Fairly Groomed  Eye Contact:  Good  Speech:  Clear and Coherent  Volume:  Normal  Mood:  "okay"  Affect:  constricted  Thought Process:  Coherent  Orientation:  Full (Time, Place, and Person)  Thought Content: Logical   Suicidal Thoughts:  No  Homicidal Thoughts:  No  Memory:  Immediate;   Good  Judgement:  fair  Insight:  poor  Psychomotor Activity:  Normal  Concentration:  Concentration: Good  Recall:  Good  Fund of Knowledge: Good  Language: Good  Akathisia:  No  Handed:  not assessed  AIMS (if indicated): not done  Assets:   Communication Skills Desire for Improvement Financial Resources/Insurance Housing Leisure Time Physical Health  ADL's:  Intact  Cognition: WNL  Sleep:  Fair    Diagnosis: Obsessive-compulsive disorder   Anticipated Frequency of Visits: Once weekly Anticipated Length of Treatment Episode: To be determined  Short Term Goals/Goals for Treatment Session: The patient was able to engage in home exposure therapy by washing dishes using a specified mantra to reorient herself during the process.  Progress Towards Goals: Initial  Treatment Intervention: Exposure response prevention   09/27/23 1411  Anxiety Long Term Goals  Anxiety Long Term Goals Stabilize anxiety level while increasing ability to function on a daily basis  Anxiety Short Term Goals  Anxiety Short Term Goals Identify an anxiety coping mechanism that has been successful in the past and increase its use  Acknowledge irrational nature of the fears Assist the client in developing an awareness of the irrational nature of his/her fears  Anxiety Treatment Plan Status  Anxiety Treatment Plan Status Progressing, as evidenced by patient being able to name a coping strategy to help with the anxiety that comes with taking the trash out, as well as the patient responding positively to "weighing the evidence" approaches,.       Medical Necessity: Improved patient condition  Assessment Tools:    08/20/2023    1:52 PM 07/27/2023    8:00 AM 07/24/2022    2:11 PM  Depression screen PHQ 2/9  Decreased Interest 3 3 3   Down, Depressed, Hopeless 2 3 1   PHQ - 2 Score 5 6 4   Altered sleeping 3 2 3   Tired, decreased  energy 2 3 3   Change in appetite 3 2 3   Feeling bad or failure about yourself  2 1 0  Trouble concentrating 2 2 0  Moving slowly or fidgety/restless 3 2 0  Suicidal thoughts 0 1 0  PHQ-9 Score 20 19 13   Difficult doing work/chores Very difficult      Failed to redirect to the Timeline version of the REVFS  SmartLink. Flowsheet Row ED from 11/08/2020 in [facility] (Discharged[facility]  C-SSRS RISK CATEGORY High Risk No Risk         Collaboration of Care: none  Patient/Guardian was advised Release of Information must be obtained prior to any record release in order to collaborate their care with an outside provider. Patient/Guardian was advised if they have not already done so to contact the registration department to sign all necessary forms in order for [place] to release information regarding their care.   Consent: Patient/Guardian gives verbal consent for treatment and assignment of benefits for services provided during this visit. Patient/Guardian expressed understanding and agreed to proceed.   Plan:  Continue exposure therapy, addressing compulsive behaviors (handwashing) as well as avoidance behaviors (not handling trash) Address cognitive distortions surrounding self worth and familial conflict Continue to work on reality testing regarding possible delusional thoughts  Carlyn Reichert, MD 12/12/2023

## 2023-12-16 ENCOUNTER — Other Ambulatory Visit: Payer: Self-pay

## 2023-12-19 ENCOUNTER — Ambulatory Visit (INDEPENDENT_AMBULATORY_CARE_PROVIDER_SITE_OTHER): Payer: MEDICAID | Admitting: Student

## 2023-12-19 DIAGNOSIS — F429 Obsessive-compulsive disorder, unspecified: Secondary | ICD-10-CM | POA: Diagnosis not present

## 2023-12-19 NOTE — Progress Notes (Cosign Needed Addendum)
 [facility] [address] [place] [address] Dept: [phone] Dept Fax: [phone]  Psychotherapy Progress Note  Patient ID: Eileen Rivas, female  DOB: May 24, 2001, 23 y.o.  MRN: [id number]  12/19/2023 Start time: 9 AM End time: 9:45 AM  Televisit via video: I connected with Eileen Rivas on 12/19/2023 at  9:00 AM EST by a video enabled telemedicine application and verified that I am speaking with the correct person using two identifiers.  Location: Patient: home Provider: office   I discussed the limitations of evaluation and management by telemedicine and the availability of in person appointments. The patient expressed understanding and agreed to proceed.  I discussed the assessment and treatment plan with the patient. The patient was provided an opportunity to ask questions and all were answered. The patient agreed with the plan and demonstrated an understanding of the instructions.   The patient was advised to call back or seek an in-person evaluation if the symptoms worsen or if the condition fails to improve as anticipated.   Method of Visit: Virtual  Present: patient, provider  Current Concerns: excessive handwashing, inability to perform daily tasks  Current Symptoms: Compulsive behaviors, avoidance behaviors  Psychiatric Specialty Exam:     General Appearance: Fairly Groomed  Eye Contact:  Good  Speech:  Clear and Coherent  Volume:  Normal  Mood:  "okay"  Affect:  constricted  Thought Process:  Coherent  Orientation:  Full (Time, Place, and Person)  Thought Content: Logical   Suicidal Thoughts:  No  Homicidal Thoughts:  No  Memory:  Immediate;   Good  Judgement:  fair  Insight:  poor  Psychomotor Activity:  Normal  Concentration:  Concentration: Good  Recall:  Good  Fund of Knowledge: Good  Language: Good  Akathisia:  No  Handed:  not assessed  AIMS (if indicated): not done  Assets:   Communication Skills Desire for Improvement Financial Resources/Insurance Housing Leisure Time Physical Health  ADL's:  Intact  Cognition: WNL  Sleep:  Fair    Diagnosis: Obsessive-compulsive disorder   Anticipated Frequency of Visits: Once weekly Anticipated Length of Treatment Episode: To be determined  Short Term Goals/Goals for Treatment Session: The patient was able to discuss erroneous beliefs that caused her to be afraid of performing particular chores.  She was able to engage in a process of weighing the evidence.  She was able to plan that she will perform a simple chore 1 time this week.  Progress Towards Goals: Initial  Treatment Intervention: Exposure response prevention   09/27/23 1411  Anxiety Long Term Goals  Anxiety Long Term Goals Stabilize anxiety level while increasing ability to function on a daily basis  Anxiety Short Term Goals  Anxiety Short Term Goals Identify an anxiety coping mechanism that has been successful in the past and increase its use  Acknowledge irrational nature of the fears Assist the client in developing an awareness of the irrational nature of his/her fears  Anxiety Treatment Plan Status  Anxiety Treatment Plan Status Progressing, as evidenced by patient being able to name a coping strategy to help with the anxiety that comes with daily chores, as well as the patient responding positively to "weighing the evidence" approaches,.       Medical Necessity: Improved patient condition  Assessment Tools:    08/20/2023    1:52 PM 07/27/2023    8:00 AM 07/24/2022    2:11 PM  Depression screen PHQ 2/9  Decreased Interest 3 3 3  Down, Depressed, Hopeless 2 3 1   PHQ - 2 Score 5 6 4   Altered sleeping 3 2 3   Tired, decreased energy 2 3 3   Change in appetite 3 2 3   Feeling bad or failure about yourself  2 1 0  Trouble concentrating 2 2 0  Moving slowly or fidgety/restless 3 2 0  Suicidal thoughts 0 1 0  PHQ-9 Score 20 19 13   Difficult  doing work/chores Very difficult      Failed to redirect to the Timeline version of the REVFS SmartLink. Flowsheet Row ED from 11/08/2020 in [facility] Admission (Discharged) [facility]  C-SSRS RISK CATEGORY High Risk No Risk         Collaboration of Care: none  Patient/Guardian was advised Release of Information must be obtained prior to any record release in order to collaborate their care with an outside provider. Patient/Guardian was advised if they have not already done so to contact the registration department to sign all necessary forms in order for [place] to release information regarding their care.   Consent: Patient/Guardian gives verbal consent for treatment and assignment of benefits for services provided during this visit. Patient/Guardian expressed understanding and agreed to proceed.   Plan:  Continue exposure therapy, addressing compulsive behaviors (handwashing) as well as avoidance behaviors (not handling trash) Address cognitive distortions surrounding self worth and familial conflict Continue to work on reality testing regarding possible delusional thoughts  Carlyn Reichert, MD 12/19/2023

## 2023-12-25 ENCOUNTER — Ambulatory Visit (HOSPITAL_COMMUNITY): Payer: MEDICAID

## 2023-12-26 ENCOUNTER — Ambulatory Visit (INDEPENDENT_AMBULATORY_CARE_PROVIDER_SITE_OTHER): Payer: MEDICAID | Admitting: Student

## 2023-12-26 ENCOUNTER — Ambulatory Visit: Payer: MEDICAID | Admitting: Physician Assistant

## 2023-12-26 DIAGNOSIS — F429 Obsessive-compulsive disorder, unspecified: Secondary | ICD-10-CM

## 2023-12-26 NOTE — Progress Notes (Signed)
 [facility] [address] [place] [address] Dept: [phone] Dept Fax: [phone]  Psychotherapy Progress Note  Patient ID: Eileen Rivas, female  DOB: Aug 18, 2001, 23 y.o.  MRN: [id number]  12/26/2023 Start time: 9 AM End time: 9:45 AM  Method of Visit: In person  Present: patient, provider  Current Concerns: excessive handwashing, inability to perform daily tasks  Current Symptoms: Compulsive behaviors, avoidance behaviors  Psychiatric Specialty Exam:     General Appearance: Fairly Groomed  Eye Contact:  Good  Speech:  Clear and Coherent  Volume:  Normal  Mood:  "okay"  Affect:  constricted  Thought Process:  Coherent  Orientation:  Full (Time, Place, and Person)  Thought Content: Logical   Suicidal Thoughts:  No  Homicidal Thoughts:  No  Memory:  Immediate;   Good  Judgement:  fair  Insight:  poor  Psychomotor Activity:  Normal  Concentration:  Concentration: Good  Recall:  Good  Fund of Knowledge: Good  Language: Good  Akathisia:  No  Handed:  not assessed  AIMS (if indicated): not done  Assets:  Communication Skills Desire for Improvement Financial Resources/Insurance Housing Leisure Time Physical Health  ADL's:  Intact  Cognition: WNL  Sleep:  Fair    Diagnosis: Obsessive-compulsive disorder   Anticipated Frequency of Visits: Once weekly Anticipated Length of Treatment Episode: To be determined  Short Term Goals/Goals for Treatment Session: The patient was able to discuss progress in completing a chore over the past week.  She intends to repeat this success this week and also engage in another goal-directed activity.  We were able to personify a rational third-party that can help her with fixed false beliefs surrounding contracting diseases from performing chores.  Progress Towards Goals: Initial  Treatment Intervention: Exposure response prevention   09/27/23 1411  Anxiety Long Term  Goals  Anxiety Long Term Goals Stabilize anxiety level while increasing ability to function on a daily basis  Anxiety Short Term Goals  Anxiety Short Term Goals Identify an anxiety coping mechanism that has been successful in the past and increase its use  Acknowledge irrational nature of the fears Assist the client in developing an awareness of the irrational nature of his/her fears  Anxiety Treatment Plan Status  Anxiety Treatment Plan Status Progressing, as evidenced by patient being able to name a coping strategy to help with the anxiety that comes with daily chores, as well as the patient responding positively to "weighing the evidence" approaches,.       Medical Necessity: Improved patient condition  Assessment Tools:    08/20/2023    1:52 PM 07/27/2023    8:00 AM 07/24/2022    2:11 PM  Depression screen PHQ 2/9  Decreased Interest 3 3 3   Down, Depressed, Hopeless 2 3 1   PHQ - 2 Score 5 6 4   Altered sleeping 3 2 3   Tired, decreased energy 2 3 3   Change in appetite 3 2 3   Feeling bad or failure about yourself  2 1 0  Trouble concentrating 2 2 0  Moving slowly or fidgety/restless 3 2 0  Suicidal thoughts 0 1 0  PHQ-9 Score 20 19 13   Difficult doing work/chores Very difficult      Failed to redirect to the Timeline version of the REVFS SmartLink. Flowsheet Row ED from 11/08/2020 in [facility] Ad[facility] (Discharged) [facility]  C-SSRS RISK CATEGORY High Risk No Risk  Collaboration of Care: none  Patient/Guardian was advised Release of Information must be obtained prior to any record release in order to collaborate their care with an outside provider. Patient/Guardian was advised if they have not already done so to contact the registration department to sign all necessary forms in order for [place] to release information regarding their care.   Consent: Patient/Guardian gives verbal  consent for treatment and assignment of benefits for services provided during this visit. Patient/Guardian expressed understanding and agreed to proceed.   Plan:  Continue exposure therapy, addressing compulsive behaviors (handwashing) as well as avoidance behaviors (not handling trash) Address cognitive distortions surrounding self worth and familial conflict Continue to work on reality testing regarding possible delusional thoughts  Carlyn Reichert, MD 12/26/2023

## 2023-12-31 ENCOUNTER — Ambulatory Visit
Admission: EM | Admit: 2023-12-31 | Discharge: 2023-12-31 | Disposition: A | Discharge status: Home / Self Care | Payer: MEDICAID | Attending: Family Medicine | Admitting: Family Medicine

## 2023-12-31 ENCOUNTER — Ambulatory Visit
Admission: EM | Admit: 2023-12-31 | Discharge: 2023-12-31 | Disposition: A | Discharge status: Home / Self Care | Payer: MEDICAID

## 2023-12-31 DIAGNOSIS — A084 Viral intestinal infection, unspecified: Secondary | ICD-10-CM

## 2023-12-31 LAB — POCT URINALYSIS DIP (MANUAL ENTRY)
Bilirubin, UA: NEGATIVE
Glucose, UA: NEGATIVE mg/dL
Ketones, POC UA: NEGATIVE mg/dL
Leukocytes, UA: NEGATIVE
Nitrite, UA: NEGATIVE
Protein Ur, POC: 30 mg/dL — AB
Spec Grav, UA: 1.025 (ref 1.010–1.025)
Urobilinogen, UA: 0.2 U/dL
pH, UA: 6 (ref 5.0–8.0)

## 2023-12-31 LAB — POC COVID19/FLU A&B COMBO
Covid Antigen, POC: NEGATIVE
Influenza A Antigen, POC: NEGATIVE
Influenza B Antigen, POC: NEGATIVE

## 2023-12-31 LAB — POCT URINE PREGNANCY: Preg Test, Ur: NEGATIVE

## 2023-12-31 MED ORDER — ONDANSETRON HCL 4 MG PO TABS: 4.0000 mg | ORAL_TABLET | Freq: Three times a day (TID) | ORAL | 0 refills | Status: DC | PRN

## 2023-12-31 MED ORDER — ONDANSETRON 4 MG PO TBDP
4.0000 mg | ORAL_TABLET | Freq: Once | ORAL | Status: AC
  Administered 2023-12-31: 4 mg via ORAL

## 2023-12-31 NOTE — ED Triage Notes (Signed)
"  This started this morning upon waking up with nausea and vomiting (3x today), tolerating electrolyte drink since vomiting, last emesis 1330". "I am noticing some loose stools also". No fever.

## 2023-12-31 NOTE — ED Provider Notes (Signed)
 [facility]    CSN: [id number] Arrival date & time: 12/31/23  1529      History   Chief Complaint Chief Complaint  Patient presents with   Emesis    HPI Eileen Rivas is a 23 y.o. female.   Patient here today for evaluation of persistent vomiting,subjective fever, fatigue, body aches and headache.  Also reports she has had 2 loose stools today.  She is unaware of any known sick contacts.  On arrival she reported nausea and took Zofran and reports at present she is not nauseous.  Vomited at least 3 to 4 x 3 a.m. this morning.  She denies any abdominal pain or dysuria.   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 No pertinent surgical history.  OB History   No obstetric history on file.      Home Medications    Prior to Admission medications   Medication Sig Start Date End Date Taking? Authorizing Provider  ARIPiprazole ER (ABILIFY MAINTENA) 400 MG PRSY prefilled syringe Inject 400 mg into the muscle every 28 (twenty-eight) days. 11/11/23   Carlyn Reichert, MD  FLUoxetine (PROZAC) 40 MG capsule Take 1 capsule (40 mg total) by mouth daily. Take with 20mg . 11/22/23  Yes Carlyn Reichert, MD  FLUoxetine (PROZAC) 20 MG capsule Take 1 capsule (20 mg total) by mouth daily. 11/22/23   Carlyn Reichert, MD    Family History History reviewed. No pertinent family history.  Social History Social History   Tobacco Use   Smoking status: Never    Passive exposure: Never    Smokeless tobacco: Never  Vaping Use   Vaping status: Never Used  Substance Use Topics   Alcohol use: Yes    Comment: occassional   Drug use: No     Allergies   Patient has no known allergies.   Review of Systems Review of Systems  Gastrointestinal:  Positive for vomiting.     Physical Exam Triage Vital Signs ED Triage Vitals  Encounter Vitals Group     BP 12/31/23 1624 107/71     Systolic BP Percentile --      Diastolic BP Percentile --      Pulse Rate 12/31/23 1624 (!) 102     Resp 12/31/23 1624 18     Temp 12/31/23 1624 99 F (37.2 C)     Temp Source 12/31/23 1624 Oral     SpO2 12/31/23 1624 96 %     Weight 12/31/23 1621 180 lb (81.6 kg)     Height 12/31/23 1621 5\' 1"  (1.549 m)     Head Circumference --      Peak Flow --      Pain Score --      Pain Loc --      Pain Education --      Exclude from Growth Chart --    No data found.  Updated Vital Signs BP 107/71 (BP Location: Left Arm)   Pulse 100   Temp 99 F (37.2 C) (  Oral)   Resp 18   Ht 5\' 1"  (1.549 m)   Wt 180 lb (81.6 kg)   LMP 12/13/2023 (Approximate)   SpO2 96%   BMI 34.01 kg/m   Visual Acuity Right Eye Distance:   Left Eye Distance:   Bilateral Distance:    Right Eye Near:   Left Eye Near:    Bilateral Near:     Physical Exam Vitals and nursing note reviewed.  Constitutional:      Appearance: Normal appearance. She is ill-appearing.  HENT:     Head: Normocephalic and atraumatic.     Nose: No congestion or rhinorrhea.     Mouth/Throat:     Pharynx: No oropharyngeal exudate or posterior oropharyngeal erythema.  Eyes:     Extraocular Movements: Extraocular movements intact.     Pupils: Pupils are equal, round, and reactive to light.  Cardiovascular:     Rate and Rhythm: Regular rhythm. Tachycardia present.  Pulmonary:     Effort: Pulmonary effort is normal.     Breath sounds: Normal breath sounds.  Abdominal:     General: Bowel sounds are normal. There is no distension.      Tenderness: There is no abdominal tenderness. There is no right CVA tenderness or left CVA tenderness.  Musculoskeletal:     Cervical back: Normal range of motion and neck supple.  Skin:    General: Skin is warm and dry.  Neurological:     General: No focal deficit present.     Mental Status: She is alert.      UC Treatments / Results  Labs (all labs ordered are listed, but only abnormal results are displayed) Labs Reviewed - No data to display  EKG   Radiology No results found.  Procedures Procedures (including critical care time)  Medications Ordered in UC Medications  ondansetron (ZOFRAN-ODT) disintegrating tablet 4 mg (4 mg Oral Given 12/31/23 1625)    Initial Impression / Assessment and Plan / UC Course  I have reviewed the triage vital signs and the nursing notes.  Pertinent labs & imaging results that were available during my care of the patient were reviewed by me and considered in my medical decision making (see chart for details).    Point-of-care COVID and flu negative.  Urine pregnancy negative.  Patient has a low-grade fever with other viral-like symptoms suspect this is of a viral etiology.  Prescribed Zofran as needed.  Advised that if patient develops fever to take Tylenol.  Encouraged to and small amounts of food to put some form of nutrition on her stomach.  Force fluids.  ER precautions if she develops any severe symptoms involving abdominal pain or projectile vomiting, causing an intolerance of fluids and food.  Patient verbalized understanding and agreement with plan. Final Clinical Impressions(s) / UC Diagnoses   Final diagnoses:  None   Discharge Instructions   None    ED Prescriptions   None    PDMP not reviewed this encounter.   Bing Neighbors, NP 12/31/23 [phone]

## 2023-12-31 NOTE — ED Notes (Signed)
 Pt used BR just prior to being roomed. She has specimen cup and will attempt to obtain urine sample after drinking some water

## 2023-12-31 NOTE — Discharge Instructions (Addendum)
 Repeat dose of Zofran around 9:30 pm if needed for nausea. COVID and Flu negative.  If fever develops take Tylenol every 4 hours as needed.  You have a viral illness which is self limiting will resolve over the next few days. If you develop severe abdominal pain, unable to hold any food or fluids, go to the emergency department.

## 2024-01-01 ENCOUNTER — Ambulatory Visit (HOSPITAL_COMMUNITY): Payer: MEDICAID

## 2024-01-02 ENCOUNTER — Ambulatory Visit (INDEPENDENT_AMBULATORY_CARE_PROVIDER_SITE_OTHER): Payer: MEDICAID

## 2024-01-02 VITALS — BP 97/65 | HR 80 | Ht 61.0 in | Wt 182.0 lb

## 2024-01-02 DIAGNOSIS — F203 Undifferentiated schizophrenia: Secondary | ICD-10-CM

## 2024-01-02 DIAGNOSIS — F200 Paranoid schizophrenia: Secondary | ICD-10-CM

## 2024-01-02 NOTE — Progress Notes (Signed)
 Pt presents today for injection of Abilify maintenna, administered in right deltoid. Pt states that she denies hearing any  SI, and HI. However. Pt does state that she is still hearing voices and things not there, recently specifically a dog that was barking and moving she stated. Yet the dog is not commanding her do perform anything she states.

## 2024-01-03 ENCOUNTER — Other Ambulatory Visit (INDEPENDENT_AMBULATORY_CARE_PROVIDER_SITE_OTHER): Payer: Self-pay | Admitting: Primary Care

## 2024-01-03 ENCOUNTER — Ambulatory Visit (HOSPITAL_COMMUNITY): Payer: MEDICAID | Admitting: Student

## 2024-01-03 DIAGNOSIS — F429 Obsessive-compulsive disorder, unspecified: Secondary | ICD-10-CM

## 2024-01-03 DIAGNOSIS — Z79899 Other long term (current) drug therapy: Secondary | ICD-10-CM

## 2024-01-03 NOTE — Progress Notes (Addendum)
 [facility] [address] [place] [address] Dept: [phone] Dept Fax: [phone]  Psychotherapy Progress Note  Patient ID: Eileen Rivas, female  DOB: [birth year], 23 y.o.  MRN: [id number]  01/03/2024 Start time: 9 AM End time: 9:45 AM  Televisit via video: I connected with Eileen Rivas on 01/03/2024 at  9:00 AM EDT by a video enabled telemedicine application and verified that I am speaking with the correct person using two identifiers.  Location: Patient: Home Provider: Office   I discussed the limitations of evaluation and management by telemedicine and the availability of in person appointments. The patient expressed understanding and agreed to proceed.  I discussed the assessment and treatment plan with the patient. The patient was provided an opportunity to ask questions and all were answered. The patient agreed with the plan and demonstrated an understanding of the instructions.   The patient was advised to call back or seek an in-person evaluation if the symptoms worsen or if the condition fails to improve as anticipated.   Method of Visit: Virtual  Present: patient, provider  Current Concerns: excessive handwashing, inability to perform daily tasks  Current Symptoms: Compulsive behaviors, avoidance behaviors  Psychiatric Specialty Exam:     General Appearance: Fairly Groomed  Eye Contact:  Good  Speech:  Clear and Coherent  Volume:  Normal  Mood:  "okay"  Affect:  constricted  Thought Process:  Coherent  Orientation:  Full (Time, Place, and Person)  Thought Content: Logical   Suicidal Thoughts:  No  Homicidal Thoughts:  No  Memory:  Immediate;   Good  Judgement:  fair  Insight:  poor  Psychomotor Activity:  Normal  Concentration:  Concentration: Good  Recall:  Good  Fund of Knowledge: Good  Language: Good  Akathisia:  No  Handed:  not assessed  AIMS (if indicated): not done  Assets:   Communication Skills Desire for Improvement Financial Resources/Insurance Housing Leisure Time Physical Health  ADL's:  Intact  Cognition: WNL  Sleep:  Fair    Diagnosis: Obsessive-compulsive disorder   Anticipated Frequency of Visits: Once weekly Anticipated Length of Treatment Episode: To be determined  Short Term Goals/Goals for Treatment Session: The patient has made progress in performing daily chores around the house.  She seeks to add an additional chore.  She plans to engage in pleasurable activity a few times this week.  We discussed a distressing intrusive thought, and how the patient can cope with this.  It appears the patient brought this up at the [facility] yesterday.  Progress Towards Goals: Initial  Treatment Intervention: Exposure response prevention   09/27/23 1411  Anxiety Long Term Goals  Anxiety Long Term Goals Stabilize anxiety level while increasing ability to function on a daily basis  Anxiety Short Term Goals  Anxiety Short Term Goals Identify an anxiety coping mechanism that has been successful in the past and increase its use  Acknowledge irrational nature of the fears Assist the client in developing an awareness of the irrational nature of his/her fears  Anxiety Treatment Plan Status  Anxiety Treatment Plan Status Progressing, as evidenced by patient being able to name a coping strategy to help with the anxiety that comes with daily chores, as well as the patient responding positively to "weighing the evidence" approaches,.       Medical Necessity: Improved patient condition  Assessment Tools:    08/20/2023    1:52 PM 07/27/2023    8:00 AM 07/24/2022    2:11 PM  Depression screen PHQ 2/9  Decreased Interest 3 3 3   Down, Depressed, Hopeless 2 3 1   PHQ - 2 Score 5 6 4   Altered sleeping 3 2 3   Tired, decreased energy 2 3 3   Change in appetite 3 2 3   Feeling bad or failure about yourself  2 1 0  Trouble concentrating 2 2 0  Moving slowly or  fidgety/restless 3 2 0  Suicidal thoughts 0 1 0  PHQ-9 Score 20 19 13   Difficult doing work/chores Very difficult      Failed to redirect to the Timeline version of the REVFS SmartLink. Flowsheet Row ED from 12/31/2023 in [facility]) ED from 11/08/2020 in [facility] (Discharged[facility]  C-SSRS RISK CATEGORY Moderate Risk High Risk No Risk         Collaboration of Care: none  Patient/Guardian was advised Release of Information must be obtained prior to any record release in order to collaborate their care with an outside provider. Patient/Guardian was advised if they have not already done so to contact the registration department to sign all necessary forms in order for [place] to release information regarding their care.   Consent: Patient/Guardian gives verbal consent for treatment and assignment of benefits for services provided during this visit. Patient/Guardian expressed understanding and agreed to proceed.   Plan:  Continue exposure therapy, addressing compulsive behaviors (handwashing) as well as avoidance behaviors (not handling trash) Address cognitive distortions surrounding self worth and familial conflict Continue to work on reality testing regarding possible delusional thoughts  Carlyn Reichert, MD 01/03/2024

## 2024-01-03 NOTE — Patient Instructions (Signed)
.  [id number]

## 2024-01-07 ENCOUNTER — Ambulatory Visit (HOSPITAL_COMMUNITY): Payer: MEDICAID | Admitting: Student

## 2024-01-07 DIAGNOSIS — F429 Obsessive-compulsive disorder, unspecified: Secondary | ICD-10-CM

## 2024-01-09 ENCOUNTER — Ambulatory Visit (HOSPITAL_COMMUNITY): Payer: No Payment, Other | Admitting: Student

## 2024-01-09 ENCOUNTER — Other Ambulatory Visit: Payer: Self-pay

## 2024-01-09 NOTE — Progress Notes (Signed)
 [facility] [address] [place] [address] Dept: [phone] Dept Fax: [phone]  Psychotherapy Progress Note  Patient ID: Eileen Rivas, female  DOB: 05-24-2001, 23 y.o.  MRN: [id number]  01/07/2024 Start time: 9:30 AM End time: 10:20 AM  Televisit via video: I connected with Eileen Rivas on 01/07/2024 at  9:30 AM EDT by a video enabled telemedicine application and verified that I am speaking with the correct person using two identifiers.  Location: Patient: Home Provider: Office   I discussed the limitations of evaluation and management by telemedicine and the availability of in person appointments. The patient expressed understanding and agreed to proceed.  I discussed the assessment and treatment plan with the patient. The patient was provided an opportunity to ask questions and all were answered. The patient agreed with the plan and demonstrated an understanding of the instructions.   The patient was advised to call back or seek an in-person evaluation if the symptoms worsen or if the condition fails to improve as anticipated.   Method of Visit: Virtual  Present: patient, provider  Current Concerns: excessive handwashing, inability to perform daily tasks  Current Symptoms: Compulsive behaviors, avoidance behaviors  Psychiatric Specialty Exam:     General Appearance: Fairly Groomed  Eye Contact:  Good  Speech:  Clear and Coherent  Volume:  Normal  Mood:  "okay"  Affect:  constricted  Thought Process:  Coherent  Orientation:  Full (Time, Place, and Person)  Thought Content: Logical   Suicidal Thoughts:  No  Homicidal Thoughts:  No  Memory:  Immediate;   Good  Judgement:  fair  Insight:  poor  Psychomotor Activity:  Normal  Concentration:  Concentration: Good  Recall:  Good  Fund of Knowledge: Good  Language: Good  Akathisia:  No  Handed:  not assessed  AIMS (if indicated): not done  Assets:   Communication Skills Desire for Improvement Financial Resources/Insurance Housing Leisure Time Physical Health  ADL's:  Intact  Cognition: WNL  Sleep:  Fair    Diagnosis: Obsessive-compulsive disorder   Anticipated Frequency of Visits: Once weekly Anticipated Length of Treatment Episode: To be determined  Short Term Goals/Goals for Treatment Session: The patient continues to perform daily chores, which is a significant improvement compared to a month ago.  We discussed irrational beliefs surrounding contamination and severe illness resulting from daily chores.  We will continue to work on probability estimates and consider personification of OCD to help the patient deal with irrational fears.  Progress Towards Goals: Initial  Treatment Intervention: Exposure response prevention   09/27/23 1411  Anxiety Long Term Goals  Anxiety Long Term Goals Stabilize anxiety level while increasing ability to function on a daily basis  Anxiety Short Term Goals  Anxiety Short Term Goals Identify an anxiety coping mechanism that has been successful in the past and increase its use  Acknowledge irrational nature of the fears Assist the client in developing an awareness of the irrational nature of his/her fears  Anxiety Treatment Plan Status  Anxiety Treatment Plan Status Progressing, as evidenced by patient being able to name a coping strategy to help with the anxiety that comes with daily chores, as well as the patient responding positively to "weighing the evidence" approaches,.       Medical Necessity: Improved patient condition  Assessment Tools:    08/20/2023    1:52 PM 07/27/2023    8:00 AM 07/24/2022    2:11 PM  Depression screen PHQ 2/9  Decreased  Interest 3 3 3   Down, Depressed, Hopeless 2 3 1   PHQ - 2 Score 5 6 4   Altered sleeping 3 2 3   Tired, decreased energy 2 3 3   Change in appetite 3 2 3   Feeling bad or failure about yourself  2 1 0  Trouble concentrating 2 2 0  Moving  slowly or fidgety/restless 3 2 0  Suicidal thoughts 0 1 0  PHQ-9 Score 20 19 13   Difficult doing work/chores Very difficult      Failed to redirect to the Timeline version of the REVFS SmartLink. Flowsheet Row ED from 12/31/2023 in [facility]) ED from 11/08/2020 in [facility] Ad[facility] (Discharged[facility]  C-SSRS RISK CATEGORY Moderate Risk High Risk No Risk         Collaboration of Care: none  Patient/Guardian was advised Release of Information must be obtained prior to any record release in order to collaborate their care with an outside provider. Patient/Guardian was advised if they have not already done so to contact the registration department to sign all necessary forms in order for [place] to release information regarding their care.   Consent: Patient/Guardian gives verbal consent for treatment and assignment of benefits for services provided during this visit. Patient/Guardian expressed understanding and agreed to proceed.   Plan:  Continue exposure therapy, addressing compulsive behaviors (handwashing) as well as avoidance behaviors (not handling trash) Address cognitive distortions surrounding self worth and familial conflict Continue to work on reality testing regarding possible delusional thoughts  Carlyn Reichert, MD 01/09/2024

## 2024-01-16 ENCOUNTER — Encounter (HOSPITAL_COMMUNITY): Payer: MEDICAID | Admitting: Student

## 2024-01-17 NOTE — Progress Notes (Signed)
 This encounter was created in error - please disregard.  Unable to get patient on the video call despite multiple attempts.  Will see the patient next week.

## 2024-01-23 ENCOUNTER — Ambulatory Visit (HOSPITAL_COMMUNITY): Payer: MEDICAID | Admitting: Student

## 2024-01-23 DIAGNOSIS — F429 Obsessive-compulsive disorder, unspecified: Secondary | ICD-10-CM | POA: Diagnosis not present

## 2024-01-24 NOTE — Progress Notes (Signed)
 [facility] [address] [place] [address] Dept: [phone] Dept Fax: [phone]  Psychotherapy Progress Note  Patient ID: Valena Ivanov, female  DOB: 08/01/2001, 23 y.o.  MRN: [id number]  01/23/2024 Start time: 10:10 AM End time: 10:50 AM  Method of Visit: In person  Present: patient, provider  Current Concerns: excessive handwashing, inability to perform daily tasks  Current Symptoms: Compulsive behaviors, avoidance behaviors  Psychiatric Specialty Exam:     General Appearance: Fairly Groomed  Eye Contact:  Good  Speech:  Clear and Coherent  Volume:  Normal  Mood:  "okay"  Affect:  constricted  Thought Process:  Coherent  Orientation:  Full (Time, Place, and Person)  Thought Content: Logical   Suicidal Thoughts:  No  Homicidal Thoughts:  No  Memory:  Immediate;   Good  Judgement:  fair  Insight:  poor  Psychomotor Activity:  Normal  Concentration:  Concentration: Good  Recall:  Good  Fund of Knowledge: Good  Language: Good  Akathisia:  No  Handed:  not assessed  AIMS (if indicated): not done  Assets:  Communication Skills Desire for Improvement Financial Resources/Insurance Housing Leisure Time Physical Health  ADL's:  Intact  Cognition: WNL  Sleep:  Fair    Diagnosis: Obsessive-compulsive disorder   Anticipated Frequency of Visits: Once weekly Anticipated Length of Treatment Episode: To be determined  Short Term Goals/Goals for Treatment Session: Engage the patient in a discussion about delusional thoughts that contribute to excessive handwashing and not performing basic daily tasks.  We discussed probability estimates of potentially catastrophic consequences from daily activity, such as getting a severe bacterial infection from taking the garbage out.  The patient clearly overestimates the risk of being harmed by daily tasks.  She was given a template for probability estimation.  An  associated concept that we will further address is "quality of evidence" because the patient often gives undue attention to unverified sources from [facility] or the Internet.  Progress Towards Goals: Initial  Treatment Intervention: Exposure response prevention   09/27/23 1411  Anxiety Long Term Goals  Anxiety Long Term Goals Stabilize anxiety level while increasing ability to function on a daily basis  Anxiety Short Term Goals  Anxiety Short Term Goals Identify an anxiety coping mechanism that has been successful in the past and increase its use  Acknowledge irrational nature of the fears Assist the client in developing an awareness of the irrational nature of his/her fears  Anxiety Treatment Plan Status  Anxiety Treatment Plan Status Progressing, as evidenced by patient being able to name a coping strategy to help with the anxiety that comes with daily chores, as well as the patient responding positively to "weighing the evidence" approaches,.       Medical Necessity: Improved patient condition  Assessment Tools:    08/20/2023    1:52 PM 07/27/2023    8:00 AM 07/24/2022    2:11 PM  Depression screen PHQ 2/9  Decreased Interest 3 3 3   Down, Depressed, Hopeless 2 3 1   PHQ - 2 Score 5 6 4   Altered sleeping 3 2 3   Tired, decreased energy 2 3 3   Change in appetite 3 2 3   Feeling bad or failure about yourself  2 1 0  Trouble concentrating 2 2 0  Moving slowly or fidgety/restless 3 2 0  Suicidal thoughts 0 1 0  PHQ-9 Score 20 19 13   Difficult doing work/chores Very difficult      Failed to redirect to  the Timeline version of the REVFS SmartLink. Flowsheet Row ED from 12/31/2023 in [facility]) ED from 11/08/2020 in [facility]mission (Discharged) [facility]  C-SSRS RISK CATEGORY Moderate Risk High Risk No Risk         Collaboration of Care:  none  Patient/Guardian was advised Release of Information must be obtained prior to any record release in order to collaborate their care with an outside provider. Patient/Guardian was advised if they have not already done so to contact the registration department to sign all necessary forms in order for [place] to release information regarding their care.   Consent: Patient/Guardian gives verbal consent for treatment and assignment of benefits for services provided during this visit. Patient/Guardian expressed understanding and agreed to proceed.   Plan:  Continue exposure therapy, addressing compulsive behaviors (handwashing) as well as avoidance behaviors (not handling trash) Address cognitive distortions surrounding self worth and familial conflict Continue to work on reality testing regarding possible delusional thoughts  Carlyn Reichert, MD 01/24/2024

## 2024-01-29 ENCOUNTER — Telehealth (HOSPITAL_COMMUNITY): Payer: Self-pay | Admitting: *Deleted

## 2024-01-29 ENCOUNTER — Other Ambulatory Visit (HOSPITAL_COMMUNITY): Payer: Self-pay

## 2024-01-29 NOTE — Telephone Encounter (Signed)
 This Clinical research associate called ahead to check if her pharmacy had sent her Abilify Maintena for her injection tomorrow. [facility] pharmacy states that they do not deliver to this facility only mail order to the patients house and she is not set up. Patient has not called for refill of Abilify. They do not have an injection ready for tomorrow. A sample will need to be given if the patient shows up. Message sent to MD to see if he can switch her medication to [place] or [facility] so that it can be delivered each month.

## 2024-01-30 ENCOUNTER — Other Ambulatory Visit (HOSPITAL_COMMUNITY): Payer: Self-pay

## 2024-01-30 ENCOUNTER — Ambulatory Visit (INDEPENDENT_AMBULATORY_CARE_PROVIDER_SITE_OTHER): Payer: MEDICAID

## 2024-01-30 ENCOUNTER — Ambulatory Visit (HOSPITAL_COMMUNITY): Payer: MEDICAID | Admitting: Student

## 2024-01-30 ENCOUNTER — Other Ambulatory Visit: Payer: Self-pay

## 2024-01-30 VITALS — BP 106/74 | HR 89 | Ht 61.0 in | Wt 187.0 lb

## 2024-01-30 DIAGNOSIS — F203 Undifferentiated schizophrenia: Secondary | ICD-10-CM

## 2024-01-30 DIAGNOSIS — F422 Mixed obsessional thoughts and acts: Secondary | ICD-10-CM | POA: Diagnosis not present

## 2024-01-30 DIAGNOSIS — F200 Paranoid schizophrenia: Secondary | ICD-10-CM

## 2024-01-30 MED ORDER — FLUOXETINE HCL 20 MG PO CAPS: 20.0000 mg | ORAL_CAPSULE | Freq: Every day | ORAL | 3 refills | Status: DC

## 2024-01-30 MED ORDER — ABILIFY MAINTENA 400 MG IM PRSY: 400.0000 mg | PREFILLED_SYRINGE | INTRAMUSCULAR | 11 refills | Status: DC

## 2024-01-30 MED ORDER — FLUOXETINE HCL 40 MG PO CAPS: 40.0000 mg | ORAL_CAPSULE | Freq: Every day | ORAL | 2 refills | Status: DC

## 2024-01-30 NOTE — Progress Notes (Signed)
 Office called and appt was actually today. Sample was used for dose and will courier replacement to office tomorrow.

## 2024-01-30 NOTE — Progress Notes (Signed)
 Specialty Pharmacy Refill Coordination Note  Eileen Rivas is a 23 y.o. female contacted today regarding refills of specialty medication(s) ARIPiprazole (Abilify Maintena)   Patient requested Courier to Provider Office   Delivery date: 02/04/24   Verified address: [name]   Medication will be filled on 4/14.

## 2024-01-30 NOTE — Progress Notes (Addendum)
 Pt presents today for injection of Abilify  maintenna 400mg , administered in left deltoid.Pt denies any concerns for pain or issues.   JNL, CMA   Pt states that she denies hearing or doing any SI, and HI. However. Pt does state that she is still hearing voices and things not there, recently specifically she states she does not want to journal due to her believing she might give in to the "devil". Yet the dog is not commanding her to perform anything she states.

## 2024-01-31 NOTE — Progress Notes (Signed)
 BH MD Outpatient Progress Note  01/31/2024 3:57 PM Eileen Rivas  MRN:  [id number]  Assessment:  Eileen Rivas presents for follow-up evaluation.  Patient's OCD symptoms remain stable, taking up approximately 1 hour of her time each day.  She continues to experience delusional thoughts and has some difficulty reality testing.  Both of these have been the focus of weekly psychotherapy.  Patient reports intermittent adherence to Prozac.  She has been receiving her monthly Abilify injection (though her shot in March was delayed by 2 weeks).  No medication changes planned for today.  The patient was given a calendar to track medication administration, emphasizing the importance of daily taking the medication.  At the next visit the patient should have seen her new primary care provider and will hopefully have an EKG.  This will facilitate [place] prescribing a second antipsychotic agent to treat residual psychotic symptoms (which I feel should take precedence over treatment for OCD).   Identifying information: Eileen Rivas is a 23 y.o. y.o. female with a history of schizophrenia and OCD who is an established patient with [facility] for management of schizophrenia and obsessive thoughts and compulsive behaviors.  It is worth noting that the patient resides with her mother [name]), who is the patient's main social support.  The patient was hospitalized in 2021 at the behavioral hospital for obsessive religious thoughts and was given the diagnosis of OCD.  Plan:  # Obsessive-compulsive disorder Interventions: --Continue Prozac at 60 mg daily - Continue psychotherapy with this provider  # Schizophrenia Interventions: - Abilify maintena 400 mg q28d -- Prev hyperprolactinemia on risperidone LAI  #Long term use of antipsychotic medication -- Lipid panel from 05/28/2023, LDL 82, triglycerides 623 - A1c from 05/28/2023, normal - No EKG, patient will see primary care provider  on 4/29.  I messaged the primary care provider and it appears an EKG has already been ordered. - AIMS of 0 on my assessment Nov 2024  #Elevated TSH, Hypertriglyceridemia - Needs to follow-up with internal medicine clinic or other primary care physician - They expressed their intention to call and set up appointment   Patient was given contact information for behavioral health clinic and was instructed to call 911 for emergencies.   Subjective:  Chief Complaint:  Chief Complaint  Patient presents with   Follow-up    Interval History:  Today the patient demonstrates a relatively linear and logical thought process.  At various points throughout the interview she does disclose delusional thoughts, though she presents these only when asked about them.  She reports a particularly depressed mood today because her embroidery machine broke.  This does not appear to be indicative of consistent severe depression, though the patient certainly would endorse mild depression on a regular basis.  The patient reports fair sleep and appetite.  She denies experiencing any suicidal thoughts.  She reports fleeting and occasional auditory hallucinations, which on further analysis may be more accurately described as intrusive thoughts.  Visit Diagnosis:    ICD-10-CM   1. Undifferentiated schizophrenia (HCC)  F20.3 FLUoxetine (PROZAC) 40 MG capsule    ARIPiprazole ER (ABILIFY MAINTENA) 400 MG PRSY prefilled syringe    2. Mixed obsessional thoughts and acts  F42.2 FLUoxetine (PROZAC) 40 MG capsule    FLUoxetine (PROZAC) 20 MG capsule        Past Psychiatric History: As above  Past Medical History:  Past Medical History:  Diagnosis Date   Anxiety    Depression    Obsessive-compulsive  disorder    No past surgical history on file.  Family Psychiatric History: None pertinent  Family History: No family history on file.  Social History:  Social History   Socioeconomic History   Marital status:  Single    Spouse name: Not on file   Number of children: 0   Years of education: Not on file   Highest education level: Not on file  Occupational History   Occupation: student    Comment: [employer]  Tobacco Use   Smoking status: Never    Passive exposure: Never   Smokeless tobacco: Never  Vaping Use   Vaping status: Never Used  Substance and Sexual Activity   Alcohol use: Yes    Comment: occassional   Drug use: No   Sexual activity: Never  Other Topics Concern   Not on file  Social History Narrative   Not on file   Social Drivers of Health   Financial Resource Strain: Low Risk  (07/24/2022)   Overall Financial Resource Strain (CARDIA)    Difficulty of Paying Living Expenses: Not hard at all  Food Insecurity: Not on File (07/18/2023)   Received from [facility] Insecurity    Food: 0  Transportation Needs: No Transportation Needs (07/24/2022)   PRAPARE - Administrator, Civil Service (Medical): No    Lack of Transportation (Non-Medical): No  Physical Activity: Inactive (07/24/2022)   Exercise Vital Sign    Days of Exercise per Week: 0 days    Minutes of Exercise per Session: 0 min  Stress: Stress Concern Present (07/24/2022)   [employer] of Occupational Health - Occupational Stress Questionnaire    Feeling of Stress : To some extent  Social Connections: Not on File (07/16/2023)   Received from [facility]   Social Connections    Connectedness: 0    Allergies: No Known Allergies  Current Medications: Current Outpatient Medications  Medication Sig Dispense Refill   ARIPiprazole ER (ABILIFY MAINTENA) 400 MG PRSY prefilled syringe Inject 400 mg into the muscle every 28 (twenty-eight) days. 1 each 11   FLUoxetine (PROZAC) 20 MG capsule Take 1 capsule (20 mg total) by mouth daily. 30 capsule 3   FLUoxetine (PROZAC) 40 MG capsule Take 1 capsule (40 mg total) by mouth daily. Take with 20mg . 30 capsule 2   ondansetron (ZOFRAN) 4 MG tablet Take 1 tablet (4  mg total) by mouth every 8 (eight) hours as needed for nausea or vomiting. 20 tablet 0   No current facility-administered medications for this visit.     Objective:  Psychiatric Specialty Exam: Physical Exam Constitutional:      Appearance: the patient is not toxic-appearing.  Pulmonary:     Effort: Pulmonary effort is normal.  Neurological:     General: No focal deficit present.     Mental Status: the patient is alert and oriented to person, place, and time.   Review of Systems  Respiratory:  Negative for shortness of breath.   Cardiovascular:  Negative for chest pain.  Gastrointestinal:  Negative for abdominal pain, constipation, diarrhea, nausea and vomiting.  Neurological:  Negative for headaches.      LMP 12/13/2023 (Approximate)   General Appearance: Fairly Groomed  Eye Contact:  Good  Speech:  Clear and Coherent  Volume:  Normal  Mood:  "depressed"  Affect:  flat  Thought Process:  Coherent  Orientation:  Full (Time, Place, and Person)  Thought Content: Logical during interview, but does report preoccupation with fear of  contamination and "the mark of the beast"  Suicidal Thoughts:  No  Homicidal Thoughts:  No  Memory:  Immediate;   Good  Judgement:  fair  Insight:  fair  Psychomotor Activity:  Normal  Concentration:  Concentration: Good  Recall:  Good  Fund of Knowledge: Good  Language: Good  Akathisia:  No  Handed:    AIMS (if indicated): not done  Assets:  Communication Skills Desire for Improvement Financial Resources/Insurance Housing Leisure Time Physical Health  ADL's:  Intact  Cognition: WNL  Sleep:  Fair     Metabolic Disorder Labs: Lab Results  Component Value Date   HGBA1C 5.5 05/28/2023   Lab Results  Component Value Date   PROLACTIN 16.5 05/28/2023   Lab Results  Component Value Date   CHOL 219 (H) 05/28/2023   TRIG 623 (HH) 05/28/2023   HDL 37 (L) 05/28/2023   CHOLHDL 5.9 (H) 05/28/2023   LDLCALC 82 05/28/2023   Lab  Results  Component Value Date   TSH 5.190 (H) 05/28/2023   TSH 10.615 (H) 05/02/2020    Therapeutic Level Labs: No results found for: "LITHIUM" No results found for: "VALPROATE" No results found for: "CBMZ"  Screenings: AIMS    Flowsheet Row Clinical Support from 09/25/2022 in [facility] Clinical Support from 08/08/2022 in [facility] Office Visit from 07/24/2022 in [facility]  AIMS Total Score 0 1 2 0      AUDIT    Flowsheet Row Ad[facility] (Discharged[facility]  Alcohol Use Disorder Identification Test Final Score (AUDIT) 0      GAD-7    Flowsheet Row Office Visit from 08/20/2023 in [facility]  Total GAD-7 Score 17      PHQ2-9    Flowsheet Row Office Visit from 08/20/2023 in [facility] Counselor from 07/25/2023 in [facility] Office Visit from 07/24/2022 in [facility] ED from 11/08/2020 in [facility] ED from 05/02/2020 in [facility]  PHQ-2 Total Score 5 6 4 2 3   PHQ-9 Total Score 20 19 13 12 15       Flowsheet Row ED from 12/31/2023 in [facility] ED from 11/08/2020 in [facility] Admission (Discharged) [facility]  C-SSRS RISK CATEGORY Moderate Risk High Risk No Risk       Collaboration of Care: none  A total of 30 minutes was spent involved in face to face clinical care, chart review, documentation.   Carlyn Reichert, MD 01/31/2024, 3:57 PM

## 2024-02-06 ENCOUNTER — Ambulatory Visit (INDEPENDENT_AMBULATORY_CARE_PROVIDER_SITE_OTHER): Payer: MEDICAID | Admitting: Student

## 2024-02-06 DIAGNOSIS — F429 Obsessive-compulsive disorder, unspecified: Secondary | ICD-10-CM | POA: Diagnosis not present

## 2024-02-10 NOTE — Progress Notes (Signed)
 [facility] [address] [place] [address] Dept: [phone] Dept Fax: [phone]  Psychotherapy Progress Note  Patient ID: Eileen Rivas, female  DOB: [birth year], 23 y.o.  MRN: [id number]  02/06/2024 Start time: 10:10 AM End time: 10:50 AM  Method of Visit: In person  Present: patient, provider  Current Concerns: excessive handwashing, inability to perform daily tasks  Current Symptoms: Compulsive behaviors, avoidance behaviors  Psychiatric Specialty Exam:     General Appearance: Fairly Groomed  Eye Contact:  Good  Speech:  Clear and Coherent  Volume:  Normal  Mood:  "okay"  Affect:  constricted  Thought Process:  Coherent  Orientation:  Full (Time, Place, and Person)  Thought Content: Logical   Suicidal Thoughts:  No  Homicidal Thoughts:  No  Memory:  Immediate;   Good  Judgement:  fair  Insight:  poor  Psychomotor Activity:  Normal  Concentration:  Concentration: Good  Recall:  Good  Fund of Knowledge: Good  Language: Good  Akathisia:  No  Handed:  not assessed  AIMS (if indicated): not done  Assets:  Communication Skills Desire for Improvement Financial Resources/Insurance Housing Leisure Time Physical Health  ADL's:  Intact  Cognition: WNL  Sleep:  Fair    Diagnosis: Obsessive-compulsive disorder   Anticipated Frequency of Visits: Once weekly Anticipated Length of Treatment Episode: To be determined  Short Term Goals/Goals for Treatment Session: The patient practice a cognitive algorithm for evaluating the veracity of problematic beliefs.  These beliefs have been causing significant limitations in the patient's daily function.  The patient was receptive and we practiced the cognitive process several times during the visit.  Progress Towards Goals: Initial  Treatment Intervention: Exposure response prevention   09/27/23 1411  Anxiety Long Term Goals  Anxiety Long Term Goals Stabilize  anxiety level while increasing ability to function on a daily basis  Anxiety Short Term Goals  Anxiety Short Term Goals Identify an anxiety coping mechanism that has been successful in the past and increase its use  Acknowledge irrational nature of the fears Assist the client in developing an awareness of the irrational nature of his/her fears  Anxiety Treatment Plan Status  Anxiety Treatment Plan Status Progressing, as evidenced by patient being able to name a coping strategy to help with the anxiety that comes with daily chores, as well as the patient responding positively to "weighing the evidence" approaches,.       Medical Necessity: Improved patient condition  Assessment Tools:    08/20/2023    1:52 PM 07/27/2023    8:00 AM 07/24/2022    2:11 PM  Depression screen PHQ 2/9  Decreased Interest 3 3 3   Down, Depressed, Hopeless 2 3 1   PHQ - 2 Score 5 6 4   Altered sleeping 3 2 3   Tired, decreased energy 2 3 3   Change in appetite 3 2 3   Feeling bad or failure about yourself  2 1 0  Trouble concentrating 2 2 0  Moving slowly or fidgety/restless 3 2 0  Suicidal thoughts 0 1 0  PHQ-9 Score 20 19 13   Difficult doing work/chores Very difficult      Failed to redirect to the Timeline version of the REVFS SmartLink. Flowsheet Row ED from 12/31/2023 in [facility] ED from 11/08/2020 in [facility] Admission (Discharged) [facility]  C-SSRS RISK CATEGORY Moderate Risk High Risk No Risk  Collaboration of Care: none  Patient/Guardian was advised Release of Information must be obtained prior to any record release in order to collaborate their care with an outside provider. Patient/Guardian was advised if they have not already done so to contact the registration department to sign all necessary forms in order for us  to release information regarding their care.    Consent: Patient/Guardian gives verbal consent for treatment and assignment of benefits for services provided during this visit. Patient/Guardian expressed understanding and agreed to proceed.   Plan:  Continue exposure therapy, addressing compulsive behaviors (handwashing) as well as avoidance behaviors (not handling trash) Address cognitive distortions surrounding self worth and familial conflict Continue to work on reality testing regarding possible delusional thoughts  Marilou Showman, MD 02/10/2024

## 2024-02-11 ENCOUNTER — Telehealth (INDEPENDENT_AMBULATORY_CARE_PROVIDER_SITE_OTHER): Payer: Self-pay | Admitting: Primary Care

## 2024-02-11 NOTE — Telephone Encounter (Signed)
 Called pt to confirm appt. Pt will be present.

## 2024-02-13 ENCOUNTER — Ambulatory Visit (INDEPENDENT_AMBULATORY_CARE_PROVIDER_SITE_OTHER): Payer: MEDICAID | Admitting: Student

## 2024-02-13 DIAGNOSIS — F429 Obsessive-compulsive disorder, unspecified: Secondary | ICD-10-CM | POA: Diagnosis not present

## 2024-02-16 NOTE — Progress Notes (Signed)
 [facility] [address] [place] [address] Dept: [phone] Dept Fax: [phone]  Psychotherapy Progress Note  Patient ID: Eileen Rivas, female  DOB: 2001-07-07, 23 y.o.  MRN: [id number]  02/13/2024 Start time: 10:10 AM End time: 10:50 AM  Method of Visit: In person  Present: patient, provider  Current Concerns: excessive handwashing, inability to perform daily tasks  Current Symptoms: Compulsive behaviors, avoidance behaviors  Psychiatric Specialty Exam:     General Appearance: Fairly Groomed  Eye Contact:  Good  Speech:  Clear and Coherent  Volume:  Normal  Mood:  "okay"  Affect:  constricted  Thought Process:  Coherent  Orientation:  Full (Time, Place, and Person)  Thought Content: Logical   Suicidal Thoughts:  No  Homicidal Thoughts:  No  Memory:  Immediate;   Good  Judgement:  fair  Insight:  poor  Psychomotor Activity:  Normal  Concentration:  Concentration: Good  Recall:  Good  Fund of Knowledge: Good  Language: Good  Akathisia:  No  Handed:  not assessed  AIMS (if indicated): not done  Assets:  Communication Skills Desire for Improvement Financial Resources/Insurance Housing Leisure Time Physical Health  ADL's:  Intact  Cognition: WNL  Sleep:  Fair    Diagnosis: Obsessive-compulsive disorder   Anticipated Frequency of Visits: Once weekly Anticipated Length of Treatment Episode: To be determined  Short Term Goals/Goals for Treatment Session: Patient continued work on process for identifying inaccurate or delusional beliefs during this session. Unfortunately she did not work on the process over the last week. We worked through several examples in the session so the patient can practice this coming week.   Progress Towards Goals: Initial  Treatment Intervention: Exposure response prevention   09/27/23 1411  Anxiety Long Term Goals  Anxiety Long Term Goals Stabilize anxiety level  while increasing ability to function on a daily basis  Anxiety Short Term Goals  Anxiety Short Term Goals Identify an anxiety coping mechanism that has been successful in the past and increase its use  Acknowledge irrational nature of the fears Assist the client in developing an awareness of the irrational nature of his/her fears  Anxiety Treatment Plan Status  Anxiety Treatment Plan Status Progressing, as evidenced by patient being able to name a coping strategy to help with the anxiety that comes with daily chores, as well as the patient responding positively to "weighing the evidence" approaches,.       Medical Necessity: Improved patient condition  Assessment Tools:    08/20/2023    1:52 PM 07/27/2023    8:00 AM 07/24/2022    2:11 PM  Depression screen PHQ 2/9  Decreased Interest 3 3 3   Down, Depressed, Hopeless 2 3 1   PHQ - 2 Score 5 6 4   Altered sleeping 3 2 3   Tired, decreased energy 2 3 3   Change in appetite 3 2 3   Feeling bad or failure about yourself  2 1 0  Trouble concentrating 2 2 0  Moving slowly or fidgety/restless 3 2 0  Suicidal thoughts 0 1 0  PHQ-9 Score 20 19 13   Difficult doing work/chores Very difficult      Failed to redirect to the Timeline version of the REVFS SmartLink. Flowsheet Row UC from 12/31/2023 in [facility]) ED from 11/08/2020 in [facility] Admission (Discharged) [facility]  C-SSRS RISK CATEGORY Moderate Risk High Risk No Risk  Collaboration of Care: none  Patient/Guardian was advised Release of Information must be obtained prior to any record release in order to collaborate their care with an outside provider. Patient/Guardian was advised if they have not already done so to contact the registration department to sign all necessary forms in order for us  to release information regarding their care.   Consent:  Patient/Guardian gives verbal consent for treatment and assignment of benefits for services provided during this visit. Patient/Guardian expressed understanding and agreed to proceed.   Plan:  Continue exposure therapy, addressing compulsive behaviors (handwashing) as well as avoidance behaviors (not handling trash) Address cognitive distortions surrounding self worth and familial conflict Continue to work on reality testing regarding possible delusional thoughts  Marilou Showman, MD 02/16/2024

## 2024-02-17 ENCOUNTER — Telehealth (INDEPENDENT_AMBULATORY_CARE_PROVIDER_SITE_OTHER): Payer: Self-pay | Admitting: Primary Care

## 2024-02-17 ENCOUNTER — Other Ambulatory Visit (HOSPITAL_COMMUNITY): Payer: Self-pay

## 2024-02-17 NOTE — Telephone Encounter (Signed)
 Called to confirm appt. Pt did not answer and could not leave VM.

## 2024-02-18 ENCOUNTER — Ambulatory Visit (INDEPENDENT_AMBULATORY_CARE_PROVIDER_SITE_OTHER): Payer: MEDICAID | Admitting: Primary Care

## 2024-02-18 ENCOUNTER — Encounter (INDEPENDENT_AMBULATORY_CARE_PROVIDER_SITE_OTHER): Payer: Self-pay | Admitting: Primary Care

## 2024-02-18 VITALS — BP 100/69 | HR 71 | Resp 16 | Ht 61.5 in | Wt 187.2 lb

## 2024-02-18 DIAGNOSIS — Z79899 Other long term (current) drug therapy: Secondary | ICD-10-CM | POA: Diagnosis not present

## 2024-02-18 DIAGNOSIS — E66811 Obesity, class 1: Secondary | ICD-10-CM

## 2024-02-18 DIAGNOSIS — Z1159 Encounter for screening for other viral diseases: Secondary | ICD-10-CM | POA: Diagnosis not present

## 2024-02-18 DIAGNOSIS — Z6834 Body mass index (BMI) 34.0-34.9, adult: Secondary | ICD-10-CM

## 2024-02-18 DIAGNOSIS — N921 Excessive and frequent menstruation with irregular cycle: Secondary | ICD-10-CM

## 2024-02-18 DIAGNOSIS — Z0000 Encounter for general adult medical examination without abnormal findings: Secondary | ICD-10-CM | POA: Diagnosis not present

## 2024-02-18 DIAGNOSIS — Z114 Encounter for screening for human immunodeficiency virus [HIV]: Secondary | ICD-10-CM | POA: Diagnosis not present

## 2024-02-18 DIAGNOSIS — E661 Drug-induced obesity: Secondary | ICD-10-CM

## 2024-02-18 NOTE — Telephone Encounter (Signed)
 Patient injection of Abilify  Maintena will be delivered this week per [facility] pharmacy.

## 2024-02-18 NOTE — Progress Notes (Signed)
 New Patient Office Visit  Subjective    Patient ID: Eileen Rivas female  DOB: Apr 27, 2001  Age: 23 y.o. MRN: [id number]   CC:  Psyche requesting EKG  HPI     Medication Management    Additional comments: Marilou Showman MD- They are in need of an EKG because they are on a long term dopamine blocking medication (Abilify ).       Last edited by Kathryne Parisian, RMA on 02/18/2024  9:23 AM.      HPI  Eileen Rivas is a 23 year old Hispanic female she is here today to establish care.  She is follow-up by behavior health and voiced concerns due to the medication she is prescribed would like to have a EKG done at her visit.  Responded to Dr. Rodney Clamp will perform EKG send him the results any abnormalities will refer to cardiology.  Dr. Madelyne Schiff in agreement. Current Outpatient Medications on File Prior to Visit  Medication Sig Dispense Refill   ARIPiprazole  ER (ABILIFY  MAINTENA) 400 MG PRSY prefilled syringe Inject 400 mg into the muscle every 28 (twenty-eight) days. 1 each 11   FLUoxetine  (PROZAC ) 20 MG capsule Take 1 capsule (20 mg total) by mouth daily. 30 capsule 3   FLUoxetine  (PROZAC ) 40 MG capsule Take 1 capsule (40 mg total) by mouth daily. Take with 20mg . 30 capsule 2   ondansetron  (ZOFRAN ) 4 MG tablet Take 1 tablet (4 mg total) by mouth every 8 (eight) hours as needed for nausea or vomiting. 20 tablet 0   No current facility-administered medications on file prior to visit.     No Known Allergies  Past Medical History:  Diagnosis Date   Anxiety    Depression    Obsessive-compulsive disorder      No past surgical history on file.   No family history on file.  Social History   Socioeconomic History   Marital status: Single    Spouse name: Not on file   Number of children: 0   Years of education: Not on file   Highest education level: Not on file  Occupational History   Occupation: student    Comment: [employer]  Tobacco Use   Smoking status: Never     Passive exposure: Never   Smokeless tobacco: Never  Vaping Use   Vaping status: Never Used  Substance and Sexual Activity   Alcohol use: Yes    Comment: occassional   Drug use: No   Sexual activity: Never  Other Topics Concern   Not on file  Social History Narrative   Not on file   Social Drivers of Health   Financial Resource Strain: Low Risk  (07/24/2022)   Overall Financial Resource Strain (CARDIA)    Difficulty of Paying Living Expenses: Not hard at all  Food Insecurity: Not on File (07/18/2023)   Received from [facility] Insecurity    Food: 0  Transportation Needs: No Transportation Needs (07/24/2022)   PRAPARE - Administrator, Civil Service (Medical): No    Lack of Transportation (Non-Medical): No  Physical Activity: Inactive (07/24/2022)   Exercise Vital Sign    Days of Exercise per Week: 0 days    Minutes of Exercise per Session: 0 min  Stress: Stress Concern Present (07/24/2022)   [employer] of Occupational Health - Occupational Stress Questionnaire    Feeling of Stress : To some extent  Social Connections: Not on File (07/16/2023)   Received from [facility]   Social Connections  Connectedness: 0  Intimate Partner Violence: Not on file    SDOH Interventions Today    Flowsheet Row Most Recent Value  SDOH Interventions   Depression Interventions/Treatment  Medication  [pt already sees behavorial health]        Health Maintenance  Topic Date Due   COVID-19 Vaccine (1) Never done   HPV Vaccine (1 - 3-dose series) Never done   HIV Screening  Never done   Hepatitis C Screening  Never done   DTaP/Tdap/Td vaccine (1 - Tdap) Never done   Pap Smear  Never done   Flu Shot  05/22/2024   Meningitis B Vaccine  Aged Out    Objective   BP 100/69   Pulse 71   Resp 16   Ht 5' 1.5" (1.562 m)   Wt 187 lb 3.2 oz (84.9 kg)   SpO2 99%   BMI 34.80 kg/m    Physical Exam Vitals reviewed.  Constitutional:      Appearance: Normal appearance. She  is obese.  HENT:     Head: Normocephalic.     Right Ear: Tympanic membrane, ear canal and external ear normal.     Left Ear: Tympanic membrane, ear canal and external ear normal.     Nose: Nose normal.     Mouth/Throat:     Mouth: Mucous membranes are moist.  Eyes:     Extraocular Movements: Extraocular movements intact.     Pupils: Pupils are equal, round, and reactive to light.  Cardiovascular:     Rate and Rhythm: Normal rate.  Pulmonary:     Effort: Pulmonary effort is normal.     Breath sounds: Normal breath sounds.  Abdominal:     General: Bowel sounds are normal.     Palpations: Abdomen is soft.  Musculoskeletal:        General: Normal range of motion.     Cervical back: Normal range of motion.  Skin:    General: Skin is warm and dry.     Comments: Pigmentation on left side of left followed by dermatology   Neurological:     Mental Status: She is alert and oriented to person, place, and time.  Psychiatric:        Mood and Affect: Mood normal.        Behavior: Behavior normal.        Thought Content: Thought content normal.      Assessment & Plan:  Eileen Rivas was seen today for new patient (initial visit) and medication management.  Diagnoses and all orders for this visit:  Medication management -     EKG 12-Lead forwards results see HPI  Encounter for medical examination to establish care  Encounter for long-term (current) use of high-risk medication -     EKG 12-Lead -     Cancel: TSH + free T4 -     Cancel: Lipid panel -     Lipid panel; Future -     TSH + free T4; Future  Encounter for screening for HIV -     Cancel: HIV Antibody (routine testing w rflx) -     HIV Antibody (routine testing w rflx); Future  Encounter for HCV screening test for low risk patient -     Cancel: HCV Ab w Reflex to Quant PCR -     HCV Ab w Reflex to Quant PCR; Future  Class 1 drug-induced obesity without serious comorbidity with body mass index (BMI) of 34.0 to 34.9 in  adult Obesity  is 30-39 indicating an excess in caloric intake or underlining conditions. This may lead to other co-morbidities. Educated on lifestyle modifications of diet and exercise which may reduce obesity.   -     Cancel: TSH + free T4 -     Cancel: Lipid panel -     Lipid panel; Future -     TSH + free T4; Future  Menorrhagia with irregular cycle -     CBC with Differential/Platelet; Future  Follow-up:  6 weeks pap   The above assessment and management plan was discussed with the patient. The patient verbalized understanding of and has agreed to the management plan. Patient is aware to call the clinic if symptoms fail to improve or worsen. Patient is aware when to return to the clinic for a follow-up visit. Patient educated on when it is appropriate to go to the emergency department.   Madelyn Schick, NP-C

## 2024-02-19 ENCOUNTER — Other Ambulatory Visit: Payer: Self-pay | Admitting: Pharmacy Technician

## 2024-02-19 ENCOUNTER — Other Ambulatory Visit (HOSPITAL_COMMUNITY): Payer: Self-pay

## 2024-02-19 ENCOUNTER — Other Ambulatory Visit: Payer: Self-pay

## 2024-02-19 NOTE — Progress Notes (Signed)
 Specialty Pharmacy Refill Coordination Note  Eileen Rivas is a 23 y.o. female contacted today regarding refills of specialty medication(s) ARIPiprazole  (Abilify  Eileen Rivas)  Spoke with mom   Patient requested Courier to Provider Office   Delivery date: 02/24/24   [facility]ified [facility]   Medication will be filled on 02/21/24.    Appt 5/8. RR sent to MD patient mom aware   This fill date is pending response to refill request from provider. Patient is aware and if they have not received fill by intended date they must follow up with pharmacy.

## 2024-02-20 ENCOUNTER — Ambulatory Visit (HOSPITAL_COMMUNITY): Payer: MEDICAID | Admitting: Student

## 2024-02-20 ENCOUNTER — Other Ambulatory Visit (HOSPITAL_COMMUNITY): Payer: Self-pay

## 2024-02-20 DIAGNOSIS — F429 Obsessive-compulsive disorder, unspecified: Secondary | ICD-10-CM | POA: Diagnosis not present

## 2024-02-20 NOTE — Progress Notes (Signed)
 [facility] [address] [place] [address] Dept: [phone] Dept Fax: [phone]  Psychotherapy Progress Note  Patient ID: Eileen Rivas, female  DOB: Jul 01, 2001, 23 y.o.  MRN: [id number]  02/20/2024 Start time: 10:10 AM End time: 10:50 AM  Method of Visit: In person  Present: patient, provider  Current Concerns: excessive handwashing, inability to perform daily tasks  Current Symptoms: Compulsive behaviors, avoidance behaviors  Psychiatric Specialty Exam:     General Appearance: Fairly Groomed  Eye Contact:  Good  Speech:  Clear and Coherent  Volume:  Normal  Mood:  "okay"  Affect:  constricted  Thought Process:  Coherent  Orientation:  Full (Time, Place, and Person)  Thought Content: Logical   Suicidal Thoughts:  No  Homicidal Thoughts:  No  Memory:  Immediate;   Good  Judgement:  fair  Insight:  poor  Psychomotor Activity:  Normal  Concentration:  Concentration: Good  Recall:  Good  Fund of Knowledge: Good  Language: Good  Akathisia:  No  Handed:  not assessed  AIMS (if indicated): not done  Assets:  Communication Skills Desire for Improvement Financial Resources/Insurance Housing Leisure Time Physical Health  ADL's:  Intact  Cognition: WNL  Sleep:  Fair    Diagnosis: Obsessive-compulsive disorder   Anticipated Frequency of Visits: Once weekly Anticipated Length of Treatment Episode: To be determined  Short Term Goals/Goals for Treatment Session: Patient was able to complete assigned task of completing  reality testing process several times over the past week. As with the previous visit, we worked through a few examples in the office. She will continue to work on this.   Progress Towards Goals: Initial  Treatment Intervention: Exposure response prevention   09/27/23 1411  Anxiety Long Term Goals  Anxiety Long Term Goals Stabilize anxiety level while increasing ability to function on a  daily basis  Anxiety Short Term Goals  Anxiety Short Term Goals Identify an anxiety coping mechanism that has been successful in the past and increase its use  Acknowledge irrational nature of the fears Assist the client in developing an awareness of the irrational nature of his/her fears  Anxiety Treatment Plan Status  Anxiety Treatment Plan Status Progressing, as evidenced by patient being able to name a coping strategy to help with the anxiety that comes with daily chores, as well as the patient responding positively to "weighing the evidence" approaches,.       Medical Necessity: Improved patient condition  Assessment Tools:    02/18/2024    9:32 AM 08/20/2023    1:52 PM 07/27/2023    8:00 AM  Depression screen PHQ 2/9  Decreased Interest 2 3 3   Down, Depressed, Hopeless 1 2 3   PHQ - 2 Score 3 5 6   Altered sleeping 1 3 2   Tired, decreased energy 2 2 3   Change in appetite 3 3 2   Feeling bad or failure about yourself  2 2 1   Trouble concentrating 1 2 2   Moving slowly or fidgety/restless 2 3 2   Suicidal thoughts 0 0 1  PHQ-9 Score 14 20 19   Difficult doing work/chores Somewhat difficult Very difficult     Failed to redirect to the Timeline version of the REVFS SmartLink. Flowsheet Row UC from 12/31/2023 in [facility]) ED from 11/08/2020 in [facility] (Discharged[facility]  C-SSRS RISK CATEGORY Moderate Risk High Risk No Risk  Collaboration of Care: none  Patient/Guardian was advised Release of Information must be obtained prior to any record release in order to collaborate their care with an outside provider. Patient/Guardian was advised if they have not already done so to contact the registration department to sign all necessary forms in order for us  to release information regarding their care.   Consent: Patient/Guardian gives  verbal consent for treatment and assignment of benefits for services provided during this visit. Patient/Guardian expressed understanding and agreed to proceed.   Plan:  Continue exposure therapy, addressing compulsive behaviors (handwashing) as well as avoidance behaviors (not handling trash) Address cognitive distortions surrounding self worth and familial conflict Continue to work on reality testing regarding possible delusional thoughts  Marilou Showman, MD 02/20/2024

## 2024-02-21 ENCOUNTER — Other Ambulatory Visit (HOSPITAL_COMMUNITY): Payer: Self-pay

## 2024-02-24 ENCOUNTER — Other Ambulatory Visit (HOSPITAL_COMMUNITY): Payer: Self-pay | Admitting: Primary Care

## 2024-02-24 ENCOUNTER — Other Ambulatory Visit (HOSPITAL_COMMUNITY): Payer: Self-pay

## 2024-02-24 ENCOUNTER — Other Ambulatory Visit: Payer: Self-pay

## 2024-02-24 NOTE — Progress Notes (Signed)
 Refills have been denied twice, and it appears prescription was sent to [facility] 01/30/24. Routed to [name] to confirm that patient will be filling with [place], going forward.

## 2024-02-25 ENCOUNTER — Other Ambulatory Visit: Payer: Self-pay

## 2024-02-25 ENCOUNTER — Ambulatory Visit (INDEPENDENT_AMBULATORY_CARE_PROVIDER_SITE_OTHER): Payer: MEDICAID

## 2024-02-25 NOTE — Progress Notes (Signed)
 Confirmed patient using [employer]. Dis-enrolled

## 2024-02-25 NOTE — Progress Notes (Signed)
 Patient now using [employer]. Dis-enrolling.

## 2024-02-27 ENCOUNTER — Ambulatory Visit (HOSPITAL_COMMUNITY): Payer: MEDICAID

## 2024-02-27 ENCOUNTER — Ambulatory Visit (INDEPENDENT_AMBULATORY_CARE_PROVIDER_SITE_OTHER): Payer: MEDICAID | Admitting: Student

## 2024-02-27 ENCOUNTER — Other Ambulatory Visit (HOSPITAL_COMMUNITY): Payer: Self-pay

## 2024-02-27 VITALS — BP 101/69 | HR 83

## 2024-02-27 DIAGNOSIS — R635 Abnormal weight gain: Secondary | ICD-10-CM

## 2024-02-27 DIAGNOSIS — F203 Undifferentiated schizophrenia: Secondary | ICD-10-CM

## 2024-02-27 DIAGNOSIS — F422 Mixed obsessional thoughts and acts: Secondary | ICD-10-CM

## 2024-02-27 DIAGNOSIS — T50905A Adverse effect of unspecified drugs, medicaments and biological substances, initial encounter: Secondary | ICD-10-CM | POA: Diagnosis not present

## 2024-02-27 MED ORDER — ARIPIPRAZOLE ER 400 MG IM PRSY
400.0000 mg | PREFILLED_SYRINGE | Freq: Once | INTRAMUSCULAR | Status: AC
  Administered 2024-02-27: 400 mg via INTRAMUSCULAR

## 2024-02-27 MED ORDER — OLANZAPINE 5 MG PO TABS
5.0000 mg | ORAL_TABLET | Freq: Every day | ORAL | 1 refills | Status: DC
  Filled 2024-02-27: qty 30, 30d supply, fill #0

## 2024-02-27 MED ORDER — FLUOXETINE HCL 40 MG PO CAPS
40.0000 mg | ORAL_CAPSULE | Freq: Every day | ORAL | 2 refills | Status: DC
Start: 2024-02-27 — End: 2024-03-19
  Filled 2024-02-27: qty 30, 30d supply, fill #0

## 2024-02-27 MED ORDER — FLUOXETINE HCL 20 MG PO CAPS
20.0000 mg | ORAL_CAPSULE | Freq: Every day | ORAL | 3 refills | Status: DC
  Filled 2024-02-27: qty 30, 30d supply, fill #0

## 2024-02-27 NOTE — Progress Notes (Cosign Needed Addendum)
 Pt presents today for injection of Abilify  maintenna 400mg , administered in right deltoid.Pt denies any concerns for pain or issues.    JNL, CMA    Pt states that she is hearing voices but denies  SI, and HI. However. Pt does state that she is still hearing voices and things not there, recently specifically she states she does not want to journal due to her believing she might give in to the "devil" still.   JNL, CMA

## 2024-03-02 ENCOUNTER — Other Ambulatory Visit (INDEPENDENT_AMBULATORY_CARE_PROVIDER_SITE_OTHER): Payer: Self-pay | Admitting: Primary Care

## 2024-03-02 ENCOUNTER — Ambulatory Visit (INDEPENDENT_AMBULATORY_CARE_PROVIDER_SITE_OTHER): Payer: MEDICAID

## 2024-03-02 DIAGNOSIS — N921 Excessive and frequent menstruation with irregular cycle: Secondary | ICD-10-CM | POA: Diagnosis not present

## 2024-03-02 DIAGNOSIS — Z79899 Other long term (current) drug therapy: Secondary | ICD-10-CM

## 2024-03-02 DIAGNOSIS — Z114 Encounter for screening for human immunodeficiency virus [HIV]: Secondary | ICD-10-CM

## 2024-03-02 DIAGNOSIS — Z1159 Encounter for screening for other viral diseases: Secondary | ICD-10-CM

## 2024-03-02 DIAGNOSIS — E66811 Obesity, class 1: Secondary | ICD-10-CM

## 2024-03-02 MED ORDER — METFORMIN HCL ER 500 MG PO TB24: 500.0000 mg | ORAL_TABLET | Freq: Every day | ORAL | 2 refills | Status: DC

## 2024-03-02 NOTE — Progress Notes (Signed)
 Pt came into the office for lab work

## 2024-03-02 NOTE — Progress Notes (Signed)
 BH MD Outpatient Progress Note  03/02/2024 8:33 AM [name]  MRN:  [id number]  Assessment:  Eileen Rivas presents for follow-up evaluation.  Today the patient has continued issues with reality testing of delusional thoughts: she is worried about the government thinking she is stealing information when she is watching youtube videos. This has been the focus of psychotherapy sessions for several weeks, but it seems there has been no improvement.   Plan to start Zyprexa  as a second antipsychotic medication, in addition to her Abilify  Maintena. Metformin will be prescribed as well. The patient has already gained a significant amount of weight on Abilify . It's felt that the efficacy advantage of Zyprexa  is worth the increased risk of metabolic side effects.  There are limited options available because risperidone was not effective previously and she seems to have experienced hyperprolactinemia on the medication. FGA was decided against based on the history of prolactin elevation.   Identifying information: Eileen Rivas is a 23 y.o. y.o. female with a history of schizophrenia and OCD who is an established patient with [facility] for management of schizophrenia and obsessive thoughts and compulsive behaviors.  It is worth noting that the patient resides with her mother Darren Em), who is the patient's main social support.  The patient was hospitalized in 2021 at the behavioral hospital for obsessive religious thoughts and was given the diagnosis of OCD.  Plan:  # Obsessive-compulsive disorder Interventions: -- Continue Prozac  at 60 mg daily -- Medication adherence remains an issue. Patient has been given calendars to track medication administration, but this has not been done. - Continue psychotherapy with this provider  # Schizophrenia Interventions: -- Start Zyprexa  5 mg qHS - Abilify  maintena 400 mg q28d -- Prev hyperprolactinemia on risperidone LAI  #  Antipsychotic induced weight gain -- Quite clear based on timeline: consistently 120 lbs before 04/2020 (her first psychiatric hospitalization). Has been about 180 lbs for the past few months.  -- Patient has complained about psychological impact of weight gain -- Start metformin XR 500 daily -- Discussed common side effects   #Long term use of antipsychotic medication -- Lipid panel from 05/28/2023, LDL 82, triglycerides 623 - A1c from 05/28/2023, normal - EKG unremarkable April 2025 (appreciate PCP assistance) -- No TD evident on previous encounters  #Elevated TSH, Hypertriglyceridemia - Needs to be addressed by PCP   Patient was given contact information for behavioral health clinic and was instructed to call 911 for emergencies.   Subjective:  Chief Complaint:  Chief Complaint  Patient presents with   Follow-up    Interval History:  Today the patient is preoccupied with concerns the government thinks she is stealing. We worked through her reality testing algorithm but the patient was persistently afraid: even after the topic was settled and we moved on, she would occasional ask if the government thought she was stealing. She was amenable to reinforcement from this provider that the thoughts are untrue.  The patient denies auditory and visual hallucinations.  She denies first rank symptoms. Her only paranoia involves the statements above. She denies thoughts of harming others.  The patient reports significant depression. She reports fair sleep. She does enjoy doing embroidery but has been unable to do it over the past few weeks. She denies SI.   Visit Diagnosis:    ICD-10-CM   1. Undifferentiated schizophrenia (HCC) [F20.3]  F20.3 OLANZapine  (ZYPREXA ) 5 MG tablet    FLUoxetine  (PROZAC ) 40 MG capsule    2. Mixed obsessional thoughts  and acts  F42.2 FLUoxetine  (PROZAC ) 20 MG capsule    FLUoxetine  (PROZAC ) 40 MG capsule        Past Psychiatric History: As above  Past  Medical History:  Past Medical History:  Diagnosis Date   Anxiety    Depression    Obsessive-compulsive disorder    No past surgical history on file.  Family Psychiatric History: None pertinent  Family History: No family history on file.  Social History:  Social History   Socioeconomic History   Marital status: Single    Spouse name: Not on file   Number of children: 0   Years of education: Not on file   Highest education level: Not on file  Occupational History   Occupation: student    Comment: [employer]  Tobacco Use   Smoking status: Never    Passive exposure: Never   Smokeless tobacco: Never  Vaping Use   Vaping status: Never Used  Substance and Sexual Activity   Alcohol use: Yes    Comment: occassional   Drug use: No   Sexual activity: Never  Other Topics Concern   Not on file  Social History Narrative   Not on file   Social Drivers of Health   Financial Resource Strain: Low Risk  (07/24/2022)   Overall Financial Resource Strain (CARDIA)    Difficulty of Paying Living Expenses: Not hard at all  Food Insecurity: Not on File (07/18/2023)   Received from [facility]    Food: 0  Transportation Needs: No Transportation Needs (07/24/2022)   PRAPARE - Administrator, Civil Service (Medical): No    Lack of Transportation (Non-Medical): No  Physical Activity: Inactive (07/24/2022)   Exercise Vital Sign    Days of Exercise per Week: 0 days    Minutes of Exercise per Session: 0 min  Stress: Stress Concern Present (07/24/2022)   [employer] of Occupational Health - Occupational Stress Questionnaire    Feeling of Stress : To some extent  Social Connections: Not on File (07/16/2023)   Received from [facility]   Social Connections    Connectedness: 0    Allergies: No Known Allergies  Current Medications: Current Outpatient Medications  Medication Sig Dispense Refill   OLANZapine  (ZYPREXA ) 5 MG tablet Take 1 tablet (5 mg total) by  mouth at bedtime. 30 tablet 1   ARIPiprazole  ER (ABILIFY  MAINTENA) 400 MG PRSY prefilled syringe Inject 400 mg into the muscle every 28 (twenty-eight) days. 1 each 11   FLUoxetine  (PROZAC ) 20 MG capsule Take 1 capsule (20 mg total) by mouth daily. 30 capsule 3   FLUoxetine  (PROZAC ) 40 MG capsule Take 1 capsule (40 mg total) by mouth daily. Take with 20mg . 30 capsule 2   No current facility-administered medications for this visit.     Objective:  Psychiatric Specialty Exam: Physical Exam Constitutional:      Appearance: the patient is not toxic-appearing.  Pulmonary:     Effort: Pulmonary effort is normal.  Neurological:     General: No focal deficit present.     Mental Status: the patient is alert and oriented to person, place, and time.   Review of Systems  Respiratory:  Negative for shortness of breath.   Cardiovascular:  Negative for chest pain.  Gastrointestinal:  Negative for abdominal pain, constipation, diarrhea, nausea and vomiting.  Neurological:  Negative for headaches.      There were no vitals taken for this visit.  General Appearance: Fairly Groomed  Eye  Contact:  Good  Speech:  Clear and Coherent  Volume:  Normal  Mood:  "depressed"  Affect:  flat  Thought Process:  Coherent  Orientation:  Full (Time, Place, and Person)  Thought Content: delusional thought content noted  Suicidal Thoughts:  No  Homicidal Thoughts:  No  Memory:  Immediate;   Good  Judgement:  fair  Insight:  fair  Psychomotor Activity:  Normal  Concentration:  Concentration: Good  Recall:  Good  Fund of Knowledge: Good  Language: Good  Akathisia:  No  Handed:    AIMS (if indicated): not done  Assets:  Communication Skills Desire for Improvement Financial Resources/Insurance Housing Leisure Time Physical Health  ADL's:  Intact  Cognition: WNL       Metabolic Disorder Labs: Lab Results  Component Value Date   HGBA1C 5.5 05/28/2023   Lab Results  Component Value Date    PROLACTIN 16.5 05/28/2023   Lab Results  Component Value Date   CHOL 219 (H) 05/28/2023   TRIG 623 (HH) 05/28/2023   HDL 37 (L) 05/28/2023   CHOLHDL 5.9 (H) 05/28/2023   LDLCALC 82 05/28/2023   Lab Results  Component Value Date   TSH 5.190 (H) 05/28/2023   TSH 10.615 (H) 05/02/2020    Therapeutic Level Labs: No results found for: "LITHIUM" No results found for: "VALPROATE" No results found for: "CBMZ"  Screenings: AIMS    Flowsheet Row Clinical Support from 09/25/2022 in [facility] Clinical Support from 08/08/2022 in [facility] Office Visit from 07/24/2022 in [facility] Admission (Discharged) from [facility]  AIMS Total Score 0 1 2 0      AUDIT    Flowsheet Row Admission (Discharged) from [facility]  Alcohol Use Disorder Identification Test Final Score (AUDIT) 0      GAD-7    Flowsheet Row Office Visit from 02/18/2024 in [facility] Office Visit from 08/20/2023 in [facility]  Total GAD-7 Score 7 17      PHQ2-9    Flowsheet Row Office Visit from 02/18/2024 in [facility] Office Visit from 08/20/2023 in [facility] Counselor from 07/25/2023 in [facility] Office Visit from 07/24/2022 in [facility] ED from 11/08/2020 in [facility]  PHQ-2 Total Score 3 5 6 4 2   PHQ-9 Total Score 14 20 19 13 12       Flowsheet Row UC from 12/31/2023 in [facility]) ED from 11/08/2020 in [facility] Admission (Discharged) from [facility]/2020 [facility]  C-SSRS RISK CATEGORY Moderate Risk High Risk No Risk        Collaboration of Care: none  A total of 30 minutes was spent involved in face to face clinical care, chart review, documentation.   Marilou Showman, MD 03/02/2024, 8:33 AM

## 2024-03-03 LAB — CBC WITH DIFFERENTIAL/PLATELET
Basophils Absolute: 0 10*3/uL (ref 0.0–0.2)
Basos: 1 %
EOS (ABSOLUTE): 0.2 10*3/uL (ref 0.0–0.4)
Eos: 3 %
Hematocrit: 40 % (ref 34.0–46.6)
Hemoglobin: 12.7 g/dL (ref 11.1–15.9)
Immature Grans (Abs): 0 10*3/uL (ref 0.0–0.1)
Immature Granulocytes: 0 %
Lymphocytes Absolute: 2.4 10*3/uL (ref 0.7–3.1)
Lymphs: 40 %
MCH: 28.7 pg (ref 26.6–33.0)
MCHC: 31.8 g/dL (ref 31.5–35.7)
MCV: 91 fL (ref 79–97)
Monocytes Absolute: 0.4 10*3/uL (ref 0.1–0.9)
Monocytes: 6 %
Neutrophils Absolute: 2.9 10*3/uL (ref 1.4–7.0)
Neutrophils: 50 %
Platelets: 356 10*3/uL (ref 150–450)
RBC: 4.42 x10E6/uL (ref 3.77–5.28)
RDW: 13.7 % (ref 11.7–15.4)
WBC: 5.9 10*3/uL (ref 3.4–10.8)

## 2024-03-03 LAB — LIPID PANEL
Chol/HDL Ratio: 5.7 ratio — ABNORMAL HIGH (ref 0.0–4.4)
Cholesterol, Total: 211 mg/dL — ABNORMAL HIGH (ref 100–199)
HDL: 37 mg/dL — ABNORMAL LOW (ref >39)
LDL Chol Calc (NIH): 134 mg/dL — ABNORMAL HIGH (ref 0–99)
Triglycerides: 220 mg/dL — ABNORMAL HIGH (ref 0–149)
VLDL Cholesterol Cal: 40 mg/dL (ref 5–40)

## 2024-03-03 LAB — HIV ANTIBODY (ROUTINE TESTING W REFLEX): HIV Screen 4th Generation wRfx: NONREACTIVE

## 2024-03-03 LAB — HCV AB W REFLEX TO QUANT PCR: HCV Ab: NONREACTIVE

## 2024-03-03 LAB — TSH+FREE T4
Free T4: 1.11 ng/dL (ref 0.82–1.77)
TSH: 4.91 u[IU]/mL — ABNORMAL HIGH (ref 0.450–4.500)

## 2024-03-03 LAB — HCV INTERPRETATION

## 2024-03-06 ENCOUNTER — Ambulatory Visit (INDEPENDENT_AMBULATORY_CARE_PROVIDER_SITE_OTHER): Payer: Self-pay | Admitting: Primary Care

## 2024-03-06 MED ORDER — ATORVASTATIN CALCIUM 80 MG PO TABS: 80.0000 mg | ORAL_TABLET | Freq: Every day | ORAL | 1 refills | Status: DC

## 2024-03-09 ENCOUNTER — Other Ambulatory Visit (HOSPITAL_COMMUNITY): Payer: Self-pay

## 2024-03-10 ENCOUNTER — Ambulatory Visit (HOSPITAL_COMMUNITY): Payer: MEDICAID | Admitting: Student

## 2024-03-12 ENCOUNTER — Ambulatory Visit (INDEPENDENT_AMBULATORY_CARE_PROVIDER_SITE_OTHER): Payer: MEDICAID | Admitting: Student

## 2024-03-12 DIAGNOSIS — F203 Undifferentiated schizophrenia: Secondary | ICD-10-CM

## 2024-03-15 NOTE — Progress Notes (Signed)
 [facility] [address] [place] [address] Dept: [phone] Dept Fax: [phone]  Psychotherapy Progress Note  Patient ID: Eileen Rivas, female  DOB: 2001-02-19, 23 y.o.  MRN: [id number]  5/22/22025 Start time: 10 AM End time: 10:45 AM  Method of Visit: video visit  Present: patient, provider  Current Concerns: reality testing of delusional thought content  Current Symptoms: Compulsive behaviors, avoidance behaviors  Psychiatric Specialty Exam:     General Appearance: Fairly Groomed  Eye Contact:  Good  Speech:  Clear and Coherent  Volume:  Normal  Mood:  "okay"  Affect:  constricted  Thought Process:  Coherent  Orientation:  Full (Time, Place, and Person)  Thought Content: Logical   Suicidal Thoughts:  No  Homicidal Thoughts:  No  Memory:  Immediate;   Good  Judgement:  fair  Insight:  poor  Psychomotor Activity:  Normal  Concentration:  Concentration: Good  Recall:  Good  Fund of Knowledge: Good  Language: Good  Akathisia:  No  Handed:  not assessed  AIMS (if indicated): not done  Assets:  Communication Skills Desire for Improvement Financial Resources/Insurance Housing Leisure Time Physical Health  ADL's:  Intact  Cognition: WNL  Sleep:  Fair    Diagnosis: Obsessive-compulsive disorder   Anticipated Frequency of Visits: Once weekly Anticipated Length of Treatment Episode: To be determined  Short Term Goals/Goals for Treatment Session: Patient reports worries about artificial intelligence chips being implanted into computers and used for nefarious purposes. Reality testing algorithm previously developed (acronym BLEP-C) was ineffective today. She is always amenable to correction and eventually agrees the delusional belief is unfounded.   The patient remains appropriate for the outpatient level of care based on solid social support and lack of overt psychosis. She is able to manage  discussion of her delusional thought content to appropriate social settings indicating some degree of implicit reality testing.   Progress Towards Goals: Initial  Treatment Intervention: Exposure response prevention   09/27/23 1411  Anxiety Long Term Goals  Anxiety Long Term Goals Stabilize anxiety level while increasing ability to function on a daily basis  Anxiety Short Term Goals  Anxiety Short Term Goals Identify an anxiety coping mechanism that has been successful in the past and increase its use  Acknowledge irrational nature of the fears Assist the client in developing an awareness of the irrational nature of his/her fears  Anxiety Treatment Plan Status  Anxiety Treatment Plan Status Progressing, as evidenced by patient being able to name a coping strategy to help with the anxiety that comes with daily chores, as well as the patient responding positively to "weighing the evidence" approaches,.       Medical Necessity: Improved patient condition  Assessment Tools:    02/18/2024    9:32 AM 08/20/2023    1:52 PM 07/27/2023    8:00 AM  Depression screen PHQ 2/9  Decreased Interest 2 3 3   Down, Depressed, Hopeless 1 2 3   PHQ - 2 Score 3 5 6   Altered sleeping 1 3 2   Tired, decreased energy 2 2 3   Change in appetite 3 3 2   Feeling bad or failure about yourself  2 2 1   Trouble concentrating 1 2 2   Moving slowly or fidgety/restless 2 3 2   Suicidal thoughts 0 0 1  PHQ-9 Score 14 20 19   Difficult doing work/chores Somewhat difficult Very difficult     Failed to redirect to the Timeline version of the REVFS SmartLink. Flowsheet Row  UC from 12/31/2023 in [facility] ED from 11/08/2020 in [facility] (Discharged[facility]  C-SSRS RISK CATEGORY Moderate Risk High Risk No Risk         Collaboration of Care: none  Patient/Guardian was advised  Release of Information must be obtained prior to any record release in order to collaborate their care with an outside provider. Patient/Guardian was advised if they have not already done so to contact the registration department to sign all necessary forms in order for us  to release information regarding their care.   Consent: Patient/Guardian gives verbal consent for treatment and assignment of benefits for services provided during this visit. Patient/Guardian expressed understanding and agreed to proceed.   Plan:  Continue exposure therapy, addressing compulsive behaviors (handwashing) as well as avoidance behaviors (not handling trash) Address cognitive distortions surrounding self worth and familial conflict Continue to work on reality testing regarding possible delusional thoughts  Marilou Showman, MD 03/15/2024

## 2024-03-19 ENCOUNTER — Ambulatory Visit (INDEPENDENT_AMBULATORY_CARE_PROVIDER_SITE_OTHER): Payer: MEDICAID | Admitting: Student

## 2024-03-19 DIAGNOSIS — F203 Undifferentiated schizophrenia: Secondary | ICD-10-CM

## 2024-03-19 DIAGNOSIS — F422 Mixed obsessional thoughts and acts: Secondary | ICD-10-CM

## 2024-03-19 MED ORDER — OLANZAPINE 5 MG PO TABS: 5.0000 mg | ORAL_TABLET | Freq: Every day | ORAL | 1 refills | Status: DC

## 2024-03-19 MED ORDER — FLUOXETINE HCL 40 MG PO CAPS: 40.0000 mg | ORAL_CAPSULE | Freq: Every day | ORAL | 2 refills | Status: DC

## 2024-03-19 MED ORDER — FLUOXETINE HCL 20 MG PO CAPS
20.0000 mg | ORAL_CAPSULE | Freq: Every day | ORAL | 3 refills | Status: DC
Start: 2024-03-19 — End: 2024-06-11

## 2024-03-23 NOTE — Progress Notes (Signed)
 [facility] [address] [place] [address] Dept: [phone] Dept Fax: [phone]  Psychotherapy Progress Note  Patient ID: Eileen Rivas, female  DOB: [birth year], 23 y.o.  MRN: [id number]  5/22/22025 Start time: 10 AM End time: 10:45 AM  Method of Visit: video visit  Present: patient, provider  Current Concerns: reality testing of delusional thought content  Current Symptoms: Compulsive behaviors, avoidance behaviors  Psychiatric Specialty Exam:     General Appearance: Fairly Groomed  Eye Contact:  Good  Speech:  Clear and Coherent  Volume:  Normal  Mood:  "okay"  Affect:  constricted  Thought Process:  Coherent  Orientation:  Full (Time, Place, and Person)  Thought Content: Logical   Suicidal Thoughts:  No  Homicidal Thoughts:  No  Memory:  Immediate;   Good  Judgement:  fair  Insight:  poor  Psychomotor Activity:  Normal  Concentration:  Concentration: Good  Recall:  Good  Fund of Knowledge: Good  Language: Good  Akathisia:  No  Handed:  not assessed  AIMS (if indicated): not done  Assets:  Communication Skills Desire for Improvement Financial Resources/Insurance Housing Leisure Time Physical Health  ADL's:  Intact  Cognition: WNL  Sleep:  Fair    Diagnosis: Obsessive-compulsive disorder   Anticipated Frequency of Visits: Once weekly Anticipated Length of Treatment Episode: To be determined  Short Term Goals/Goals for Treatment Session: Much of today's session was devoted to psychoeducation regarding schizophrenia.  The patient and her mother report they have not picked up her psychiatric medication.  We talked at length about how important this is.  Discussed transition of care to new therapist, Dr. Adine Ahmadi.  Progress Towards Goals: Initial  Treatment Intervention: Exposure response prevention   09/27/23 1411  Anxiety Long Term Goals  Anxiety Long Term Goals Stabilize anxiety level  while increasing ability to function on a daily basis  Anxiety Short Term Goals  Anxiety Short Term Goals Identify an anxiety coping mechanism that has been successful in the past and increase its use  Acknowledge irrational nature of the fears Assist the client in developing an awareness of the irrational nature of his/her fears  Anxiety Treatment Plan Status  Anxiety Treatment Plan Status Progressing, as evidenced by patient being able to name a coping strategy to help with the anxiety that comes with daily chores, as well as the patient responding positively to "weighing the evidence" approaches,.       Medical Necessity: Improved patient condition  Assessment Tools:    02/18/2024    9:32 AM 08/20/2023    1:52 PM 07/27/2023    8:00 AM  Depression screen PHQ 2/9  Decreased Interest 2 3 3   Down, Depressed, Hopeless 1 2 3   PHQ - 2 Score 3 5 6   Altered sleeping 1 3 2   Tired, decreased energy 2 2 3   Change in appetite 3 3 2   Feeling bad or failure about yourself  2 2 1   Trouble concentrating 1 2 2   Moving slowly or fidgety/restless 2 3 2   Suicidal thoughts 0 0 1  PHQ-9 Score 14 20 19   Difficult doing work/chores Somewhat difficult Very difficult     Failed to redirect to the Timeline version of the REVFS SmartLink. Flowsheet Row UC from 12/31/2023 in [facility] at [facility]) ED from 11/08/2020 in [facility] Ad[facility] (Discharged) [facility]  C-SSRS RISK CATEGORY Moderate Risk High Risk  No Risk         Collaboration of Care: none  Patient/Guardian was advised Release of Information must be obtained prior to any record release in order to collaborate their care with an outside provider. Patient/Guardian was advised if they have not already done so to contact the registration department to sign all necessary forms in order for us  to release information regarding their  care.   Consent: Patient/Guardian gives verbal consent for treatment and assignment of benefits for services provided during this visit. Patient/Guardian expressed understanding and agreed to proceed.   Plan:  Continue to work on reality testing regarding possible delusional thoughts Continue exposure therapy, addressing compulsive behaviors (handwashing) as well as avoidance behaviors (not handling trash) Address cognitive distortions surrounding self worth and familial conflict Marilou Showman, MD 03/23/2024

## 2024-03-25 NOTE — Progress Notes (Unsigned)
 [facility] [address] [place] [address] Dept: [phone] Dept Fax: [phone]  Psychotherapy Progress Note  Patient ID: Eileen Rivas, female  DOB: [birth year], 23 y.o.  MRN: [id number]  03/26/2024 Start time: 10 AM End time: 10:45 AM  Method of Visit: video visit  Present: patient, provider  Current Concerns: reality testing of delusional thought content  Current Symptoms: Compulsive behaviors, avoidance behaviors  Psychiatric Specialty Exam:     General Appearance: Fairly Groomed  Eye Contact:  Good  Speech:  Clear and Coherent  Volume:  Normal  Mood:  "okay"  Affect:  constricted  Thought Process:  Coherent  Orientation:  Full (Time, Place, and Person)  Thought Content: Logical   Suicidal Thoughts:  No  Homicidal Thoughts:  No  Memory:  Immediate;   Good  Judgement:  fair  Insight:  poor  Psychomotor Activity:  Normal  Concentration:  Concentration: Good  Recall:  Good  Fund of Knowledge: Good  Language: Good  Akathisia:  No  Handed:  not assessed  AIMS (if indicated): not done  Assets:  Communication Skills Desire for Improvement Financial Resources/Insurance Housing Leisure Time Physical Health  ADL's:  Intact  Cognition: WNL  Sleep:  Fair    Diagnosis: Obsessive-compulsive disorder   Anticipated Frequency of Visits: Once weekly Anticipated Length of Treatment Episode: To be determined  Short Term Goals/Goals for Treatment Session:  Case conceptualization: The patient had 2 psychiatric hospitalizations about 3 years ago for psychotic symptoms, and she was given the diagnosis of schizophrenia.  Patient began receiving care from this provider approximately 2 years ago.  Psychotic symptoms and delusional thought content appeared minimal.  It seems that monthly Abilify  Maintena did quite well for the patient.  She was also on high dose Prozac  for OCD symptoms.    When I started seeing the  patient, she did have significant obsessive thoughts and compulsive behaviors.  Her obsessions centered around cleanliness and fears of contamination and deadly sickness; her compulsions consisted of lengthy handwashing (about 20 minutes at a time, several times per day).  She was very avoidant of tasks involving potential contamination such as taking the trash out or doing the dishes.  This obviously caused significant functional impairment.  At one point during our time together the patient tried to start community college, but had to drop out after 1 semester.  It seems that she was unable to complete assignments due to preoccupations with thoughts related to OCD and schizophrenia.  Beginning approximately 6 months prior to today, the patient began to spontaneously report more delusional thought content.  For example, she would talk about how worried she is that some dog owners put tracking devices on their dogs collars.  She recently has talked about worries that the government thinks that she is stealing videos off of [employer].  She has had continued difficulties with reality testing, often having difficulty assessing what is factual and what is delusional.  For several months we worked on an algorithm to help her with this reality testing.  This was not successful.  At the same time we were pursuing the psychotherapeutic interventions for her psychotic symptoms, we pursued medication management strategies.  We would classify her schizophrenia as treatment resistant based on failure of previously tried risperidone and now Abilify .  It was felt that starting the patient on a second antipsychotic was a better option then starting clozapine: the patient and her mother have had difficulty with medication adherence and  have missed appointments.  Please see recent data showing negligible harms of antipsychotic polypharmacy and significant potential benefits (e.g. Tiihonen 2019 in [place]).  She was prescribed Zyprexa   several weeks prior to this appointment.  She only started taking the medication a few days ago.  First generation antipsychotic medication was avoided due to previous hyperprolactinemia on risperidone.  The patient has gained a large amount of weight while on second-generation antipsychotics.  Baseline weight of around 130 pounds before 2022.  Most recently recorded weight was 187 pounds 1 month ago.  The patient has talked about how this has negatively impacted her.  Metformin  was started along with Zyprexa  to mitigate antipsychotic induced weight gain. The patient continues to receive Abilify  Maintena on a monthly basis, confirmed by chart review.  She is scheduled for another LAI in early July.    She is scheduled for therapy follow-up with Dr. Adine Ahmadi on a biweekly basis from 7/3 until November.  This resident physician will also manage the medications.  The patient and her mother were asked to come in person for the appointment on 7/3 even though it is at 8 AM, which is difficult for the patient and her mother.  They agreed that they would do that.  Starting in August, appointments will occur at 1 PM on Thursdays, which should work well for the patient and her mother.  Notes on visit today: Significant time spent discussing future direction of care.  We discussed her diagnosis of schizophrenia and how important medication adherence is.  We discussed that the natural course of schizophrenia is functional decline over time unless it is well treated with medication.  We reflected on the patient's journey over the past 2 years with myself as her medication provider and therapist, though this was limited somewhat by the patient's poor insight.  We discussed her goals for the future.  The patient understands the transition plan.  Progress Towards Goals: Initial  Treatment Intervention: CBT for psychosis  Medical Necessity: Improved patient condition  Assessment Tools:    02/18/2024    9:32 AM  08/20/2023    1:52 PM 07/27/2023    8:00 AM  Depression screen PHQ 2/9  Decreased Interest 2 3 3   Down, Depressed, Hopeless 1 2 3   PHQ - 2 Score 3 5 6   Altered sleeping 1 3 2   Tired, decreased energy 2 2 3   Change in appetite 3 3 2   Feeling bad or failure about yourself  2 2 1   Trouble concentrating 1 2 2   Moving slowly or fidgety/restless 2 3 2   Suicidal thoughts 0 0 1  PHQ-9 Score 14 20 19   Difficult doing work/chores Somewhat difficult Very difficult     Failed to redirect to the Timeline version of the REVFS SmartLink. Flowsheet Row UC from 12/31/2023 in [facility]) ED from 11/08/2020 in [facility] Admission (Discharged) [facility]  C-SSRS RISK CATEGORY Moderate Risk High Risk No Risk         Collaboration of Care: none  Patient/Guardian was advised Release of Information must be obtained prior to any record release in order to collaborate their care with an outside provider. Patient/Guardian was advised if they have not already done so to contact the registration department to sign all necessary forms in order for us  to release information regarding their care.   Consent: Patient/Guardian gives verbal consent for treatment and assignment of benefits for  services provided during this visit. Patient/Guardian expressed understanding and agreed to proceed.   Plan:  Further psychotherapy and medication management per next resident physician.   Marilou Showman, MD 03/25/2024

## 2024-03-26 ENCOUNTER — Ambulatory Visit (INDEPENDENT_AMBULATORY_CARE_PROVIDER_SITE_OTHER): Payer: MEDICAID | Admitting: Student

## 2024-03-26 ENCOUNTER — Encounter (HOSPITAL_COMMUNITY): Payer: Self-pay

## 2024-03-26 ENCOUNTER — Ambulatory Visit (HOSPITAL_COMMUNITY): Payer: MEDICAID

## 2024-03-26 VITALS — BP 99/63 | HR 72 | Wt 191.6 lb

## 2024-03-26 DIAGNOSIS — F200 Paranoid schizophrenia: Secondary | ICD-10-CM

## 2024-03-26 DIAGNOSIS — F411 Generalized anxiety disorder: Secondary | ICD-10-CM | POA: Diagnosis not present

## 2024-03-26 DIAGNOSIS — G4700 Insomnia, unspecified: Secondary | ICD-10-CM

## 2024-03-26 MED ORDER — ARIPIPRAZOLE ER 400 MG IM PRSY
400.0000 mg | PREFILLED_SYRINGE | Freq: Once | INTRAMUSCULAR | Status: AC
  Administered 2024-03-26: 400 mg via INTRAMUSCULAR

## 2024-03-26 NOTE — Progress Notes (Signed)
 Patient arrived for Abilify 400 mg  Injection . Tolerated Injection Well in left Arm Pleasant not Talkative. But smiles & will Speak when necessary . NO HI/SI  NOR AH/VH

## 2024-04-22 NOTE — Progress Notes (Cosign Needed Addendum)
 [facility] [address] [place] [address] Dept: [phone] Dept Fax: [phone]  Psychotherapy Progress Note  Patient ID: Eileen Rivas, female  DOB: [birth year], 23 y.o.  MRN: [id number]  Date: 04/23/24  Start time: 8:00 AM End time: 9:00 AM  Method of Visit: in-person Present: patient, provider  Current Concerns:  reality testing of delusional thought content religious/scrupulosity-themed obsessions (fear of mark of the beast, AI tracking, going to hell). Poor insight into the irrational nature of obsessions and the negative impact of a reinforcing friendship. Medication nonadherence (missed doses of fluoxetine ; stopped Lipitor without medical guidance)  Current Symptoms: Compulsive behaviors, avoidance behaviors  Psychiatric Specialty Exam:     General Appearance: Fairly Groomed  Eye Contact:  Good  Speech:  Clear and Coherent  Volume:  Normal  Mood:  Sad  Affect:  flat  Thought Process:  Coherent  Orientation:  Full (Time, Place, and Person)  Thought Content: Logical   Suicidal Thoughts:  No  Homicidal Thoughts:  No  Memory:  Immediate;   Good  Judgement:  fair  Insight:  limited  Psychomotor Activity:  Normal  Concentration:  Concentration: Good  Recall:  Good  Fund of Knowledge: Good  Language: Good  Akathisia:  No  Handed:  not assessed  AIMS (if indicated): not done  Assets:  Communication Skills Desire for Improvement Financial Resources/Insurance Housing Leisure Time Physical Health  ADL's:  Intact  Cognition: WNL  Sleep:  Fair    Diagnosis:  Obsessive-compulsive disorder  Anticipated Frequency of Visits: Biweekly until Novemeber Anticipated Length of Treatment Episode: To be determined  Short Term Goals/Goals for Treatment Session:  Case conceptualization: The patient presents with a longstanding history of OCD and schizophrenia currently marked by both contamination  related obsessions and religious/scrupulosity themes.  When asked to explore her thoughts before, during, and after engaging in compulsions such as extended handwashing, she now describes feeling calmer than in the past. She notes that her prior fears were more severe, involving a preoccupation with deadly germs, whereas her current thoughts are more grounded in general discomfort with contamination. This suggests some progress in cognitive reframing and reduction in distress intensity.While her contamination concerns have improved with fluoxetine  60 mg, residual behaviors remain, such as the continued use of gloves to prevent perceived contamination.   More recently, her distress has shifted toward intrusive, religiously charged thoughts centered on the mark of the beast and a fear of AI.  She is particularly preoccupied with the idea that a chip in her Alexa device may be tracking her or linked to larger spiritual threats.Her insight is further complicated by a high school friend who reinforces these fears by discussing topics such as the Satanic Bible. The patient appears to lack awareness of the negative impact this friend has on her distress level and reports the relationship as supportive, despite noting that the friend is often disrespectful.  Emotionally, the patient reports intense fear related to the possibility of going to hell and profound sadness over the limitations her illness has imposed on her life.  Medication adherence has been a significant challenge. The patient admits to missing 3-4 doses of fluoxetine  recently and previously stopped taking Lipitor due to concerns about potential birth defects, despite not being pregnant. She has agreed to begin using a calendar to check off her medications daily and bring it to the next session. Her mother also assists with adherence, though the patient struggles to consistently bring her medications to  her mother for administration.  Current meds:   Prozac  60 mg daily  Abilify  maintenna 400 mg q28 days   Notes on visit today: Significant time spent reviewing current obsessions, compulsions, and barriers to treatment progress. Patient described religious-themed obsessions involving AI and the mark of the beast, with associated avoidance behaviors and ongoing fear of damnation. She also acknowledged sadness about missed life experiences due to OCD. While she demonstrates partial insight, she continues to seek validation from a friend who reinforces her fears. Medication adherence remains poor, with recent missed doses of fluoxetine  and discontinuation of Lipitor due to pregnancy concerns despite no sexual activity. We discussed the role of SSRIs in OCD treatment, reinforced the importance of consistency, and agreed on a calendar-based adherence strategy. ERP principles were reviewed, and patient expressed openness re-engaging with journaling. She understands the treatment plan, including bringing her calendar to the next visit and evaluating the impact of reinforcing relationships.  Progress Towards Goals: Initial  Treatment Intervention: CBT for psychosis  Medical Necessity: Improved patient condition  Assessment Tools:    04/23/2024    9:16 AM 02/18/2024    9:32 AM 08/20/2023    1:52 PM  Depression screen PHQ 2/9  Decreased Interest 2 2 3   Down, Depressed, Hopeless 3 1 2   PHQ - 2 Score 5 3 5   Altered sleeping 3 1 3   Tired, decreased energy 1 2 2   Change in appetite 3 3 3   Feeling bad or failure about yourself  0 2 2  Trouble concentrating 2 1 2   Moving slowly or fidgety/restless 3 2 3   Suicidal thoughts 0 0 0  PHQ-9 Score 17 14 20   Difficult doing work/chores Somewhat difficult Somewhat difficult Very difficult    Failed to redirect to the Timeline version of the REVFS SmartLink. Flowsheet Row UC from 12/31/2023 in [facility]) ED from 11/08/2020 in [facility] Admission (Discharged) [facility]  C-SSRS RISK CATEGORY Moderate Risk High Risk No Risk    Collaboration of Care: none  Patient/Guardian was advised Release of Information must be obtained prior to any record release in order to collaborate their care with an outside provider. Patient/Guardian was advised if they have not already done so to contact the registration department to sign all necessary forms in order for us  to release information regarding their care.   Consent: Patient/Guardian gives verbal consent for treatment and assignment of benefits for services provided during this visit. Patient/Guardian expressed understanding and agreed to proceed.   Plan:  Further psychotherapy and medication management per next resident physician.   Marlo Masson, MD 04/23/2024

## 2024-04-23 ENCOUNTER — Encounter (HOSPITAL_COMMUNITY): Payer: Self-pay | Admitting: Student in an Organized Health Care Education/Training Program

## 2024-04-23 ENCOUNTER — Ambulatory Visit (HOSPITAL_COMMUNITY): Payer: MEDICAID | Admitting: Student in an Organized Health Care Education/Training Program

## 2024-04-23 ENCOUNTER — Ambulatory Visit (HOSPITAL_COMMUNITY): Payer: MEDICAID

## 2024-04-23 VITALS — BP 104/69 | HR 77 | Wt 196.0 lb

## 2024-04-23 VITALS — BP 106/70 | HR 70 | Ht 61.5 in | Wt 195.0 lb

## 2024-04-23 DIAGNOSIS — F429 Obsessive-compulsive disorder, unspecified: Secondary | ICD-10-CM

## 2024-04-23 DIAGNOSIS — F203 Undifferentiated schizophrenia: Secondary | ICD-10-CM

## 2024-04-23 MED ORDER — ARIPIPRAZOLE ER 400 MG IM PRSY
400.0000 mg | PREFILLED_SYRINGE | Freq: Once | INTRAMUSCULAR | Status: AC
  Administered 2024-04-23: 400 mg via INTRAMUSCULAR

## 2024-04-23 NOTE — Progress Notes (Cosign Needed)
 Pt presents today for injection of Abilify  maintenna 400mg , administered in right deltoid.Pt denies any concerns for pain or issues.    JNL, CMA    Pt states that she is hearing voices but denies  SI, and HI. However. Pt does state that she is having an increase in her appetite and feels hungry more than usual.

## 2024-05-06 NOTE — Progress Notes (Addendum)
 [facility] [place] [facility] [address] [place] [address] Dept: [phone] Dept Fax: [phone]  Psychotherapy Progress Note  Patient ID: Eileen Rivas, female  DOB: 11-Jun-2001, 23 y.o.  MRN: [id number]  05/07/2024 Start time: 8:00 AM End time: 9:00 AM  Method of Visit: Video: Virtual Visit via Video Encounter:  I connected with Eileen Rivas on 05/08/24 at  8:00 AM EDT by a video enabled telemedicine application and verified that I am speaking with the correct person using two identifiers.   Location: Patient: In her room Provider: Office    I discussed the limitations of evaluation and management by telemedicine and the availability of in person appointments. The patient expressed understanding and agreed to proceed.  I discussed the assessment and treatment plan with the patient. The patient was provided an opportunity to ask questions and all were answered. The patient agreed with the plan and demonstrated an understanding of the instructions.   The patient was advised to call back or seek an in-person evaluation if the symptoms worsen or if the condition fails to improve as anticipated.  I provided 60 minutes of non-face-to-face time during this encounter.  Present: patient  I obtained collateral form the patient's mother via phone [phone]), Eileen Rivas, who was not at home at the time of the encounter.  Eileen reports she has made at arrangements at her job to be more available in the home to support the patient. She expresses concern over the patient's OCD symptoms, all questions were answered regarding nature of therapy sessions. I discussed with Eileen the importance of playing a more active role in patient's medication adherence, as compliance continues to be an issue. She verbalized understanding and reported that she would start giving the medications to the patient to ensure she took them.     Current  Concerns:  Medication adherence  OCD related Concerns Contamination obsessions Religious/scrupulosity-themed obsessions (fear of mark of the beast, AI tracking, going to hell). Schizophrenia Related Concerns Cognitive distortions -- difficulty distinguishing obsessions from delusional content Reality testing of delusional thought content   Current Symptoms: OCD and Other: psychosis related to schizophrenia diagnosis   Case Formulation: At the patient's request, today's session focused on creating more structure in treatment and addressing ongoing OCD symptoms, including contamination fears and scrupulosity. From a nomothetic perspective, these symptoms are consistent with the cognitive-behavioral model of OCD, where intrusive thoughts trigger anxiety, leading to compulsions and avoidance that provide short-term relief but reinforce the cycle of obsessions over time. The role of inflated responsibility and overestimation of threat appears relevant to this case, particularly in the context of religious and contamination fears.  From an idiographic perspective, the patient's obsessions center on fears of spiritual harm and contamination, with compulsions including avoidance of AI-related stimuli and excessive hygiene behaviors. Medication adherence continues to be problematic, with the patient missing 3-4 doses weekly despite understanding its importance and acknowledging that OCD interferes with adherence. Collaboration with her mother remains a priority, as her involvement is currently limited to reminders rather than active monitoring.  During the session, we reviewed ERP principles and introduced a structured approach using subjective units of distress (SUDS). The patient identified three current obsessions and ranked proximity to the trash can as most distressing (8/10). Religious obsessions, particularly fears about the mark of the beast, remain prominent and contribute to fixed beliefs  requiring cognitive restructuring. We will continue alternating between ERP based interventions and CBT for psychosis to address these symptoms and improve  insight. The patient expressed understandig and agreement with the plan and was assigned to complete the SUDS hierarchy for the next visit.   Psychiatric Specialty Exam: General Appearance: Limited assessment of appearance  Eye Contact:  Fair  Speech:  Clear and Coherent  Volume:  Normal  Mood:  Worried  Affect:  Flat  Thought Process:  Linear and Descriptions of Associations: Intact  Orientation:  Other:  Grossly intact  Thought Content:  Ideas of Reference:   Delusions and Obsessions  Suicidal Thoughts:  No  Homicidal Thoughts:  No  Memory:  Grossly intact  Judgement:  Fair as evidenced by ongoing commitment to attending therapy and med management appointments; poor medication adherence  Insight: Insight is limited but does demonstrate fair insight into illness and its consequences.  Maintains strong conviction specific religious theme fears and contamination of obsessions  Psychomotor Activity: Grossly normal  Concentration:  Concentration: Fair and Attention Span: Fair  Recall: Recent memory grossly intact; long-term recall from previous sessions appears limited  Fund of Knowledge:Fair  Language: Good  Akathisia:  No  Handed: Not formally assessed  AIMS (if indicated):  not done  Assets:  Desire for Improvement Housing Physical Health Resilience  ADL's:  Intact  Cognition: Grossly impaired  Sleep:  Good   Diagnosis:  Obsessive-compulsive disorder Schizophrenia  Anticipated Frequency of Visits: Every other week until November Anticipated Length of Treatment Episode: To be determined  Short Term Goals/Goals for Treatment Session:  OCD Treatment Plan Complete all homework and actively during therapy -currently SUDS Utilize coping skills taught in therapy to reduce symptoms Educate ondividual about their illness and  importance of medication adherence  Psychosis Treatment Plan Take medications as prescribed and report any side effects to appropriate professionals Help the patient develop reality based, positive cognitive messages   Progress Towards Goals: Progressing as evidenced by willingness to complete SUDS homework assignment for next session  Treatment Intervention:  Exposure and response prevention -- focus of today's appointment CBT for psychosis    Medical Necessity: Improved patient condition  Assessment Tools:    04/23/2024    9:16 AM 02/18/2024    9:32 AM 08/20/2023    1:52 PM  Depression screen PHQ 2/9  Decreased Interest 2 2 3   Down, Depressed, Hopeless 3 1 2   PHQ - 2 Score 5 3 5   Altered sleeping 3 1 3   Tired, decreased energy 1 2 2   Change in appetite 3 3 3   Feeling bad or failure about yourself  0 2 2  Trouble concentrating 2 1 2   Moving slowly or fidgety/restless 3 2 3   Suicidal thoughts 0 0 0  PHQ-9 Score 17 14 20   Difficult doing work/chores Somewhat difficult Somewhat difficult Very difficult   Failed to redirect to the Timeline version of the REVFS SmartLink. Flowsheet Row UC from 12/31/2023 in [facility] ED from 11/08/2020 in [facility]Dis[facility]  C-SSRS RISK CATEGORY Moderate Risk High Risk No Risk    Collaboration of Care: none  Patient/Guardian was advised Release of Information must be obtained prior to any record release in order to collaborate their care with an outside provider. Patient/Guardian was advised if they have not already done so to contact the registration department to sign all necessary forms in order for us  to release information regarding their care.   Consent: Patient/Guardian gives verbal consent for treatment and assignment of benefits for  services provided during this visit. Patient/Guardian  expressed understanding and agreed to proceed.     Plan:  Continue fluoxetine  60 mg Continue abilify  maintena 40 mg q28 days (last administered 04/23/2024) Attempt Calendar-based medication adherence with the help of patient's mother Complete SUDS assignment and discuss at next visit Alternate ERP for OCD and CBT for psychosis  Marlo Masson, MD 05/08/2024

## 2024-05-07 ENCOUNTER — Ambulatory Visit (INDEPENDENT_AMBULATORY_CARE_PROVIDER_SITE_OTHER): Payer: MEDICAID | Admitting: Student in an Organized Health Care Education/Training Program

## 2024-05-07 DIAGNOSIS — F429 Obsessive-compulsive disorder, unspecified: Secondary | ICD-10-CM

## 2024-05-07 DIAGNOSIS — F203 Undifferentiated schizophrenia: Secondary | ICD-10-CM | POA: Diagnosis not present

## 2024-05-19 ENCOUNTER — Telehealth (HOSPITAL_COMMUNITY): Payer: Self-pay

## 2024-05-19 DIAGNOSIS — F203 Undifferentiated schizophrenia: Secondary | ICD-10-CM

## 2024-05-19 MED ORDER — OLANZAPINE 5 MG PO TABS: 5.0000 mg | ORAL_TABLET | Freq: Every day | ORAL | 2 refills | Status: DC

## 2024-05-19 NOTE — Telephone Encounter (Signed)
 Refill sent. Thanks

## 2024-05-19 NOTE — Telephone Encounter (Signed)
 [facility] faxed over a request for Olanzapine  5 mg to be refilled.  Will send to provider to for consideration

## 2024-05-20 NOTE — Progress Notes (Unsigned)
 [facility] [address] [place] [address] Dept: [phone] Dept Fax: [phone]  Psychotherapy Progress Note  Patient ID: Eileen Rivas, female  DOB: Jan 28, 2001, 23 y.o.  MRN: [id number]  05/07/2024 Start time: 8:00 AM End time: 9:00 AM  Method of Visit:Virtual Visit via Video Encounter  I connected with Eileen Rivas on 05/20/24 at  8:00 AM EDT by a video enabled telemedicine application and verified that I am speaking with the correct person using two identifiers.   Location: Patient: In her room Provider: Office    I discussed the limitations of evaluation and management by telemedicine and the availability of in person appointments. The patient expressed understanding and agreed to proceed.  I discussed the assessment and treatment plan with the patient. The patient was provided an opportunity to ask questions and all were answered. The patient agreed with the plan and demonstrated an understanding of the instructions.   The patient was advised to call back or seek an in-person evaluation if the symptoms worsen or if the condition fails to improve as anticipated.  I provided 60 minutes of non-face-to-face time during this encounter.  Present: patient; Dr. Ezzard and myself were present in the office.   I obtained collateral form the patient's mother via phone [phone]), Eileen Rivas, who was not at home at the time of the encounter.  Eileen reports she has made at arrangements at her job to be more available in the home to support the patient. She expresses concern over the patient's OCD symptoms, all questions were answered regarding nature of therapy sessions. I discussed with Eileen the importance of playing a more active role in patient's medication adherence, as compliance continues to be an issue. She verbalized understanding and reported that she would start giving the medications to the patient  to ensure she took them.     Current Concerns:  Medication adherence  Schizophrenia Related Concerns Cognitive distortions -- difficulty distinguishing obsessions from delusional content Reality testing of delusional thought content Psychoeducation on med compliance OCD related Concerns Contamination obsessions Religious/scrupulosity-themed obsessions (fear of mark of the beast, AI tracking, going to hell).   Current Symptoms: OCD and Other: psychosis related to schizophrenia diagnosis   The patient reported experiencing an episode the night prior in which she was unsure whether she was having a hallucination or intrusive negative thoughts questioning her faith in God. This left her feeling anxious and scared. She noted that this episode occurred after missing a dose of olanzapine . We discussed her ongoing nonadherence to olanzapine , which she attributed to disorganized evening routines and illogical thought patterns that interfere with her ability to follow through. She was able to reflect on how past periods of adherence have lead to improvements in her hallucinations. Time was spent reviewing the patient's understanding of her schizophrenia diagnosis. She insightfully identified the genetic component of the illness and was able to appropriately summarize information about how schizophrenia affects brain chemistry, including the role of dopamine dysregulation in hallucinations and disorganized thinking. Psychoeducation was provided on how antipsychotic medications such as olanzapine  and Abilify  help manage symptoms. The patient was able to read back this information and articulate the role of medication in her recovery.  To support medication adherence, we collaboratively created a medication card with personalized affirmations for the patient to read daily. She agreed to use her Alexa device to set reminders for both taking her medication and reading the card. A physical copy of the card will  be  provided at the next visit. "I don't have to wait until I'm not scared, to take my medications. I'm just going to take it" "When I take my medications regularly, my hallucinations go away" "My medications help my thinking be clearer" "My medications help my anxiety" "If I have a side effect, I should still take them and wait to discuss them with Dr. JAYSON" "Taking my medications will get me closer to the life I want to live" "In feeding my compulsions and avoiding my fears, my anxiety will become worse" "It's ok to do nice things for myself, and treat myself well"  We briefly revisited her previous ERP homework involving subjective units of distress (SUDS). While she could not locate her written hierarchy, she was able to recall high-distress situations, including contamination fears following an incident where her sister vomited in the bathroom, and persistent distress related to a recycling bin in the kitchen. Near the end of session, the patient disclosed engaging in self-harm behaviors, including scratching herself, as a way to relieve distress caused by intrusive thoughts. We discussed how these thoughts are part of her OCD, and she was encouraged to "talk back" to her OCD and begin planning to reduce self-harm, including wearing long sleeves as a preventive measure. Further harm-reduction strategies will be addressed in the next session. Throughout the session, the patient demonstrated significant insight into the irrational nature of her thoughts, and openly reflected on the emotional and cognitive challenges she faces in maintaining medication adherence and managing her symptoms.   Psychiatric Specialty Exam: General Appearance: Limited assessment of appearance  Eye Contact:  Fair  Speech:  Clear and Coherent  Volume:  Normal  Mood:  Anxious  Affect: Overall flat, noted some subtle mood reactivity at the start of the session with patient starting to smile.   Thought Process:  Linear and  Descriptions of Associations: Intact  Orientation:  Other:  Grossly intact  Thought Content:  Ideas of Reference:   Delusions and Obsessions  subjective report of auditory hallucinations vs internal monologue  Suicidal Thoughts:  No  Homicidal Thoughts:  No  Memory:  Grossly intact  Judgement:  Fair as evidenced by ongoing commitment to attending therapy and med management appointments; poor medication adherence  Insight: Partial to good. Patient demonstrates increasing awareness of illness, recognizes the irrational nature of certain thoughts, and articulates understanding of how antipsychotic medications target symptoms of schizophrenia.  Psychomotor Activity: Grossly normal  Concentration:  Concentration: Fair and Attention Span: Fair  Recall: Recent memory grossly intact; long-term recall from previous sessions appears limited  Fund of Knowledge:Fair  Language: Good  Akathisia:  No  Handed: Not formally assessed  AIMS (if indicated):  not done  Assets:  Desire for Improvement Housing Physical Health Resilience  ADL's:  Intact  Cognition: Grossly impaired  Sleep:  Good   Diagnosis:  Obsessive-compulsive disorder and Schizophrenia vs Schizophrenia with OCD features  Anticipated Frequency of Visits: Every other week until November Anticipated Length of Treatment Episode: To be determined  Short Term Goals/Goals for Treatment Session:  Psychosis Treatment Plan Take medications as prescribed and report any side effects to appropriate professionals Help the patient develop reality based, positive cognitive messages  OCD Treatment Plan Complete all homework and actively during therapy -currently SUDS Utilize coping skills taught in therapy to reduce symptoms Educate ondividual about their illness and importance of medication adherence  Progress Towards Goals: Progressing as evidenced by willingness to complete SUDS homework assignment for next session  Treatment Intervention:  Psycho education / Medication Adherence Support CBT for psychosis  ERT   Medical Necessity: Improved patient condition  Assessment Tools:    04/23/2024    9:16 AM 02/18/2024    9:32 AM 08/20/2023    1:52 PM  Depression screen PHQ 2/9  Decreased Interest 2 2 3   Down, Depressed, Hopeless 3 1 2   PHQ - 2 Score 5 3 5   Altered sleeping 3 1 3   Tired, decreased energy 1 2 2   Change in appetite 3 3 3   Feeling bad or failure about yourself  0 2 2  Trouble concentrating 2 1 2   Moving slowly or fidgety/restless 3 2 3   Suicidal thoughts 0 0 0  PHQ-9 Score 17 14 20   Difficult doing work/chores Somewhat difficult Somewhat difficult Very difficult   Failed to redirect to the Timeline version of the REVFS SmartLink. Flowsheet Row UC from 12/31/2023 in [facility]) ED from 11/08/2020 in [facility] (Discharged) [facility]  C-SSRS RISK CATEGORY Moderate Risk High Risk No Risk    Collaboration of Care: none  Patient/Guardian was advised Release of Information must be obtained prior to any record release in order to collaborate their care with an outside provider. Patient/Guardian was advised if they have not already done so to contact the registration department to sign all necessary forms in order for us  to release information regarding their care.   Consent: Patient/Guardian gives verbal consent for treatment and assignment of benefits for services provided during this visit. Patient/Guardian expressed understanding and agreed to proceed.    Plan:  Continue Abilify  Maintenna 400 mg q28 days Continue Prozac  60 mg daily Continue  500 mg daily Continue Olanzapine  5 mg nightly Continue psycho-education and medication adherence support Self harm reduction planning Alternate ERP for OCD and CBT for psychosis  Marlo Masson, MD 05/20/2024

## 2024-05-21 ENCOUNTER — Ambulatory Visit (HOSPITAL_COMMUNITY): Payer: MEDICAID

## 2024-05-21 ENCOUNTER — Ambulatory Visit (INDEPENDENT_AMBULATORY_CARE_PROVIDER_SITE_OTHER): Payer: MEDICAID | Admitting: Student in an Organized Health Care Education/Training Program

## 2024-05-21 ENCOUNTER — Ambulatory Visit (HOSPITAL_COMMUNITY): Payer: MEDICAID | Admitting: Student in an Organized Health Care Education/Training Program

## 2024-05-21 DIAGNOSIS — F209 Schizophrenia, unspecified: Secondary | ICD-10-CM | POA: Diagnosis not present

## 2024-05-21 DIAGNOSIS — F429 Obsessive-compulsive disorder, unspecified: Secondary | ICD-10-CM | POA: Diagnosis not present

## 2024-05-21 DIAGNOSIS — F200 Paranoid schizophrenia: Secondary | ICD-10-CM

## 2024-05-27 ENCOUNTER — Ambulatory Visit (INDEPENDENT_AMBULATORY_CARE_PROVIDER_SITE_OTHER): Payer: MEDICAID

## 2024-05-27 ENCOUNTER — Encounter (HOSPITAL_COMMUNITY): Payer: Self-pay

## 2024-05-27 VITALS — BP 97/71 | HR 71 | Ht 61.5 in | Wt 192.2 lb

## 2024-05-27 DIAGNOSIS — F203 Undifferentiated schizophrenia: Secondary | ICD-10-CM

## 2024-05-27 MED ORDER — ARIPIPRAZOLE ER 400 MG IM PRSY
400.0000 mg | PREFILLED_SYRINGE | Freq: Once | INTRAMUSCULAR | Status: AC
  Administered 2024-05-27: 400 mg via INTRAMUSCULAR

## 2024-05-27 NOTE — Progress Notes (Cosign Needed)
 Patient presented to office for injection Abilify  Maintena 400 mg. Patient received injection in left deltoid . Patient tolerated injection well. Patient denies any SI/ HI/ AVH at this time. Patient will return in 28 days. No complaints noted.

## 2024-05-28 ENCOUNTER — Ambulatory Visit (HOSPITAL_COMMUNITY): Payer: MEDICAID | Admitting: Student in an Organized Health Care Education/Training Program

## 2024-05-28 NOTE — Progress Notes (Unsigned)
 [facility] [address] [place] [address] Dept: [phone] Dept Fax: [phone]  Psychotherapy Progress Note  Patient ID: Eileen Rivas, female  DOB: 09-12-2001, 23 y.o.  MRN: [id number]  05/28/24  Start time: 3:30 PM End time: 4:30 PM  Method of Visit: IN PERSON Present: Patient, myself  I connected with Eileen Rivas on 05/28/24 at  3:30 PM EDT by a video enabled telemedicine application and verified that I am speaking with the correct person using two identifiers.  I provided 60 minutes of non-face-to-face time during this encounter.  Current Concerns:  Medication adherence  Schizophrenia Related Concerns Cognitive distortions -- difficulty distinguishing obsessions from delusional content Reality testing of delusional thought content Psychoeducation on med compliance OCD related Concerns Contamination obsessions Religious/scrupulosity-themed obsessions (fear of mark of the beast, AI tracking, going to hell).   Current Symptoms: OCD and Other: psychosis related to schizophrenia diagnosis   The patient reported experiencing an episode the night prior in which she was unsure whether she was having a hallucination or intrusive negative thoughts questioning her faith in God. This left her feeling anxious and scared. She noted that this episode occurred after missing a dose of olanzapine . We discussed her ongoing nonadherence to olanzapine , which she attributed to disorganized evening routines and illogical thought patterns that interfere with her ability to follow through. She was able to reflect on how past periods of adherence have lead to improvements in her hallucinations. Time was spent reviewing the patient's understanding of her schizophrenia diagnosis. She insightfully identified the genetic component of the illness and was able to appropriately summarize information about how schizophrenia affects brain  chemistry, including the role of dopamine dysregulation in hallucinations and disorganized thinking. Psychoeducation was provided on how antipsychotic medications such as olanzapine  and Abilify  help manage symptoms. The patient was able to read back this information and articulate the role of medication in her recovery.  To support medication adherence, we collaboratively created a medication card with personalized affirmations for the patient to read daily. She agreed to use her Alexa device to set reminders for both taking her medication and reading the card. A physical copy of the card will be provided at the next visit. "I don't have to wait until I'm not scared, to take my medications. I'm just going to take it" "When I take my medications regularly, my hallucinations go away" "My medications help my thinking be clearer" "My medications help my anxiety" "If I have a side effect, I should still take them and wait to discuss them with Dr. JAYSON" "Taking my medications will get me closer to the life I want to live" "In feeding my compulsions and avoiding my fears, my anxiety will become worse" "It's ok to do nice things for myself, and treat myself well"  We briefly revisited her previous ERP homework involving subjective units of distress (SUDS). While she could not locate her written hierarchy, she was able to recall high-distress situations, including contamination fears following an incident where her sister vomited in the bathroom, and persistent distress related to a recycling bin in the kitchen. Near the end of session, the patient disclosed engaging in self-harm behaviors, including scratching herself, as a way to relieve distress caused by intrusive thoughts. We discussed how these thoughts are part of her OCD, and she was encouraged to "talk back" to her OCD and begin planning to reduce self-harm, including wearing long sleeves as a preventive measure. Further harm-reduction strategies will be  addressed in the next session. Throughout the session, the patient demonstrated significant insight into the irrational nature of her thoughts, and openly reflected on the emotional and cognitive challenges she faces in maintaining medication adherence and managing her symptoms.   Psychiatric Specialty Exam: General Appearance: Limited assessment of appearance  Eye Contact:  Fair  Speech:  Clear and Coherent  Volume:  Normal  Mood:  Anxious  Affect: Overall flat, noted some subtle mood reactivity at the start of the session with patient starting to smile.   Thought Process:  Linear and Descriptions of Associations: Intact  Orientation:  Other:  Grossly intact  Thought Content:  Ideas of Reference:   Delusions and Obsessions  subjective report of auditory hallucinations vs internal monologue  Suicidal Thoughts:  No  Homicidal Thoughts:  No  Memory:  Grossly intact  Judgement:  Fair as evidenced by ongoing commitment to attending therapy and med management appointments; poor medication adherence  Insight: Partial to good. Patient demonstrates increasing awareness of illness, recognizes the irrational nature of certain thoughts, and articulates understanding of how antipsychotic medications target symptoms of schizophrenia.  Psychomotor Activity: Grossly normal  Concentration:  Concentration: Fair and Attention Span: Fair  Recall: Recent memory grossly intact; long-term recall from previous sessions appears limited  Fund of Knowledge:Fair  Language: Good  Akathisia:  No  Handed: Not formally assessed  AIMS (if indicated):  not done  Assets:  Desire for Improvement Housing Physical Health Resilience  ADL's:  Intact  Cognition: Grossly impaired  Sleep:  Good   Diagnosis:  Obsessive-compulsive disorder and Schizophrenia vs Schizophrenia with OCD features  Anticipated Frequency of Visits: Every other week until November Anticipated Length of Treatment Episode: To be  determined  Short Term Goals/Goals for Treatment Session:  Psychosis Treatment Plan Take medications as prescribed and report any side effects to appropriate professionals Help the patient develop reality based, positive cognitive messages  OCD Treatment Plan Complete all homework and actively during therapy -currently SUDS Utilize coping skills taught in therapy to reduce symptoms Educate ondividual about their illness and importance of medication adherence  Progress Towards Goals: Progressing as evidenced by willingness to complete SUDS homework assignment for next session  Treatment Intervention:  Psycho education / Medication Adherence Support CBT for psychosis  ERT   Medical Necessity: Improved patient condition  Assessment Tools:    04/23/2024    9:16 AM 02/18/2024    9:32 AM 08/20/2023    1:52 PM  Depression screen PHQ 2/9  Decreased Interest 2 2 3   Down, Depressed, Hopeless 3 1 2   PHQ - 2 Score 5 3 5   Altered sleeping 3 1 3   Tired, decreased energy 1 2 2   Change in appetite 3 3 3   Feeling bad or failure about yourself  0 2 2  Trouble concentrating 2 1 2   Moving slowly or fidgety/restless 3 2 3   Suicidal thoughts 0 0 0  PHQ-9 Score 17 14 20   Difficult doing work/chores Somewhat difficult Somewhat difficult Very difficult   Failed to redirect to the Timeline version of the REVFS SmartLink. Flowsheet Row UC from 12/31/2023 in [facility]) ED from 11/08/2020 in [facility]Discharged[facility]  C-SSRS RISK CATEGORY Moderate Risk High Risk No Risk    Collaboration of Care: none  Patient/Guardian was advised Release of Information must be obtained prior to any record release in order to collaborate their care  with an outside provider. Patient/Guardian was advised if they have not already done so to contact the registration department to  sign all necessary forms in order for us  to release information regarding their care.   Consent: Patient/Guardian gives verbal consent for treatment and assignment of benefits for services provided during this visit. Patient/Guardian expressed understanding and agreed to proceed.    Plan:  Continue Abilify  Maintenna 400 mg q28 days Continue Prozac  60 mg daily Continue  500 mg daily Continue Olanzapine  5 mg nightly Continue psycho-education and medication adherence support Self harm reduction planning Alternate ERP for OCD and CBT for psychosis  Marlo Masson, MD 05/28/2024

## 2024-06-10 NOTE — Progress Notes (Unsigned)
 [facility] [address] [place] [address] Dept: [phone] Dept Fax: [phone]  Psychotherapy Progress Note  Patient ID: Eileen Rivas, female  DOB: 2001/05/13, 23 y.o.  MRN: [id number]  06/11/24  Start time: 1:00 PM End time: 2:00 PM  Method of Visit: Virtual Patient location: Home Provider Location: Office  I connected with Eileen Rivas on 06/11/24 at  1:00 PM EDT by a video enabled telemedicine application and verified that I am speaking with the correct person using two identifiers.  I provided 60 minutes of non-face-to-face time during this encounter.  Current Concerns:  Medication adherence  Schizophrenia Related Concerns Cognitive distortions -- difficulty distinguishing obsessions from delusional content Reality testing of delusional thought content Psychoeducation on med compliance OCD related Concerns Contamination obsessions Religious/scrupulosity-themed obsessions (fear of mark of the beast, AI tracking, going to hell).   Current Symptoms: OCD and Other: psychosis related to schizophrenia diagnosis  The patient reports that things have been "a little better" recently. She shares that she had been off her medications but restarted them four days ago. Prior to this, she was taking her medications inconsistently, missing doses every other day. She identified that setting alarms has helped her remember to take her medications regularly.  She admits she has not been using her coping card as frequently, but she was able to access previously written affirmations and read them aloud. She finds these affirmations still helpful and does not wish to change any of the previous statements.  During the session, she reports that auditory hallucinations are not present, but notes that they tend to resurface when she is alone. She describes hearing either a voice or a thought suggesting she is giving something to  [facility] or "something bad about God."  She reports it is difficult to assess retrospectively whether or not she is experiencing these disturbances as hallucinations or obsessive thoughts.  The patient describes that when she hears them, she feels disgust and annoyance.  The patient describes increased anxiety related to these negative thoughts, expressing concern that they may be her own thoughts rather than true auditory hallucinations. Psychoeducation was provided regarding her OCD diagnosis, emphasizing that obsessions are often negative, ego-dystonic, and managed with medication. She was reassured that unpleasant, intrusive thoughts that feel unlike her are likely related to OCD rather than psychosis. She agreed that it would be helpful to have a set of statements to use when she hears these voices or experiences intrusive thoughts. Given the importance of her faith, she requested that some statements be related to her relationship with God, whom she identifies as a source of strength. Affirmations developed include: "God knows my heart, I have a good heart," "I love God," "The voices that I am hearing do not reflect reality," "God is more powerful," "If what I'm hearing are my thoughts, then these bad thoughts come from my OCD," "They're not my thoughts, they're part of my OCD," and "I am not my obsessive thoughts."  Overall, the session focused on reinforcing medication adherence, utilizing coping strategies, and distinguishing between OCD-related intrusive thoughts and auditory hallucinations, with an emphasis on faith-based and cognitive-behavioral techniques.  --The patient provided verbal consent for me to contact her mother at the end of the session.  Attempted to contact Eileen Rivas at [phone], no answer.  Left a HIPAA compliant voicemail informing her that the patient has an upcoming in person therapy appointment with me on September 4th at 1 PM, followed by administration of her  Abilify   Maintena injection at 2 PM.   Psychiatric Specialty Exam: General Appearance: Limited assessment of appearance  Eye Contact:  Fair  Speech:  Clear and Coherent  Volume:  Normal  Mood:  Better    Affect: Flat  Thought Process:  Linear and Descriptions of Associations: Intact  Orientation:  Other:  Grossly intact  Thought Content:  Ideas of Reference:   Delusions and Obsessions    Suicidal Thoughts:  No  Homicidal Thoughts:  No  Memory:  Grossly intact  Judgement:  Fair as evidenced by ongoing commitment to attending therapy and med management appointments; medication adherence continues to be a problem.   Insight: Fair  Psychomotor Activity: Grossly normal   Concentration:  Concentration: Fair and Attention Span: Fair  Recall: Recent memory grossly intact; long-term recall from previous sessions appears limited   Fund of Knowledge:Fair  Language: Good  Akathisia:  No  Handed: Not formally assessed  AIMS (if indicated):  not done  Assets:  Desire for Improvement Housing Physical Health Resilience  ADL's:  Intact  Cognition: Grossly impaired  Sleep:  Good   Diagnosis:  Obsessive-compulsive disorder and Schizophrenia vs Schizophrenia with OCD features  Anticipated Frequency of Visits: Every other week until November Anticipated Length of Treatment Episode: To be determined  Short Term Goals/Goals for Treatment Session:  Psychosis Treatment Plan  Take medications as prescribed and report any side effects to appropriate professionals Help the patient develop reality based, positive cognitive messages  OCD Treatment Plan  Taking medications as prescribed and report any side effects to appropriate professionals Utilize coping skills taught in therapy to reduce symptoms Educate ondividual about their illness and importance of medication adherence  Progress Towards Goals: Limited progress as medication adherence continues to be a challenge, and the patient requires ongoing  support and reinforcement of coping strategies.  Treatment Intervention:  Psycho education / Medication Adherence Support CBT for psychosis  ERT   Medical Necessity: Improved patient condition  Assessment Tools:    04/23/2024    9:16 AM 02/18/2024    9:32 AM 08/20/2023    1:52 PM  Depression screen PHQ 2/9  Decreased Interest 2 2 3   Down, Depressed, Hopeless 3 1 2   PHQ - 2 Score 5 3 5   Altered sleeping 3 1 3   Tired, decreased energy 1 2 2   Change in appetite 3 3 3   Feeling bad or failure about yourself  0 2 2  Trouble concentrating 2 1 2   Moving slowly or fidgety/restless 3 2 3   Suicidal thoughts 0 0 0  PHQ-9 Score 17 14 20   Difficult doing work/chores Somewhat difficult Somewhat difficult Very difficult   Failed to redirect to the Timeline version of the REVFS SmartLink. Flowsheet Row UC from 12/31/2023 in [facility]) ED from 11/08/2020 in [facility] Admission (Discharged) [facility]  C-SSRS RISK CATEGORY Moderate Risk High Risk No Risk    Collaboration of Care: none  Patient/Guardian was advised Release of Information must be obtained prior to any record release in order to collaborate their care with an outside provider. Patient/Guardian was advised if they have not already done so to contact the registration department to sign all necessary forms in order for us  to release information regarding their care.   Consent: Patient/Guardian gives verbal consent for treatment and assignment of benefits for services provided during this visit. Patient/Guardian expressed understanding and agreed to proceed.  Plan:  Continue Abilify  Maintenna 400 mg q28 days (next dose due 06/25/2024) Continue Prozac  60 mg daily Continue  500 mg daily Continue Olanzapine  5 mg nightly Continue metformin  500 mg daily Continue psycho-education and medication adherence  support Self harm reduction planning Alternate ERP for OCD and CBT for psychosis  Marlo Masson, MD 06/11/2024

## 2024-06-11 ENCOUNTER — Ambulatory Visit (INDEPENDENT_AMBULATORY_CARE_PROVIDER_SITE_OTHER): Payer: MEDICAID | Admitting: Student in an Organized Health Care Education/Training Program

## 2024-06-11 DIAGNOSIS — R635 Abnormal weight gain: Secondary | ICD-10-CM

## 2024-06-11 DIAGNOSIS — F203 Undifferentiated schizophrenia: Secondary | ICD-10-CM

## 2024-06-11 DIAGNOSIS — F422 Mixed obsessional thoughts and acts: Secondary | ICD-10-CM | POA: Diagnosis not present

## 2024-06-11 DIAGNOSIS — T50905A Adverse effect of unspecified drugs, medicaments and biological substances, initial encounter: Secondary | ICD-10-CM | POA: Insufficient documentation

## 2024-06-11 DIAGNOSIS — F429 Obsessive-compulsive disorder, unspecified: Secondary | ICD-10-CM | POA: Diagnosis not present

## 2024-06-11 MED ORDER — OLANZAPINE 5 MG PO TABS: 5.0000 mg | ORAL_TABLET | Freq: Every day | ORAL | 2 refills | Status: DC

## 2024-06-11 MED ORDER — FLUOXETINE HCL 40 MG PO CAPS
40.0000 mg | ORAL_CAPSULE | Freq: Every day | ORAL | 2 refills | Status: DC
Start: 2024-06-11 — End: 2024-06-25

## 2024-06-11 MED ORDER — METFORMIN HCL ER 500 MG PO TB24: 500.0000 mg | ORAL_TABLET | Freq: Every day | ORAL | 2 refills | Status: DC

## 2024-06-11 MED ORDER — FLUOXETINE HCL 20 MG PO CAPS
20.0000 mg | ORAL_CAPSULE | Freq: Every day | ORAL | 3 refills | Status: DC
Start: 2024-06-11 — End: 2024-06-25

## 2024-06-24 ENCOUNTER — Ambulatory Visit (HOSPITAL_COMMUNITY): Payer: MEDICAID

## 2024-06-24 NOTE — Progress Notes (Signed)
 [facility] [address] [place] [address] Dept: [phone] Dept Fax: [phone]  Psychotherapy Progress Note  Patient ID: Eileen Rivas, female  DOB: 2000-11-28, 23 y.o.  MRN: [id number]  06/25/24  Start time: 1:00 PM End time: 2:00 PM  Method of Visit: In-person  I provided 60 minutes of non-face-to-face time during this encounter.  Current Concerns:  Medication adherence  Schizophrenia Related Concerns Cognitive distortions -- difficulty distinguishing obsessions from delusional content Reality testing of delusional thought content Psychoeducation on med compliance OCD related Concerns Contamination obsessions Religious/scrupulosity-themed obsessions (fear of mark of the beast, AI tracking, going to hell).  Current Symptoms: OCD and Other: psychosis related to schizophrenia diagnosis Patient arrived to the session accompanied by her mother. She reports ongoing difficulty with medication compliance, stating she is only consistently taking her nightly olanzapine  and often forgets or avoids her other prescribed medications. Her mother confirms this pattern and expresses concern about the impact on her mental health.  The patient describes persistent avoidance of the kitchen, explaining that she feels intense anxiety and discomfort when near the trash can due to obsessions about contamination. She reports that these obsessions are constant throughout the day and lead to compulsive behaviors, such as avoiding certain areas of the house and excessive handwashing. She also admits to recurrent intrusive thoughts about bowel movements, which result in frequent bathroom visits to check and reassure herself. The patient acknowledges that these compulsions are time-consuming and interfere with her ability to complete daily tasks.  She expresses frustration and distress about her symptoms, stating that she feels "trapped" by her  routines and unable to control her thoughts. She notes that her sleep schedule is significantly disrupted, with difficulty falling asleep at night and often not sleeping until around 6 AM. She attributes some of her sleep difficulties to racing thoughts and anxiety in the evenings. The patient also shares that she feels isolated during the day, as her mother works long hours and her sisters are away at college, leaving her alone at home for extended periods. She reports that this isolation sometimes worsens her anxiety and compulsive behaviors.  Both patient and mother express concern about the ongoing impact of these symptoms on her quality of life, daily functioning, and ability to participate in normal activities. The patient expresses a desire to improve her medication adherence and reduce the severity of her obsessions and compulsions.  Psychiatric Specialty Exam: General Appearance: Limited assessment of appearance  Eye Contact:  Fair  Speech:  Clear and Coherent  Volume:  Normal  Mood:  Fine    Affect: Flat  Thought Process:  Linear and Descriptions of Associations: Intact  Orientation:  Other:  Grossly intact  Thought Content:  Ideas of Reference:   Delusions and Obsessions    Suicidal Thoughts:  No  Homicidal Thoughts:  No  Memory:  Grossly intact  Judgement:  Fair as evidenced by ongoing commitment to attending therapy and med management appointments; medication adherence continues to be a problem.   Insight: Fair  Psychomotor Activity: Grossly normal   Concentration:  Concentration: Fair and Attention Span: Fair  Recall: Recent memory grossly intact; long-term recall from previous sessions appears limited   Fund of Knowledge:Fair  Language: Good  Akathisia:  No  Handed: Not formally assessed  AIMS (if indicated):  not done  Assets:  Desire for Improvement Housing Physical Health Resilience  ADL's:  Intact  Cognition: Grossly impaired  Sleep:  Good   Diagnosis:  Obsessive-compulsive disorder and Schizophrenia vs Schizophrenia with OCD features  Anticipated Frequency of Visits: Every other week until November Anticipated Length of Treatment Episode: To be determined  Short Term Goals/Goals for Treatment Session:  Psychosis Treatment Plan  Take medications as prescribed and report any side effects to appropriate professionals Help the patient develop reality based, positive cognitive messages  OCD Treatment Plan  Taking medications as prescribed and report any side effects to appropriate professionals Utilize coping skills taught in therapy to reduce symptoms Educate ondividual about their illness and importance of medication adherence  Progress Towards Goals: Limited progress as medication adherence continues to be a challenge, and the patient requires ongoing support and reinforcement of coping strategies.  Treatment Intervention:  CBT and OCD-specific therapy have been paused because the patient's ongoing medication noncompliance is at the crux of the problem. Until adherence improves and symptoms are better controlled, the patient is unlikely to benefit from or fully participate in evidence-based behavioral interventions. The current clinical focus is on addressing barriers to medication adherence, as this is foundational for subsequent engagement in CBT and OCD-related therapy.   Medical Necessity: Improved patient condition  Assessment Tools:    04/23/2024    9:16 AM 02/18/2024    9:32 AM 08/20/2023    1:52 PM  Depression screen PHQ 2/9  Decreased Interest 2 2 3   Down, Depressed, Hopeless 3 1 2   PHQ - 2 Score 5 3 5   Altered sleeping 3 1 3   Tired, decreased energy 1 2 2   Change in appetite 3 3 3   Feeling bad or failure about yourself  0 2 2  Trouble concentrating 2 1 2   Moving slowly or fidgety/restless 3 2 3   Suicidal thoughts 0 0 0  PHQ-9 Score 17 14 20   Difficult doing work/chores Somewhat difficult Somewhat difficult Very  difficult   Failed to redirect to the Timeline version of the REVFS SmartLink. Flowsheet Row UC from 12/31/2023 in [facility]) ED from 11/08/2020 in [facility] Admission (Discharged) [facility]  C-SSRS RISK CATEGORY Moderate Risk High Risk No Risk    Collaboration of Care: none  Patient/Guardian was advised Release of Information must be obtained prior to any record release in order to collaborate their care with an outside provider. Patient/Guardian was advised if they have not already done so to contact the registration department to sign all necessary forms in order for us  to release information regarding their care.   Consent: Patient/Guardian gives verbal consent for treatment and assignment of benefits for services provided during this visit. Patient/Guardian expressed understanding and agreed to proceed.    Plan:  Continue Abilify  Maintenna 400 mg q28 days (next dose due 06/25/2024) Continue Prozac  60 mg daily Continue  500 mg daily Continue Olanzapine  5 mg nightly Continue metformin  500 mg daily Continue psycho-education and medication adherence support   Marlo Masson, MD 06/25/2024

## 2024-06-25 ENCOUNTER — Encounter (HOSPITAL_COMMUNITY): Payer: Self-pay

## 2024-06-25 ENCOUNTER — Encounter (HOSPITAL_COMMUNITY): Payer: Self-pay | Admitting: Student in an Organized Health Care Education/Training Program

## 2024-06-25 ENCOUNTER — Ambulatory Visit (INDEPENDENT_AMBULATORY_CARE_PROVIDER_SITE_OTHER): Payer: MEDICAID | Admitting: Student in an Organized Health Care Education/Training Program

## 2024-06-25 ENCOUNTER — Ambulatory Visit (HOSPITAL_COMMUNITY): Payer: MEDICAID

## 2024-06-25 VITALS — BP 101/67 | HR 86 | Ht 61.0 in | Wt 192.0 lb

## 2024-06-25 VITALS — BP 101/67 | HR 86 | Ht 61.0 in | Wt 192.5 lb

## 2024-06-25 DIAGNOSIS — F429 Obsessive-compulsive disorder, unspecified: Secondary | ICD-10-CM

## 2024-06-25 DIAGNOSIS — G4700 Insomnia, unspecified: Secondary | ICD-10-CM

## 2024-06-25 DIAGNOSIS — R635 Abnormal weight gain: Secondary | ICD-10-CM

## 2024-06-25 DIAGNOSIS — F203 Undifferentiated schizophrenia: Secondary | ICD-10-CM | POA: Diagnosis not present

## 2024-06-25 DIAGNOSIS — T50905A Adverse effect of unspecified drugs, medicaments and biological substances, initial encounter: Secondary | ICD-10-CM

## 2024-06-25 DIAGNOSIS — F422 Mixed obsessional thoughts and acts: Secondary | ICD-10-CM | POA: Diagnosis not present

## 2024-06-25 MED ORDER — ARIPIPRAZOLE ER 400 MG IM PRSY
400.0000 mg | PREFILLED_SYRINGE | Freq: Once | INTRAMUSCULAR | Status: AC
  Administered 2024-06-25: 400 mg via INTRAMUSCULAR

## 2024-06-25 MED ORDER — ABILIFY MAINTENA 400 MG IM PRSY: 400.0000 mg | PREFILLED_SYRINGE | INTRAMUSCULAR | 11 refills | Status: AC

## 2024-06-25 MED ORDER — METFORMIN HCL ER 500 MG PO TB24: 500.0000 mg | ORAL_TABLET | Freq: Every day | ORAL | 2 refills | Status: DC

## 2024-06-25 MED ORDER — ATORVASTATIN CALCIUM 80 MG PO TABS: 80.0000 mg | ORAL_TABLET | Freq: Every day | ORAL | 1 refills | Status: AC

## 2024-06-25 MED ORDER — TRAZODONE HCL 50 MG PO TABS: 50.0000 mg | ORAL_TABLET | Freq: Every day | ORAL | 1 refills | Status: AC

## 2024-06-25 MED ORDER — OLANZAPINE 5 MG PO TABS: 5.0000 mg | ORAL_TABLET | Freq: Every day | ORAL | 2 refills | Status: DC

## 2024-06-25 MED ORDER — FLUOXETINE HCL 40 MG PO CAPS: 40.0000 mg | ORAL_CAPSULE | Freq: Every day | ORAL | 2 refills | Status: DC

## 2024-06-25 MED ORDER — FLUOXETINE HCL 20 MG PO CAPS: 20.0000 mg | ORAL_CAPSULE | Freq: Every day | ORAL | 3 refills | Status: DC

## 2024-06-25 NOTE — Progress Notes (Cosign Needed)
 Patient presented to office for injection of ablilfy maintena 400 mg. Patient recived injection in right deltoid. Pt tolerated injection well. Pt will return in 28 days. Patient had appointment with provider before injection. No complaints noted.

## 2024-06-25 NOTE — Patient Instructions (Addendum)
 Daytime medications: Take at 10AM Take Prozac  for OCD Take Metformin  for management and weight and blood sugar Take Atorvastatin  for management of cholesterol   Nighttime Medications: Take at 8PM Take Trazodone   Take Zyprexa   Affirmations for Taking Sleep Medication Taking my sleep medication is an act of self-care and healing. By taking my medication, I am giving my body the rest it needs to recover and recharge. My sleep medication helps restore balance to my mind and body. I am committed to my health, and taking my medication is part of that commitment. Taking my medication each night sets me up for a healthier tomorrow.

## 2024-07-08 NOTE — Progress Notes (Signed)
 [facility] [address] [place] [address] Dept: [phone] Dept Fax: [phone]  Psychotherapy Progress Note  Patient ID: Eileen Rivas, female  DOB: [birth year], 23 y.o.  MRN: [id number]  07/09/2024 Start time: 1:00 PM End time: 2:00 PM  Method of Visit: In-person  I provided 60 minutes of non-face-to-face time during this encounter.  Current Concerns:  Medication adherence  Schizophrenia Related Concerns Cognitive distortions -- difficulty distinguishing obsessions from delusional content Reality testing of delusional thought content Psychoeducation on med compliance OCD related Concerns Contamination obsessions Religious/scrupulosity-themed obsessions (fear of mark of the beast, AI tracking, going to hell).  Current Symptoms: OCD and Other: psychosis related to schizophrenia diagnosis  Patient presents to session accompanied by mother. Reports no acute exacerbation of psychotic symptoms; denies hallucinations or delusions. Endorses ongoing difficulty with medication adherence.. Patient and mother both acknowledge that missed doses have contributed to continued symptom instability.  No new stressors reported. Homework not completed due to inconsistent medication use.  Session focused on psychoeducation regarding the importance of medication adherence for symptom stability and the limited efficacy of CBT at this time. Reviewed strategies for improving adherence, including environmental cues and family involvement. Collaboratively developed a plan to consolidate medication dosing to 8 PM, with mother assuming responsibility for daily administration. Discussed the need to prioritize medication management appointments at this time.  Patient was receptive to psychoeducation but demonstrated limited insight into the impact of nonadherence on therapeutic progress. Mother was engaged and agreed to take an active role in  medication administration. Patient expressed willingness to follow the new dosing schedule but remained ambivalent about ongoing therapy participation until adherence improves.  Psychiatric Specialty Exam: General Appearance: Limited assessment of appearance  Eye Contact:  Fair  Speech:  Clear and Coherent  Volume:  Normal  Mood:  Fine    Affect: Flat  Thought Process:  Linear and Descriptions of Associations: Intact  Orientation:  Other:  Grossly intact  Thought Content:  Ideas of Reference:   Delusions and Obsessions    Suicidal Thoughts:  No  Homicidal Thoughts:  No  Memory:  Grossly intact  Judgement:  Fair as evidenced by ongoing commitment to attending therapy and med management appointments; medication adherence continues to be a problem.   Insight: Fair  Psychomotor Activity: Grossly normal   Concentration:  Concentration: Fair and Attention Span: Fair  Recall: Recent memory grossly intact; long-term recall from previous sessions appears limited   Fund of Knowledge:Fair  Language: Good  Akathisia:  No  Handed: Not formally assessed  AIMS (if indicated):  not done  Assets:  Desire for Improvement Housing Physical Health Resilience  ADL's:  Intact  Cognition: Grossly impaired  Sleep:  Good   Diagnosis:  Obsessive-compulsive disorder and Schizophrenia vs Schizophrenia with OCD features  Anticipated Frequency of Visits: NA Anticipated Length of Treatment Episode:NA  Short Term Goals/Goals for Treatment Session:  Defer further CBT interventions until consistent medication adherence is established; focus on medication management and psychoeducation in interim.  Progress Towards Goals: Limited progress as medication adherence continues to be a challenge, and the patient requires ongoing support.  Treatment Intervention:  Return to regular medication management appointments and continue to reassess readiness to resume CBT interventions   Medical Necessity: Improved  patient condition  Assessment Tools:    04/23/2024    9:16 AM 02/18/2024    9:32 AM 08/20/2023    1:52 PM  Depression screen PHQ 2/9  Decreased Interest 2 2 3  Down, Depressed, Hopeless 3 1 2   PHQ - 2 Score 5 3 5   Altered sleeping 3 1 3   Tired, decreased energy 1 2 2   Change in appetite 3 3 3   Feeling bad or failure about yourself  0 2 2  Trouble concentrating 2 1 2   Moving slowly or fidgety/restless 3 2 3   Suicidal thoughts 0 0 0  PHQ-9 Score 17 14 20   Difficult doing work/chores Somewhat difficult Somewhat difficult Very difficult   Failed to redirect to the Timeline version of the REVFS SmartLink. Flowsheet Row UC from 12/31/2023 in [facility] ED from 11/08/2020 in [facility]Dis[facility]) [facility]  C-SSRS RISK CATEGORY Moderate Risk High Risk No Risk    Collaboration of Care: none  Patient/Guardian was advised Release of Information must be obtained prior to any record release in order to collaborate their care with an outside provider. Patient/Guardian was advised if they have not already done so to contact the registration department to sign all necessary forms in order for us  to release information regarding their care.   Consent: Patient/Guardian gives verbal consent for treatment and assignment of benefits for services provided during this visit. Patient/Guardian expressed understanding and agreed to proceed.    Plan:  Continue Abilify  Maintenna 400 mg q28 days (next dose due 06/25/2024) Continue Prozac  60 mg daily Continue  500 mg daily Continue Olanzapine  5 mg nightly Continue metformin  500 mg daily Continue psycho-education and medication adherence support  Defer further CBT interventions until consistent medication adherence is established; focus on medication management and psychoeducation in interim.  Marlo Masson,  MD 07/08/2024

## 2024-07-09 ENCOUNTER — Ambulatory Visit (INDEPENDENT_AMBULATORY_CARE_PROVIDER_SITE_OTHER): Payer: MEDICAID | Admitting: Student in an Organized Health Care Education/Training Program

## 2024-07-09 VITALS — BP 105/71 | HR 77 | Ht 60.5 in | Wt 194.0 lb

## 2024-07-09 DIAGNOSIS — F429 Obsessive-compulsive disorder, unspecified: Secondary | ICD-10-CM | POA: Diagnosis not present

## 2024-07-09 DIAGNOSIS — F200 Paranoid schizophrenia: Secondary | ICD-10-CM

## 2024-07-10 ENCOUNTER — Encounter (HOSPITAL_COMMUNITY): Payer: Self-pay | Admitting: Student in an Organized Health Care Education/Training Program

## 2024-07-23 ENCOUNTER — Encounter (HOSPITAL_COMMUNITY): Payer: Self-pay | Admitting: Student in an Organized Health Care Education/Training Program

## 2024-07-23 ENCOUNTER — Encounter (HOSPITAL_COMMUNITY): Payer: Self-pay

## 2024-07-23 ENCOUNTER — Ambulatory Visit (INDEPENDENT_AMBULATORY_CARE_PROVIDER_SITE_OTHER): Payer: MEDICAID | Admitting: Student in an Organized Health Care Education/Training Program

## 2024-07-23 ENCOUNTER — Ambulatory Visit (HOSPITAL_COMMUNITY): Payer: MEDICAID | Admitting: Student in an Organized Health Care Education/Training Program

## 2024-07-23 ENCOUNTER — Ambulatory Visit (INDEPENDENT_AMBULATORY_CARE_PROVIDER_SITE_OTHER): Payer: MEDICAID

## 2024-07-23 VITALS — BP 111/67 | HR 84 | Ht 61.5 in | Wt 192.0 lb

## 2024-07-23 VITALS — BP 87/65 | HR 82 | Wt 191.8 lb

## 2024-07-23 DIAGNOSIS — F251 Schizoaffective disorder, depressive type: Secondary | ICD-10-CM

## 2024-07-23 DIAGNOSIS — F200 Paranoid schizophrenia: Secondary | ICD-10-CM

## 2024-07-23 DIAGNOSIS — F429 Obsessive-compulsive disorder, unspecified: Secondary | ICD-10-CM | POA: Diagnosis not present

## 2024-07-23 MED ORDER — ARIPIPRAZOLE ER 400 MG IM PRSY
400.0000 mg | PREFILLED_SYRINGE | INTRAMUSCULAR | Status: AC
  Administered 2024-07-23 – 2024-11-11 (×4): 400 mg via INTRAMUSCULAR

## 2024-07-23 NOTE — Progress Notes (Cosign Needed)
 Patient presented to office for injection of Abilify  Maintena 400 mg. Patient received injection in  left deltoid . Patient tolerated injection well. Patient saw provider prior to injection. Patient denies any concerns. Patient will return in 28 days.

## 2024-07-23 NOTE — Progress Notes (Cosign Needed)
 [facility] MD Outpatient Progress Note  07/23/2024 5:43 PM Eileen Rivas  MRN:  [id number]  Assessment:  Eileen Rivas presents for follow-up evaluation.    Since the last visit, the patient's medication adherence has improved, with the mother now overseeing administration. Subjectively, the patient reports some improvement in mood symptoms; however, obsessive symptoms remain significant and unchanged. What is also promising is the patient's reports of improvement in frequency of auditory hallucinations. Of note, improved adherence has only been established over the past two weeks. Sleep disturbance persists, but the patient has declined pharmacologic intervention for insomnia. At this visit we emphasized behavioral modifications, specifically limiting evening phone use and reducing intake of sugary foods and beverages.  At this time, the current medication regimen is appropriate to continue at the present dose, given the recent improvement in adherence and partial symptomatic response. We will proceed with titration of Prozac  if obsessions have not improved at next appointment.   Psychotherapy will be reinitiated once medication adherence is sustained and symptoms of psychosis are better controlled.  Identifying information: Eileen Rivas is a 23 y.o. y.o. female with a history of schizophrenia and OCD who is an established patient with [facility] for management of schizophrenia and obsessive thoughts and compulsive behaviors.  The patient's main social support is her mother, Eileen Rivas.  The patient was hospitalized in 2021 at the behavioral hospital for obsessive religious thoughts and was given the diagnosis of OCD.  She is a former patient of Dr. Marry, who had initiated a combination of Abilify  Maintena, resulting in significant weight gain and with patient still having refractory difficulties with reality testing.  Per Dr. Marry, Zyprexa  was later added on as it was  thought that the efficacy advantage was worth the increased risk of metabolic side effects, of note the patient has not responded well to Risperdal in the past (hyperprolactinemia) and SGA's are being avoided due to history of prolactin elevation.  Medication adherence has continued to be challenging, with patient continuing to experience significant obsessions and lingering symptoms of psychosis, psychotherapy sessions have been paused at this time to focus on stabilization of symptoms.  We have been working closely with the patient's mother, who has agreed to administer the patient's medications.  Plan:  # Obsessive-compulsive disorder Interventions:unchanged -- Continue Prozac  at 60 mg daily  --will consider titration at next visit  - Continue psychotherapy with this provider  # Schizophrenia #Medication non-adherence (improving) Interventions: improving -- Continue Zyprexa  5 mg qHS - Abilify  maintena 400 mg q28d (last given 07/23/2024) -- Prev hyperprolactinemia on risperidone LAI  # Antipsychotic induced weight gain -- Quite clear based on timeline: consistently 120 lbs before 04/2020 (her first psychiatric hospitalization). -- Patient has complained about psychological impact of weight gain -- Continue metformin  XR 500 daily -- Discussed common side effects  #Long term use of antipsychotic medication -- Non fasting lipid panel on 03/02/2024 obtained at 1:04 PM; Chol 211, Tgs 220, LDL 134, elevated Chol/HDL 5.7 - A1c from 05/28/2023, normal - EKG unremarkable April 2025 (appreciate PCP assistance) -- No TD evident on previous encounters  #Elevated TSH, Hypertriglyceridemia - Needs to be addressed by PCP   Patient was given contact information for behavioral health clinic and was instructed to call 911 for emergencies.   Subjective:  Chief Complaint:  Chief Complaint  Patient presents with   Follow-up    Interval History:  Patient reports feeling less depressed compared  to the previous visit and describes having become more accustomed  to her symptoms. She reports that obsessive thoughts remain intense, though she feels more hopeful overall. She states that obsessions are unchanged, with continued handwashing rituals and frequent attempts to reassure herself that she has not touched anything contaminated. Patient reports ongoing auditory hallucinations, which she describes as improving. She denies suicidal ideation.  Patient reports increased appetite and ongoing weight gain as adverse effects of her medications. She describes medication compliance as variable, with her mother confirming assistance with medication administration. She reports sleeping around 11 pm and waking at 2 am, with additional sleep occurring during the day. She also reports consuming juice in the evenings.  Patient reports that she is not currently in school and is unable to work at her current level of functioning.   Visit Diagnosis:    ICD-10-CM   1. Schizophrenia, paranoid (HCC)  F20.0     2. Obsessive-compulsive disorder, unspecified type  F42.9      Past Psychiatric History: As above  Past Medical History:  Past Medical History:  Diagnosis Date   Anxiety    Depression    Obsessive-compulsive disorder    No past surgical history on file.  Family Psychiatric History: None pertinent  Family History: No family history on file.  Social History:  Social History   Socioeconomic History   Marital status: Single    Spouse name: Not on file   Number of children: 0   Years of education: Not on file   Highest education level: Not on file  Occupational History   Occupation: student    Comment: [employer]  Tobacco Use   Smoking status: Never    Passive exposure: Never   Smokeless tobacco: Never  Vaping Use   Vaping status: Never Used  Substance and Sexual Activity   Alcohol use: Yes    Comment: occassional   Drug use: No   Sexual activity: Never  Other Topics  Concern   Not on file  Social History Narrative   Not on file   Social Drivers of Health   Financial Resource Strain: Low Risk  (07/24/2022)   Overall Financial Resource Strain (CARDIA)    Difficulty of Paying Living Expenses: Not hard at all  Food Insecurity: Not on File (07/18/2023)   Received from [facility] Insecurity    Food: 0  Transportation Needs: No Transportation Needs (07/24/2022)   PRAPARE - Administrator, Civil Service (Medical): No    Lack of Transportation (Non-Medical): No  Physical Activity: Inactive (07/24/2022)   Exercise Vital Sign    Days of Exercise per Week: 0 days    Minutes of Exercise per Session: 0 min  Stress: Stress Concern Present (07/24/2022)   [employer] of Occupational Health - Occupational Stress Questionnaire    Feeling of Stress : To some extent  Social Connections: Not on File (07/16/2023)   Received from [facility]   Social Connections    Connectedness: 0    Allergies: No Known Allergies  Current Medications: Current Outpatient Medications  Medication Sig Dispense Refill   ARIPiprazole  ER (ABILIFY  MAINTENA) 400 MG PRSY prefilled syringe Inject 400 mg into the muscle every 28 (twenty-eight) days. 1 each 11   atorvastatin  (LIPITOR) 80 MG tablet Take 1 tablet (80 mg total) by mouth daily. 90 tablet 1   FLUoxetine  (PROZAC ) 20 MG capsule Take 1 capsule (20 mg total) by mouth daily. 30 capsule 3   FLUoxetine  (PROZAC ) 40 MG capsule Take 1 capsule (40 mg total) by mouth daily. Take  with 20mg . 30 capsule 2   metFORMIN  (GLUCOPHAGE -XR) 500 MG 24 hr tablet Take 1 tablet (500 mg total) by mouth daily with breakfast. 30 tablet 2   OLANZapine  (ZYPREXA ) 5 MG tablet Take 1 tablet (5 mg total) by mouth at bedtime. 30 tablet 2   traZODone  (DESYREL ) 50 MG tablet Take 1 tablet (50 mg total) by mouth at bedtime. 30 tablet 1   Current Facility-Administered Medications  Medication Dose Route Frequency Provider Last Rate Last Admin   ARIPiprazole   ER (ABILIFY  MAINTENA) 400 MG prefilled syringe 400 mg  400 mg Intramuscular Q28 days Parsons, Brittney E, NP   400 mg at 07/23/24 1429     Objective:  Psychiatric Specialty Exam: Physical Exam Constitutional:      Appearance: the patient is not toxic-appearing.  Pulmonary:     Effort: Pulmonary effort is normal.  Neurological:     General: No focal deficit present.     Mental Status: the patient is alert and oriented to person, place, and time.   Review of Systems  Respiratory:  Negative for shortness of breath.   Cardiovascular:  Negative for chest pain.  Gastrointestinal:  Negative for abdominal pain, constipation, diarrhea, nausea and vomiting.  Neurological:  Negative for headaches.      BP 111/67   Pulse 84   Ht 5' 1.5 (1.562 m)   Wt 192 lb (87.1 kg)   BMI 35.69 kg/m   General Appearance: Fairly Groomed   Eye Contact:  Good  Speech:  Clear and Coherent  Volume:  Normal  Mood:  less depressed, more hopeful  Affect:  Flat  Thought Process:  Coherent  Orientation:  Full (Time, Place, and Person)  Thought Content: delusional thought content noted  Suicidal Thoughts:  No  Homicidal Thoughts:  No  Memory:  Immediate;   Good  Judgement:  Fair  Insight:  Limited  Psychomotor Activity:  Normal  Concentration:  Concentration: Good  Recall:  Good  Fund of Knowledge: Good  Language: Good  Akathisia:  No  Handed:    AIMS (if indicated): not done  Assets:  Communication Skills Desire for Improvement Financial Resources/Insurance Housing Leisure Time Physical Health  ADL's:  Intact  Cognition: WNL       Metabolic Disorder Labs: Lab Results  Component Value Date   HGBA1C 5.5 05/28/2023   Lab Results  Component Value Date   PROLACTIN 16.5 05/28/2023   Lab Results  Component Value Date   CHOL 211 (H) 03/02/2024   TRIG 220 (H) 03/02/2024   HDL 37 (L) 03/02/2024   CHOLHDL 5.7 (H) 03/02/2024   LDLCALC 134 (H) 03/02/2024   LDLCALC 82 05/28/2023   Lab  Results  Component Value Date   TSH 4.910 (H) 03/02/2024   TSH 5.190 (H) 05/28/2023    Therapeutic Level Labs: No results found for: LITHIUM No results found for: VALPROATE No results found for: CBMZ  Screenings: AIMS    Flowsheet Row Clinical Support from 09/25/2022 in [facility] Clinical Support from 08/08/2022 in [facility] Office Visit from 07/24/2022 in [facility] Admission (Discharged) [facility]  AIMS Total Score 0 1 2 0   AUDIT    Flowsheet Row Admission (Discharged) [facility]  Alcohol Use Disorder Identification Test Final Score (AUDIT) 0   GAD-7    Flowsheet Row Office Visit from 02/18/2024 in [facility]  Family Medicine Office Visit from 08/20/2023 in [facility]  Total GAD-7 Score 7 17   PHQ2-9    Flowsheet Row Counselor from 04/23/2024 in [facility] Office Visit from 02/18/2024 in [facility] Family Medicine Office Visit from 08/20/2023 in [facility] Counselor from 07/25/2023 in [facility] Office Visit from 07/24/2022 in [facility]  PHQ-2 Total Score 5 3 5 6 4   PHQ-9 Total Score 17 14 20 19 13    Flowsheet Row UC from 12/31/2023 in [facility] at [facility] [address] [facility]) ED from 11/08/2020 in [facility] Admission (Discharged) from [facility]  C-SSRS RISK CATEGORY Moderate Risk High Risk No Risk    Collaboration of Care: none  A total of 30 minutes was spent involved in face to face clinical care, chart review, documentation.   Marlo Masson, MD 07/23/2024, 5:43 PM

## 2024-08-06 ENCOUNTER — Ambulatory Visit (HOSPITAL_COMMUNITY): Payer: MEDICAID | Admitting: Student in an Organized Health Care Education/Training Program

## 2024-08-20 ENCOUNTER — Ambulatory Visit (HOSPITAL_COMMUNITY): Payer: MEDICAID

## 2024-08-20 ENCOUNTER — Ambulatory Visit (HOSPITAL_COMMUNITY): Payer: MEDICAID | Admitting: Student in an Organized Health Care Education/Training Program

## 2024-08-20 VITALS — BP 105/68 | HR 72 | Ht 61.5 in | Wt 189.0 lb

## 2024-08-20 DIAGNOSIS — F200 Paranoid schizophrenia: Secondary | ICD-10-CM

## 2024-08-20 NOTE — Progress Notes (Signed)
 Patient presented to the office for injection of Abilify  Maintena 400 mg. Patient received injection in  Right  deltoid . Patient tolerated injection well. Patient saw provider prior to injection. Patient stated that she is having AH. Patient will return in 28 days.

## 2024-09-03 ENCOUNTER — Ambulatory Visit (HOSPITAL_COMMUNITY): Payer: MEDICAID | Admitting: Student in an Organized Health Care Education/Training Program

## 2024-09-03 ENCOUNTER — Encounter (HOSPITAL_COMMUNITY): Payer: Self-pay | Admitting: Student in an Organized Health Care Education/Training Program

## 2024-09-03 DIAGNOSIS — R635 Abnormal weight gain: Secondary | ICD-10-CM

## 2024-09-03 DIAGNOSIS — F203 Undifferentiated schizophrenia: Secondary | ICD-10-CM | POA: Diagnosis not present

## 2024-09-03 DIAGNOSIS — F429 Obsessive-compulsive disorder, unspecified: Secondary | ICD-10-CM

## 2024-09-03 DIAGNOSIS — G4700 Insomnia, unspecified: Secondary | ICD-10-CM

## 2024-09-03 DIAGNOSIS — T50905A Adverse effect of unspecified drugs, medicaments and biological substances, initial encounter: Secondary | ICD-10-CM

## 2024-09-03 MED ORDER — FLUOXETINE HCL 40 MG PO CAPS: 80.0000 mg | ORAL_CAPSULE | Freq: Every day | ORAL | 0 refills | Status: AC

## 2024-09-03 MED ORDER — OLANZAPINE 5 MG PO TABS: 5.0000 mg | ORAL_TABLET | Freq: Every day | ORAL | 2 refills | Status: DC

## 2024-09-03 MED ORDER — METFORMIN HCL ER 500 MG PO TB24: 500.0000 mg | ORAL_TABLET | Freq: Every day | ORAL | 0 refills | Status: DC

## 2024-09-03 NOTE — Patient Instructions (Addendum)
 [employer] in [place] offers a [employer] based on the [employer] model, designed to support adults living with major mental illness (such as schizophrenia, bipolar disorder, schizoaffective disorder, psychotic disorder, or major depression). The program provides a supportive community and opportunities for skill-building, socialization, and employment support.  Key Features: Skill-building activities: Cooking, meal planning, money management, office administration, computer skills, and more. Employment support: Assistance with finding and maintaining employment, including supported and independent job opportunities. Social and wellness activities: Group exercise, yoga, and social events to promote wellness and community engagement. Eligibility: Adults (21+) with a major mental illness, residents of [facility], not currently receiving Physiological Scientist or [employer] services.  How to Apply: Referrals can be made by providers, family members, or self-referral.  To start the intake process, call [phone] Ext [phone], Monday-Friday, 8:00 am-4:00 pm.   For more information, visit: [email or web address]   [address] [place], [place] [address] [phone]; Intake: [phone]

## 2024-09-03 NOTE — Progress Notes (Signed)
 [facility] MD Outpatient Progress Note  09/03/2024 6:06 PM Eileen Rivas  MRN:  [id number]  Assessment:  Eileen Rivas presents for follow-up evaluation on 09/03/24   Since the last visit, there has been no significant improvement in OCD symptoms. Weight gain continues to be a concern, and the patient expresses understanding of the metabolic risks associated with Zyprexa . At today's appointment, we discussed a longitudinal plan to prioritize stabilization of OCD symptoms before initiating a transition off Zyprexa . Once OCD is better controlled, the plan will be to taper Zyprexa  and consider Haldol, given its lower risk for metabolic side effects.  To address ongoing social isolation reported by pt, resources and programming have been provided, including information about [employer], which offers structured social and vocational opportunities.  Adjustment to medication regimen detailed below.  Identifying information: Eileen Rivas is a 23 y.o. y.o. female with a history of schizophrenia and OCD who is an established patient with [facility] for management of schizophrenia and obsessive thoughts and compulsive behaviors.  The patient's main social support is her mother, [name].  The patient was hospitalized in 2021 at the behavioral hospital for obsessive religious thoughts and was given the diagnosis of OCD.  She is a former patient of Dr. Marry, who had initiated a combination of Abilify  Maintena, resulting in significant weight gain and with patient still having refractory difficulties with reality testing.  Per Dr. Marry, Zyprexa  was later added on as it was thought that the efficacy advantage was worth the increased risk of metabolic side effects, of note the patient has not responded well to Risperdal in the past (hyperprolactinemia) and SGA's are being avoided due to history of prolactin elevation.  Medication adherence has been  improving.  The patient's mother has been administering the patient's medications.   Plan:  # Obsessive-compulsive disorder Interventions: -- Increase Prozac  at 60 mg to 80 mg daily for better control of OCD symptoms  # Schizophrenia Previous medication trials: Prev hyperprolactinemia on risperidone LAI Interventions:  -- Continue Zyprexa  5 mg at bedtime  --consider changing to haldol after OCD is stabilized due to ongoing weight gain - Continue Abilify  maintena 400 mg q28d (last given 08/20/2024)  #Insomnia Interventions: --STOP trazodone  50 mg nightly at patient's request, ineffective, declines titration   # Antipsychotic induced weight gain -- Quite clear based on timeline: consistently 120 lbs before 04/2020 (her first psychiatric hospitalization). -- Patient continues to express concern about psychological impact of weight gain -- Continue metformin  XR 500 daily   #Long term use of antipsychotic medication -- Non fasting lipid panel on 03/02/2024 obtained at 1:04 PM; Chol 211, Tgs 220, LDL 134, elevated Chol/HDL 5.7 - A1c from 05/28/2023, normal - EKG unremarkable April 2025 (appreciate PCP assistance)   #Elevated TSH, Hypertriglyceridemia - Needs to be addressed by PCP   Patient was given contact information for behavioral health clinic and was instructed to call 911 for emergencies.   Subjective:  Chief Complaint:  Chief Complaint  Patient presents with   Follow-up    Interval History:  Patient agrees to have her mother present for the psychiatric assessment. She reports that her symptoms of obsessive-compulsive disorder remain largely unchanged, with persistent contamination obsessions leading her to avoid the bathroom and kitchen. She describes ongoing concerns about her eating habits and continued weight gain. She states that her depressive symptoms are unchanged. Anxiety remains significant, primarily related to her OCD symptoms. She reports continued adherence  to metformin  and has some concerns  about pain. She describes taking her antipsychotic medication as directed. She denies any adverse effects from her current medications.  She reports that her sleep and appetite remain unchanged. She does not mention any recent changes in caffeine intake or recent substance use. She denies suicidal ideation, including passive thoughts, and identifies protective factors such as her religious beliefs and not wanting to make her mother suffer. She denies homicidal ideation. She describes auditory hallucinations occurring every two days, but believes the voice she hears is her own.    Visit Diagnosis:    ICD-10-CM   1. Obsessive-compulsive disorder, unspecified type  F42.9 FLUoxetine  (PROZAC ) 40 MG capsule    2. Undifferentiated schizophrenia (HCC) [F20.3]  F20.3 OLANZapine  (ZYPREXA ) 5 MG tablet    3. Weight gain due to medication  R63.5 metFORMIN  (GLUCOPHAGE -XR) 500 MG 24 hr tablet   T50.905A     4. Insomnia, unspecified type  G47.00       Past Psychiatric History: As above  Past Medical History:  Past Medical History:  Diagnosis Date   Anxiety    Depression    Obsessive-compulsive disorder    No past surgical history on file.  Family Psychiatric History: None pertinent  Family History: No family history on file.  Social History:  Social History   Socioeconomic History   Marital status: Single    Spouse name: Not on file   Number of children: 0   Years of education: Not on file   Highest education level: Not on file  Occupational History   Occupation: student    Comment: [employer]  Tobacco Use   Smoking status: Never    Passive exposure: Never   Smokeless tobacco: Never  Vaping Use   Vaping status: Never Used  Substance and Sexual Activity   Alcohol use: Yes    Comment: occassional   Drug use: No   Sexual activity: Never  Other Topics Concern   Not on file  Social History Narrative   Not on file   Social Drivers of  Health   Financial Resource Strain: Low Risk  (07/24/2022)   Overall Financial Resource Strain (CARDIA)    Difficulty of Paying Living Expenses: Not hard at all  Food Insecurity: Not on File (07/18/2023)   Received from [facility] In[facility]    Food: 0  Transportation Needs: No Transportation Needs (07/24/2022)   PRAPARE - Administrator, Civil Service (Medical): No    Lack of Transportation (Non-Medical): No  Physical Activity: Inactive (07/24/2022)   Exercise Vital Sign    Days of Exercise per Week: 0 days    Minutes of Exercise per Session: 0 min  Stress: Stress Concern Present (07/24/2022)   [employer] of Occupational Health - Occupational Stress Questionnaire    Feeling of Stress : To some extent  Social Connections: Not on File (07/16/2023)   Received from [facility]   Social Connections    Connectedness: 0    Allergies: No Known Allergies  Current Medications: Current Outpatient Medications  Medication Sig Dispense Refill   FLUoxetine  (PROZAC ) 40 MG capsule Take 2 capsules (80 mg total) by mouth daily. 60 capsule 0   ARIPiprazole  ER (ABILIFY  MAINTENA) 400 MG PRSY prefilled syringe Inject 400 mg into the muscle every 28 (twenty-eight) days. 1 each 11   atorvastatin  (LIPITOR) 80 MG tablet Take 1 tablet (80 mg total) by mouth daily. 90 tablet 1   FLUoxetine  (PROZAC ) 20 MG capsule Take 1 capsule (20 mg total) by mouth  daily. 30 capsule 3   FLUoxetine  (PROZAC ) 40 MG capsule Take 1 capsule (40 mg total) by mouth daily. Take with 20mg . 30 capsule 2   metFORMIN  (GLUCOPHAGE -XR) 500 MG 24 hr tablet Take 1 tablet (500 mg total) by mouth daily with breakfast. 90 tablet 0   OLANZapine  (ZYPREXA ) 5 MG tablet Take 1 tablet (5 mg total) by mouth at bedtime. 30 tablet 2   traZODone  (DESYREL ) 50 MG tablet Take 1 tablet (50 mg total) by mouth at bedtime. 30 tablet 1   Current Facility-Administered Medications  Medication Dose Route Frequency Provider Last Rate Last Admin    ARIPiprazole  ER (ABILIFY  MAINTENA) 400 MG prefilled syringe 400 mg  400 mg Intramuscular Q28 days Parsons, Brittney E, NP   400 mg at 08/20/24 1558     Objective:  Psychiatric Specialty Exam: Physical Exam Constitutional:      Appearance: the patient is not toxic-appearing.  Pulmonary:     Effort: Pulmonary effort is normal.  Neurological:     General: No focal deficit present.     Mental Status: the patient is alert and oriented to person, place, and time.   Review of Systems  Respiratory:  Negative for shortness of breath.   Cardiovascular:  Negative for chest pain.  Gastrointestinal:  Negative for abdominal pain, constipation, diarrhea, nausea and vomiting.  Neurological:  Negative for headaches.      BP 104/65   Pulse 78   Ht 5' 0.5 (1.537 m)   Wt 192 lb (87.1 kg)   BMI 36.88 kg/m   General Appearance: Fairly Groomed   Eye Contact:  Good  Speech:  Clear and Coherent  Volume:  Normal  Mood:  ok  Affect:  Flat  Thought Process:  Coherent  Orientation:  Full (Time, Place, and Person)  Thought Content: delusional thought content noted  Suicidal Thoughts:  No  Homicidal Thoughts:  No  Memory:  Immediate;   Good  Judgement:  Fair  Insight:  Limited  Psychomotor Activity:  Normal  Concentration:  Concentration: Good  Recall:  Good  Fund of Knowledge: Good  Language: Good  Akathisia:  No  Handed:    AIMS (if indicated): not done  Assets:  Communication Skills Desire for Improvement Financial Resources/Insurance Housing Leisure Time Physical Health  ADL's:  Intact  Cognition: WNL       Metabolic Disorder Labs: Lab Results  Component Value Date   HGBA1C 5.5 05/28/2023   Lab Results  Component Value Date   PROLACTIN 16.5 05/28/2023   Lab Results  Component Value Date   CHOL 211 (H) 03/02/2024   TRIG 220 (H) 03/02/2024   HDL 37 (L) 03/02/2024   CHOLHDL 5.7 (H) 03/02/2024   LDLCALC 134 (H) 03/02/2024   LDLCALC 82 05/28/2023   Lab Results   Component Value Date   TSH 4.910 (H) 03/02/2024   TSH 5.190 (H) 05/28/2023    Therapeutic Level Labs: No results found for: LITHIUM No results found for: VALPROATE No results found for: CBMZ  Screenings: AIMS    Flowsheet Row Clinical Support from 09/25/2022 in [facility] Clinical Support from 08/08/2022 in [facility] Office Visit from 07/24/2022 in [facility]  AIMS Total Score 0 1 2 0   AUDIT    Flowsheet Row Ad[facility]Discharged[facility]  Alcohol Use Disorder Identification Test Final Score (  AUDIT) 0   GAD-7    Flowsheet Row Office Visit from 02/18/2024 in [facility] Office Visit from 08/20/2023 in [facility]  Total GAD-7 Score 7 17   PHQ2-9    Flowsheet Row Counselor from 04/23/2024 in [facility] Office Visit from 02/18/2024 in [facility] Office Visit from 08/20/2023 in [facility] Counselor from 07/25/2023 in [facility] Office Visit from 07/24/2022 in [facility]  PHQ-2 Total Score 5 3 5 6 4   PHQ-9 Total Score 17 14 20 19 13    Flowsheet Row UC from 12/31/2023 in [facility] ED from 11/08/2020 in [facility] Admission (Discharged) [facility]  C-SSRS RISK CATEGORY Moderate Risk High Risk No Risk    Collaboration of Care: none  A total of 30 minutes was spent involved in face to face clinical care, chart review, documentation.   Marlo Masson, MD 09/03/2024, 6:06 PM

## 2024-09-22 ENCOUNTER — Ambulatory Visit (HOSPITAL_COMMUNITY): Payer: MEDICAID

## 2024-09-24 ENCOUNTER — Ambulatory Visit (INDEPENDENT_AMBULATORY_CARE_PROVIDER_SITE_OTHER): Payer: MEDICAID

## 2024-09-24 ENCOUNTER — Encounter (HOSPITAL_COMMUNITY): Payer: Self-pay

## 2024-09-24 VITALS — BP 106/69 | HR 74 | Resp 16 | Ht 61.5 in | Wt 190.8 lb

## 2024-09-24 DIAGNOSIS — F200 Paranoid schizophrenia: Secondary | ICD-10-CM

## 2024-09-24 NOTE — Progress Notes (Cosign Needed)
 Patient presented to office for injection of Abilify  Maintena 400 mg. Patient received injection in left deltoid. Patient tolerated injection well. Patient denies SI/HI/AVH. Patient reported a few days ago she heard a female voice in her sleep and immediately woke up. Per patient she did not make out what the voice said. Patient is not currently experiencing AH.Patient denies any concerns. Patient will return in 28 days. Patient left alert and ambulatory.

## 2024-10-09 ENCOUNTER — Telehealth (HOSPITAL_COMMUNITY): Payer: MEDICAID | Admitting: Student in an Organized Health Care Education/Training Program

## 2024-10-09 DIAGNOSIS — F209 Schizophrenia, unspecified: Secondary | ICD-10-CM | POA: Diagnosis not present

## 2024-10-09 DIAGNOSIS — G4700 Insomnia, unspecified: Secondary | ICD-10-CM

## 2024-10-09 DIAGNOSIS — F429 Obsessive-compulsive disorder, unspecified: Secondary | ICD-10-CM | POA: Diagnosis not present

## 2024-10-09 MED ORDER — TRAZODONE HCL 50 MG PO TABS: 50.0000 mg | ORAL_TABLET | Freq: Every day | ORAL | 1 refills | Status: DC

## 2024-10-09 MED ORDER — FLUOXETINE HCL 40 MG PO CAPS: 80.0000 mg | ORAL_CAPSULE | Freq: Every day | ORAL | 0 refills | Status: DC

## 2024-10-09 MED ORDER — FLUOXETINE HCL 20 MG PO CAPS: 20.0000 mg | ORAL_CAPSULE | Freq: Every day | ORAL | 0 refills | Status: DC

## 2024-10-09 NOTE — Progress Notes (Signed)
 [facility] MD Outpatient Progress Note  10/09/2024 2:00 PM Eileen Rivas  MRN:  [id number]  Virtual Visit via Telephone Note I connected with Eileen Rivas on 10/09/2024 at 10:00 AM EST by a video enabled telemedicine application and verified that I am speaking with the correct person using two identifiers.  Location: Patient: Home Provider: Office   I discussed the limitations, risks, security and privacy concerns of performing an evaluation and management service by telephone and the availability of in person appointments. I also discussed with the patient that there may be a patient responsible charge related to this service. The patient expressed understanding and agreed to proceed.   I discussed the assessment and treatment plan with the patient. The patient was provided an opportunity to ask questions and all were answered. The patient agreed with the plan and demonstrated an understanding of the instructions.   The patient was advised to call back or seek an in-person evaluation if the symptoms worsen or if the condition fails to improve as anticipated.   Eileen Masson, MD Psych Resident, PGY-3    Assessment:  Eileen Rivas presents for follow-up evaluation on 10/09/2024   At todays follow-up, the patient continues to present with persistent psychotic illness and severe obsessive-compulsive symptoms, overall unchanged since the last visit despite confirmed medication adherence with maternal supervision. The diagnostic picture remains complex, with ongoing uncertainty regarding whether her symptoms are best conceptualized as schizophrenia with prominent OCD features versus OCD with psychotic features; clinically, she continues to demonstrate both domains without clear dominance of one over the other.  She has been maintained on Abilify  Maintena monthly injections, which remains appropriate to continue at the current dose given its role in relapse prevention and prior stabilization  of psychotic symptoms. Zyprexa  was added previously for residual psychosis but has been associated with continued weight gain and emerging metabolic concerns. At todays appointment, the patient expressed a clear preference to discontinue Zyprexa  due to these side effects. While there is concern for potential re-emergence of psychosis with discontinuation, the risks of ongoing metabolic burden outweigh the perceived benefits at this time. Zyprexa  will therefore be stopped, with explicit education provided to the patient and her mother regarding early warning signs of psychotic worsening and a low threshold to restart or contact the clinic if symptoms recur. Alternative antipsychotic options with lower metabolic liability were discussed; Risperdal is avoided given prior hyperprolactinemia.  Her OCD symptoms remain unchanged, with ongoing contamination behaviors that have not meaningfully improved on Prozac  80 mg. Given partial tolerance and patient understanding, Prozac  will be increased to 100 mg, though expectations for robust response are guarded. This dose remains within evidence-based ranges for OCD, and the patient agrees to a time-limited trial. If symptoms persist, alternative pharmacologic or non-pharmacologic strategies will be revisited.  Overall prognosis is guarded, given diagnostic complexity and limited medication responsiveness to date, but remains potentially favorable with continued adherence, close monitoring during antipsychotic adjustment, and collaborative treatment planning. At the next visit, symptom trajectory following Zyprexa  discontinuation and Prozac  titration will be reassessed; alternative antipsychotic strategies or OCD-focused interventions will be reconsidered if insufficient improvement is observed.   Identifying information: Eileen Rivas is a 23 y.o. y.o. female with a history of schizophrenia and OCD who is an established patient with [facility] for  management of schizophrenia and obsessive thoughts and compulsive behaviors.  The patient's main social support is her mother, Eileen Rivas.  The patient was hospitalized in 2021 at the behavioral hospital for obsessive religious  thoughts and was given the diagnosis of OCD.  She is a former patient of Dr. Marry, who had initiated a combination of Abilify  Maintena, resulting in significant weight gain and with patient still having refractory difficulties with reality testing.  Per Dr. Marry, Zyprexa  was later added on as it was thought that the efficacy advantage was worth the increased risk of metabolic side effects, of note the patient has not responded well to Risperdal in the past (hyperprolactinemia) and SGA's are being avoided due to history of prolactin elevation.  Medication adherence has been improving.  The patient's mother has been administering the patient's medications.   Plan:  # Obsessive-compulsive disorder Interventions: -- Increase Prozac  at 80 mg to 100 mg daily for better control of OCD symptoms  # Schizophrenia Previous medication trials: Prev hyperprolactinemia on risperidone LAI Interventions:  -- Stopped Zyprexa  at patient's request due to complaint of excessive weight gain  --Safety planning completed --Educated patient and patient's mother on resuming medications if psychotic symptoms reemerge - Continue Abilify  maintena 400 mg q28d (last given 08/20/2024)  #Insomnia Interventions: --Restart Trazodone  50 mg nightly    # Antipsychotic induced weight gain -- Quite clear based on timeline: consistently 120 lbs before 04/2020 (her first psychiatric hospitalization). -- Patient continues to express concern about psychological impact of weight gain -- Continue metformin  XR 500 daily   #Long term use of antipsychotic medication -- Non fasting lipid panel on 03/02/2024 obtained at 1:04 PM; Chol 211, Tgs 220, LDL 134, elevated Chol/HDL 5.7 - A1c from  05/28/2023, normal - EKG unremarkable April 2025 (appreciate PCP assistance)   #Elevated TSH, Hypertriglyceridemia - Needs to be addressed by PCP   Patient was given contact information for behavioral health clinic and was instructed to call 911 for emergencies.   Subjective:  Chief Complaint:  Chief Complaint  Patient presents with   Follow-up   Medication Problem    Interval History:  Patient reports that her OCD symptoms have not improved despite taking her medications daily, which are administered by her mother. She describes minimal improvement overall and states that she continues to avoid the kitchen and the trash can, noting that contamination concerns appear unchanged from prior visits.  She reports a desire to discontinue Zyprexa  due to ongoing weight gain, and she describes that her mother, who is present during the visit, shares concern about the weight gain but agrees to restart Zyprexa  if psychotic symptoms worsen or reemerge. She reports no current hallucinations or other symptoms of psychosis at this visit.  She denies suicidal ideation, denies homicidal ideation, and denies auditory or visual hallucinations. She reports amenability to further titration of Prozac  and states understanding that a transition to a different SSRI may be discussed if there is inadequate response to titration, given a prior supratherapeutic dose trial.      Visit Diagnosis:    ICD-10-CM   1. Obsessive-compulsive disorder, unspecified type  F42.9 FLUoxetine  (PROZAC ) 40 MG capsule    FLUoxetine  (PROZAC ) 20 MG capsule    2. Insomnia, unspecified type  G47.00 traZODone  (DESYREL ) 50 MG tablet       Past Psychiatric History: As above  Past Medical History:  Past Medical History:  Diagnosis Date   Anxiety    Depression    Obsessive-compulsive disorder    No past surgical history on file.  Family Psychiatric History: None pertinent  Family History: No family history on  file.  Social History:  Social History   Socioeconomic History   Marital status:  Single    Spouse name: Not on file   Number of children: 0   Years of education: Not on file   Highest education level: Not on file  Occupational History   Occupation: student    Comment: [employer]  Tobacco Use   Smoking status: Never    Passive exposure: Never   Smokeless tobacco: Never  Vaping Use   Vaping status: Never Used  Substance and Sexual Activity   Alcohol use: Yes    Comment: occassional   Drug use: No   Sexual activity: Never  Other Topics Concern   Not on file  Social History Narrative   Not on file   Social Drivers of Health   Tobacco Use: Low Risk (09/24/2024)   Patient History    Smoking Tobacco Use: Never    Smokeless Tobacco Use: Never    Passive Exposure: Never  Financial Resource Strain: Low Risk (07/24/2022)   Overall Financial Resource Strain (CARDIA)    Difficulty of Paying Living Expenses: Not hard at all  Food Insecurity: Not on File (07/18/2023)   Received from [facility] Insecurity    Food: 0  Transportation Needs: No Transportation Needs (07/24/2022)   PRAPARE - Administrator, Civil Service (Medical): No    Lack of Transportation (Non-Medical): No  Physical Activity: Inactive (07/24/2022)   Exercise Vital Sign    Days of Exercise per Week: 0 days    Minutes of Exercise per Session: 0 min  Stress: Stress Concern Present (07/24/2022)   [employer] of Occupational Health - Occupational Stress Questionnaire    Feeling of Stress : To some extent  Social Connections: Not on File (07/16/2023)   Received from [facility]   Social Connections    Connectedness: 0  Depression (PHQ2-9): High Risk (04/23/2024)   Depression (PHQ2-9)    PHQ-2 Score: 17  Alcohol Screen: Not on file  Housing: Low Risk (07/24/2022)   Housing    Last Housing Risk Score: 0  Utilities: Not At Risk (07/24/2022)   AHC Utilities    Threatened with loss of utilities: No   Health Literacy: Not on file    Allergies: No Known Allergies  Current Medications: Current Outpatient Medications  Medication Sig Dispense Refill   FLUoxetine  (PROZAC ) 20 MG capsule Take 1 capsule (20 mg total) by mouth daily. 60 capsule 0   ARIPiprazole  ER (ABILIFY  MAINTENA) 400 MG PRSY prefilled syringe Inject 400 mg into the muscle every 28 (twenty-eight) days. 1 each 11   atorvastatin  (LIPITOR) 80 MG tablet Take 1 tablet (80 mg total) by mouth daily. 90 tablet 1   FLUoxetine  (PROZAC ) 20 MG capsule Take 1 capsule (20 mg total) by mouth daily. 30 capsule 3   FLUoxetine  (PROZAC ) 40 MG capsule Take 1 capsule (40 mg total) by mouth daily. Take with 20mg . 30 capsule 2   FLUoxetine  (PROZAC ) 40 MG capsule Take 2 capsules (80 mg total) by mouth daily. 60 capsule 0   metFORMIN  (GLUCOPHAGE -XR) 500 MG 24 hr tablet Take 1 tablet (500 mg total) by mouth daily with breakfast. 90 tablet 0   OLANZapine  (ZYPREXA ) 5 MG tablet Take 1 tablet (5 mg total) by mouth at bedtime. 30 tablet 2   traZODone  (DESYREL ) 50 MG tablet Take 1 tablet (50 mg total) by mouth at bedtime. 30 tablet 1   Current Facility-Administered Medications  Medication Dose Route Frequency Provider Last Rate Last Admin   ARIPiprazole  ER (ABILIFY  MAINTENA) 400 MG prefilled syringe 400 mg  400 mg Intramuscular Q28 days Harl Regan E, NP   400 mg at 09/24/24 1353     Objective:  Psychiatric Specialty Exam: Physical Exam Constitutional:      Appearance: the patient is not toxic-appearing.  Pulmonary:     Effort: Pulmonary effort is normal.  Neurological:     General: No focal deficit present.     Mental Status: the patient is alert and oriented to person, place, and time.   Review of Systems  Respiratory:  Negative for shortness of breath.   Cardiovascular:  Negative for chest pain.  Gastrointestinal:  Negative for abdominal pain, constipation, diarrhea, nausea and vomiting.  Neurological:  Negative for headaches.       There were no vitals taken for this visit.  General Appearance: Fairly Groomed   Eye Contact:  Good  Speech:  Clear and Coherent  Volume:  Normal  Mood:  fine  Affect:  Flat  Thought Process:  Coherent  Orientation:  Full (Time, Place, and Person)  Thought Content: no overt psychotic symptoms reported, no delusions, AVH  Suicidal Thoughts:  No  Homicidal Thoughts:  No  Memory:  Immediate;   Good  Judgement:  Fair   Insight:  Limited  Psychomotor Activity:  Normal  Concentration:  Concentration: Good  Recall:  Good  Fund of Knowledge: Good  Language: Good  Akathisia:  No  Handed:    AIMS (if indicated): not done  Assets:  Communication Skills Desire for Improvement Financial Resources/Insurance Housing Leisure Time Physical Health  ADL's:  Intact  Cognition: WNL       Metabolic Disorder Labs: Lab Results  Component Value Date   HGBA1C 5.5 05/28/2023   Lab Results  Component Value Date   PROLACTIN 16.5 05/28/2023   Lab Results  Component Value Date   CHOL 211 (H) 03/02/2024   TRIG 220 (H) 03/02/2024   HDL 37 (L) 03/02/2024   CHOLHDL 5.7 (H) 03/02/2024   LDLCALC 134 (H) 03/02/2024   LDLCALC 82 05/28/2023   Lab Results  Component Value Date   TSH 4.910 (H) 03/02/2024   TSH 5.190 (H) 05/28/2023    Therapeutic Level Labs: No results found for: LITHIUM No results found for: VALPROATE No results found for: CBMZ  Screenings: AIMS    Flowsheet Row Clinical Support from 09/25/2022 in [facility] Clinical Support from 08/08/2022 in [facility] Office Visit from 07/24/2022 in [facility]  AIMS Total Score 0 1 2 0   AUDIT    Flowsheet Row Ad[facility]charged[facility]  Alcohol Use Disorder Identification Test Final Score  (AUDIT) 0   GAD-7    Flowsheet Row Office Visit from 02/18/2024 in [facility] Office Visit from 08/20/2023 in [facility]  Total GAD-7 Score 7 17   PHQ2-9    Flowsheet Row Counselor from 04/23/2024 in [facility] Office Visit from 02/18/2024 in [facility] Office Visit from 08/20/2023 in [facility] Counselor from 07/25/2023 in [facility] Office Visit from 07/24/2022 in [place]  PHQ-2 Total Score 5 3 5 6 4   PHQ-9 Total Score 17 14 20 19 13    Flowsheet Row UC from 12/31/2023 in [facility] ED from 11/08/2020 in  [facility] Admission (Discharged[facility]  C-SSRS RISK CATEGORY Moderate Risk High Risk No Risk    Collaboration of Care: none  A total of 30 minutes was spent involved in face to face clinical care, chart review, documentation.   Eileen Masson, MD 10/09/2024, 2:00 PM

## 2024-10-29 ENCOUNTER — Ambulatory Visit (HOSPITAL_COMMUNITY): Payer: MEDICAID

## 2024-11-11 ENCOUNTER — Encounter (HOSPITAL_COMMUNITY): Payer: Self-pay

## 2024-11-11 ENCOUNTER — Ambulatory Visit (INDEPENDENT_AMBULATORY_CARE_PROVIDER_SITE_OTHER): Payer: MEDICAID

## 2024-11-11 VITALS — BP 112/69 | HR 84 | Ht 61.5 in | Wt 190.2 lb

## 2024-11-11 DIAGNOSIS — F200 Paranoid schizophrenia: Secondary | ICD-10-CM | POA: Diagnosis not present

## 2024-11-11 NOTE — Progress Notes (Cosign Needed)
 Patient presented to office for injection of Abilify  Maintena 400 mg. Patient received injection in Right deltoid. Patient tolerated injection well. Patient denies SI/HI/AVH. Patient reports concerns with weight gain and opted to discuss with provider during next appointment. Patient will return in 28 days.

## 2024-11-26 ENCOUNTER — Ambulatory Visit (HOSPITAL_COMMUNITY): Payer: MEDICAID | Admitting: Student in an Organized Health Care Education/Training Program

## 2024-11-26 ENCOUNTER — Encounter (HOSPITAL_COMMUNITY): Payer: Self-pay | Admitting: Student in an Organized Health Care Education/Training Program

## 2024-11-26 VITALS — BP 112/63 | HR 69 | Ht 61.5 in | Wt 188.0 lb

## 2024-11-26 DIAGNOSIS — R635 Abnormal weight gain: Secondary | ICD-10-CM

## 2024-11-26 DIAGNOSIS — G4700 Insomnia, unspecified: Secondary | ICD-10-CM

## 2024-11-26 DIAGNOSIS — F429 Obsessive-compulsive disorder, unspecified: Secondary | ICD-10-CM

## 2024-11-26 MED ORDER — TRAZODONE HCL 50 MG PO TABS: 100.0000 mg | ORAL_TABLET | Freq: Every day | ORAL | 2 refills | Status: AC

## 2024-11-26 MED ORDER — METFORMIN HCL ER 500 MG PO TB24: 500.0000 mg | ORAL_TABLET | Freq: Every day | ORAL | 0 refills | Status: AC

## 2024-11-26 MED ORDER — FLUOXETINE HCL 20 MG PO CAPS: 20.0000 mg | ORAL_CAPSULE | Freq: Every day | ORAL | 0 refills | Status: AC

## 2024-11-26 MED ORDER — FLUOXETINE HCL 40 MG PO CAPS: 80.0000 mg | ORAL_CAPSULE | Freq: Every day | ORAL | 0 refills | Status: AC

## 2024-11-26 NOTE — Progress Notes (Unsigned)
 [facility] MD Outpatient Progress Note  11/26/2024 7:42 PM Eileen Rivas  MRN:  [id number]  Virtual Visit via Telephone Note I connected with Eileen Rivas on 11/26/24 at  1:00 PM EST by a video enabled telemedicine application and verified that I am speaking with the correct person using two identifiers.  Location: Patient: Home Provider: Office   I discussed the limitations, risks, security and privacy concerns of performing an evaluation and management service by telephone and the availability of in person appointments. I also discussed with the patient that there may be a patient responsible charge related to this service. The patient expressed understanding and agreed to proceed.   I discussed the assessment and treatment plan with the patient. The patient was provided an opportunity to ask questions and all were answered. The patient agreed with the plan and demonstrated an understanding of the instructions.   The patient was advised to call back or seek an in-person evaluation if the symptoms worsen or if the condition fails to improve as anticipated.   Marlo Masson, MD Psych Resident, PGY-3    Assessment:  Eileen Rivas presents for follow-up evaluation on 11/26/24.  The patient is alone for this visit, her sister drove her to the appointment.  At this visit, the patient reports worsening insomnia, which is likely contributing to exacerbation of OCD symptoms and may worsen psychotic features if left unaddressed. Trazodone  was restarted to target sleep disruption.  The patient continues to experience obsessive-compulsive symptoms. Contamination-related obsessions appear improved; however, obsessive thought processes were observed during the visit, including repeated concern about whether a clinic door had been closed upon leaving. Since the recent decrease in Zyprexa , the patient has lost two pounds. At this time, further medication titration is not indicated. Medication adherence  has improved with her mother's support.  Worsening insomnia likely contributes to intrusive thoughts and may lower tolerance for obsessive and perceptual symptoms. Given this formulation, further antipsychotic titration is unlikely to provide additional benefit and carries unnecessary metabolic risk. Given the role of sleep disturbance as a contributing factor, trazodone  was restarted to address insomnia, with the goal of mitigating downstream exacerbation of OCD and psychotic-spectrum symptoms.  Socially, the patient remains largely isolated and primarily interacts online. Coordination with a peer support specialist was discussed to explore appropriate in-person or community-based socialization resources tailored to her diagnoses.   Identifying information: Eileen Rivas is a 24 y.o. y.o. female with a history of schizophrenia and OCD who is an established patient with [facility] for management of schizophrenia and obsessive thoughts and compulsive behaviors.  The patient's main social support is her mother, [name].  The patient was hospitalized in 2021 at the behavioral hospital for obsessive religious thoughts and was given the diagnosis of OCD.  She is a former patient of Dr. Marry, who had initiated a combination of Abilify  Maintena, resulting in significant weight gain and with patient still having refractory difficulties with reality testing.  Per Dr. Marry, Zyprexa  was later added on as it was thought that the efficacy advantage was worth the increased risk of metabolic side effects, of note the patient has not responded well to Risperdal in the past (hyperprolactinemia) and SGA's are being avoided due to history of prolactin elevation.  Medication adherence has been improving.  The patient's mother has been administering the patient's medications.   Plan:  # Obsessive-compulsive disorder Interventions: --Continue Prozac  100 mg daily   #  Schizophrenia Previous medication trials: Prev hyperprolactinemia on risperidone LAI, Zyprexa   discontinued due to excessive weight gain Interventions:  - Continue Abilify  maintena 400 mg q28d (last given 08/20/2024)  #Insomnia Interventions: --Restart Trazodone  50 mg nightly    # Antipsychotic induced weight gain -- Quite clear based on timeline: consistently 120 lbs before 04/2020 (her first psychiatric hospitalization). -- Patient continues to express concern about psychological impact of weight gain -- Continue metformin  XR 500 daily   #Long term use of antipsychotic medication -- Non fasting lipid panel on 03/02/2024 obtained at 1:04 PM; Chol 211, Tgs 220, LDL 134, elevated Chol/HDL 5.7 - A1c from 05/28/2023, normal - EKG unremarkable April 2025 (appreciate PCP assistance)   #Elevated TSH, Hypertriglyceridemia - Needs to be addressed by PCP   Patient was given contact information for behavioral health clinic and was instructed to call 911 for emergencies.   Subjective:  Chief Complaint:  Chief Complaint  Patient presents with   Follow-up    Interval History:  The patient reports that things have been bad at times and that she previously felt unable to speak with me, though she notes she has recently been more open. She reports communicating with someone through [employer], an app used to meet strangers online, and feels she shared her feelings too quickly. She expresses concern that her memory is worsening. She also reports increased fixations on [employer], with a strong urge to show them to family members and significant anxiety if family does not watch them. Contamination obsessions remain present but are reported as improved, with less fixation on trash. She describes a newfound increased focus on brushing her teeth. She reports medication adherence, with her mother administering medications. Sleep is described as poor, with a lot of trouble sleeping and averaging about four  hours per night, with inability to return to sleep after awakening. She reports snoring. She denies alcohol, tobacco, and cannabis use. She denies suicidal and homicidal ideation. She denies visual hallucinations and reports intermittent auditory experiences described as hearing beach waves.    Visit Diagnosis:    ICD-10-CM   1. Obsessive-compulsive disorder, unspecified type  F42.9 FLUoxetine  (PROZAC ) 40 MG capsule    FLUoxetine  (PROZAC ) 20 MG capsule    2. Insomnia, unspecified type  G47.00 traZODone  (DESYREL ) 50 MG tablet    3. Weight gain due to medication  R63.5 metFORMIN  (GLUCOPHAGE -XR) 500 MG 24 hr tablet   T50.905A         Past Psychiatric History: As above  Past Medical History:  Past Medical History:  Diagnosis Date   Anxiety    Depression    Obsessive-compulsive disorder    No past surgical history on file.  Family Psychiatric History: None pertinent  Family History: No family history on file.  Social History:  Social History   Socioeconomic History   Marital status: Single    Spouse name: Not on file   Number of children: 0   Years of education: Not on file   Highest education level: Not on file  Occupational History   Occupation: student    Comment: [employer]  Tobacco Use   Smoking status: Never    Passive exposure: Never   Smokeless tobacco: Never  Vaping Use   Vaping status: Never Used  Substance and Sexual Activity   Alcohol use: Yes    Comment: occassional   Drug use: No   Sexual activity: Never  Other Topics Concern   Not on file  Social History Narrative   Not on file   Social Drivers of Health   Tobacco  Use: Low Risk (11/26/2024)   Patient History    Smoking Tobacco Use: Never    Smokeless Tobacco Use: Never    Passive Exposure: Never  Financial Resource Strain: Low Risk (07/24/2022)   Overall Financial Resource Strain (CARDIA)    Difficulty of Paying Living Expenses: Not hard at all  Food Insecurity: Not on File  (07/18/2023)   Received from [facility] Insecurity    Food: 0  Transportation Needs: No Transportation Needs (07/24/2022)   PRAPARE - Administrator, Civil Service (Medical): No    Lack of Transportation (Non-Medical): No  Physical Activity: Inactive (07/24/2022)   Exercise Vital Sign    Days of Exercise per Week: 0 days    Minutes of Exercise per Session: 0 min  Stress: Stress Concern Present (07/24/2022)   [employer] of Occupational Health - Occupational Stress Questionnaire    Feeling of Stress : To some extent  Social Connections: Not on File (07/16/2023)   [facility] from [facility]   Social Connections    Connectedness: 0  Depression (PHQ2-9): High Risk (04/23/2024)   Depression (PHQ2-9)    PHQ-2 Score: 17  Alcohol Screen: Not on file  Housing: Low Risk (07/24/2022)   Housing    Last Housing Risk Score: 0  Utilities: Not At Risk (07/24/2022)   [facility] Utilities    Threatened with loss of utilities: No  Health Literacy: Not on file    Allergies: No Known Allergies  Current Medications: Current Outpatient Medications  Medication Sig Dispense Refill   FLUoxetine  (PROZAC ) 20 MG capsule Take 1 capsule (20 mg total) by mouth daily. Take together with Prozac  80 mg tablet for 100 mg total daily 90 capsule 0   FLUoxetine  (PROZAC ) 40 MG capsule Take 2 capsules (80 mg total) by mouth daily. Take together with Prozac  20 mg tablet for 100 mg total daily 180 capsule 0   ARIPiprazole  ER (ABILIFY  MAINTENA) 400 MG PRSY prefilled syringe Inject 400 mg into the muscle every 28 (twenty-eight) days. 1 each 11   atorvastatin  (LIPITOR) 80 MG tablet Take 1 tablet (80 mg total) by mouth daily. 90 tablet 1   metFORMIN  (GLUCOPHAGE -XR) 500 MG 24 hr tablet Take 1 tablet (500 mg total) by mouth daily with breakfast. 90 tablet 0   traZODone  (DESYREL ) 50 MG tablet Take 2 tablets (100 mg total) by mouth at bedtime. 30 tablet 2   Current Facility-Administered Medications  Medication Dose Route  Frequency Provider Last Rate Last Admin   ARIPiprazole  ER (ABILIFY  MAINTENA) 400 MG prefilled syringe 400 mg  400 mg Intramuscular Q28 days Parsons, Brittney E, NP   400 mg at 11/11/24 1521     Objective:  Psychiatric Specialty Exam: Physical Exam Constitutional:      Appearance: the patient is not toxic-appearing.  Pulmonary:     Effort: Pulmonary effort is normal.  Neurological:     General: No focal deficit present.     Mental Status: the patient is alert and oriented to person, place, and time.   Review of Systems  Respiratory:  Negative for shortness of breath.   Cardiovascular:  Negative for chest pain.  Gastrointestinal:  Negative for abdominal pain, constipation, diarrhea, nausea and vomiting.  Neurological:  Negative for headaches.      BP 112/63   Pulse 69   Ht 5' 1.5 (1.562 m)   Wt 188 lb (85.3 kg)   BMI 34.95 kg/m   General Appearance: Fairly Groomed   Eye Contact:  Good  Speech:  Clear and Coherent  Volume:  Normal  Mood:  as above  Affect:  Flat  Thought Process:  Ruminative, overall linear  Orientation:  Full (Time, Place, and Person)  Thought Content: Obsessions; no overt psychotic symptoms reported, no delusions, AVH  Suicidal Thoughts:  No  Homicidal Thoughts:  No  Memory:  Immediate;   Good  Judgement:  Fair   Insight:  Limited  Psychomotor Activity:  Normal  Concentration:  Concentration: Good  Recall:  Good  Fund of Knowledge: Good  Language: Good  Akathisia:  No  Handed:    AIMS (if indicated): not done  Assets:  Communication Skills Desire for Improvement Financial Resources/Insurance Housing Leisure Time Physical Health  ADL's:  Intact  Cognition: WNL       Metabolic Disorder Labs: Lab Results  Component Value Date   HGBA1C 5.5 05/28/2023   Lab Results  Component Value Date   PROLACTIN 16.5 05/28/2023   Lab Results  Component Value Date   CHOL 211 (H) 03/02/2024   TRIG 220 (H) 03/02/2024   HDL 37 (L) 03/02/2024    CHOLHDL 5.7 (H) 03/02/2024   LDLCALC 134 (H) 03/02/2024   LDLCALC 82 05/28/2023   Lab Results  Component Value Date   TSH 4.910 (H) 03/02/2024   TSH 5.190 (H) 05/28/2023    Therapeutic Level Labs: No results found for: LITHIUM No results found for: VALPROATE No results found for: CBMZ  Screenings: AIMS    Flowsheet Row Clinical Support from 09/25/2022 in [facility] Clinical Support from 08/08/2022 in [facility] Office Visit from 07/24/2022 in [facility]  AIMS Total Score 0 1 2 0   AUDIT    Flowsheet Row Ad[facility]Discharged[facility]  Alcohol Use Disorder Identification Test Final Score (AUDIT) 0   GAD-7    Flowsheet Row Office Visit from 02/18/2024 in [facility] Office Visit from 08/20/2023 in [facility]  Total GAD-7 Score 7 17   PHQ2-9    Flowsheet Row Counselor from 04/23/2024 in [facility] Office Visit from 02/18/2024 in [facility] Office Visit from 08/20/2023 in [facility] Counselor from 07/25/2023 in [facility] Office Visit from 07/24/2022 in [facility]  PHQ-2 Total Score 5 3 5 6 4   PHQ-9 Total Score 17 14 20 19 13    Flowsheet Row UC from 12/31/2023 in [facility] ED from 11/08/2020 in [facility] Admission (Discharged) [facility]  C-SSRS RISK CATEGORY Moderate Risk High Risk No Risk    Collaboration of Care: none  A total of 30 minutes was spent involved in face to face clinical care, chart review, documentation.    Marlo Masson, MD 11/26/2024, 7:42 PM

## 2024-12-09 ENCOUNTER — Ambulatory Visit (HOSPITAL_COMMUNITY): Payer: MEDICAID

## 2025-01-07 ENCOUNTER — Encounter (HOSPITAL_COMMUNITY): Payer: MEDICAID | Admitting: Student in an Organized Health Care Education/Training Program
